# Patient Record
Sex: Female | Born: 1940 | Race: White | Hispanic: No | Marital: Married | Smoking: Never smoker
Health system: Southern US, Community
[De-identification: ages and names within clinical notes are randomized; demographics above are authoritative.]

## PROBLEM LIST (undated history)

## (undated) DIAGNOSIS — E079 Disorder of thyroid, unspecified: Secondary | ICD-10-CM

## (undated) DIAGNOSIS — R0600 Dyspnea, unspecified: Secondary | ICD-10-CM

## (undated) DIAGNOSIS — R011 Cardiac murmur, unspecified: Secondary | ICD-10-CM

## (undated) DIAGNOSIS — C801 Malignant (primary) neoplasm, unspecified: Secondary | ICD-10-CM

## (undated) DIAGNOSIS — F32A Depression, unspecified: Secondary | ICD-10-CM

## (undated) DIAGNOSIS — I214 Non-ST elevation (NSTEMI) myocardial infarction: Secondary | ICD-10-CM

## (undated) DIAGNOSIS — F419 Anxiety disorder, unspecified: Secondary | ICD-10-CM

## (undated) DIAGNOSIS — F329 Major depressive disorder, single episode, unspecified: Secondary | ICD-10-CM

## (undated) DIAGNOSIS — I2510 Atherosclerotic heart disease of native coronary artery without angina pectoris: Secondary | ICD-10-CM

## (undated) DIAGNOSIS — H9190 Unspecified hearing loss, unspecified ear: Secondary | ICD-10-CM

## (undated) DIAGNOSIS — E039 Hypothyroidism, unspecified: Secondary | ICD-10-CM

## (undated) DIAGNOSIS — M1990 Unspecified osteoarthritis, unspecified site: Secondary | ICD-10-CM

## (undated) DIAGNOSIS — E785 Hyperlipidemia, unspecified: Secondary | ICD-10-CM

## (undated) HISTORY — PX: APPENDECTOMY: SHX54

## (undated) HISTORY — DX: Hyperlipidemia, unspecified: E78.5

## (undated) HISTORY — DX: Dyspnea, unspecified: R06.00

## (undated) HISTORY — DX: Disorder of thyroid, unspecified: E07.9

## (undated) HISTORY — PX: CHOLECYSTECTOMY: SHX55

## (undated) HISTORY — DX: Anxiety disorder, unspecified: F41.9

---

## 2007-12-22 ENCOUNTER — Ambulatory Visit: Payer: Self-pay | Admitting: Obstetrics & Gynecology

## 2007-12-22 ENCOUNTER — Other Ambulatory Visit: Admission: RE | Admit: 2007-12-22 | Discharge: 2007-12-22 | Payer: Self-pay | Admitting: Obstetrics & Gynecology

## 2007-12-22 ENCOUNTER — Encounter: Payer: Self-pay | Admitting: Obstetrics & Gynecology

## 2008-01-08 ENCOUNTER — Ambulatory Visit: Payer: Self-pay | Admitting: Obstetrics & Gynecology

## 2008-01-22 ENCOUNTER — Ambulatory Visit: Payer: Self-pay | Admitting: Obstetrics & Gynecology

## 2008-04-05 ENCOUNTER — Encounter: Payer: Self-pay | Admitting: Obstetrics & Gynecology

## 2008-04-05 ENCOUNTER — Ambulatory Visit (HOSPITAL_COMMUNITY): Admission: RE | Admit: 2008-04-05 | Discharge: 2008-04-05 | Payer: Self-pay | Admitting: Obstetrics & Gynecology

## 2008-04-05 ENCOUNTER — Ambulatory Visit: Payer: Self-pay | Admitting: Obstetrics & Gynecology

## 2008-05-04 ENCOUNTER — Ambulatory Visit: Payer: Self-pay | Admitting: Obstetrics & Gynecology

## 2010-06-06 LAB — CBC
HCT: 41.4 % (ref 36.0–46.0)
Hemoglobin: 14 g/dL (ref 12.0–15.0)
MCHC: 33.9 g/dL (ref 30.0–36.0)
MCV: 91.1 fL (ref 78.0–100.0)
Platelets: 211 10*3/uL (ref 150–400)
RBC: 4.55 MIL/uL (ref 3.87–5.11)
RDW: 13.6 % (ref 11.5–15.5)
WBC: 6.8 10*3/uL (ref 4.0–10.5)

## 2010-07-04 NOTE — Op Note (Signed)
NAME:  Bonnie Hancock, Bonnie Hancock                ACCOUNT NO.:  [id number]   MEDICAL RECORD NO.:  [id number]          PATIENT TYPE:  AMB   LOCATION:  [facility]                           FACILITY:  [facility]   PHYSICIAN:  Allie Bossier, MD        DATE OF BIRTH:  20-Jan-1941   DATE OF PROCEDURE:  04/05/2008  DATE OF DISCHARGE:                               OPERATIVE REPORT   PREOPERATIVE DIAGNOSIS:  Postmenopausal bleeding.   POSTOPERATIVE DIAGNOSES:  1. Postmenopausal bleeding.  2. Obesity.   PROCEDURE:  Hysteroscopy, dilation and curettage.   SURGEON:  Myra C. Marice Potter, MD   ANESTHESIA:  LMA.   COMPLICATIONS:  None.   ESTIMATED BLOOD LOSS:  Minimal.   SPECIMENS:  Uterine curettings.   FINDINGS:  Polyp.   DETAILS OF PROCEDURE AND FINDINGS:  The risks, benefits, and  alternatives of surgery were explained, understood, and accepted.  Consents were signed.  She was taken to the operating room and general  anesthesia was applied without complication.  She was placed in dorsal  lithotomy position and her vagina was prepped and draped in usual  sterile fashion.  A bladder was emptied with a Robinson catheter.  A  weighted speculum was placed posteriorly and a Deaver anteriorly.  This  was done after bimanual exam revealed a normal size and shape mid plane  uterus and nonenlarged adnexa.  A single-tooth tenaculum was placed in  the anterior lip of the cervix.  The cervix was amazingly easy to dilate  using Pratt dilators up to a 23-French to accommodate a diagnostic  hysteroscope.  Hysteroscopy revealed an atrophic endometrium throughout  with the exception of an anterior wall uterine polyp.  Using a sharp  curettage, the polyp was removed.  Hysteroscopy confirmed complete  removal of the polyp.  Tenaculum was removed.  No bleeding was noted  from the tenaculum site or the cervix.  She was extubated and taken to  recovery room in stable condition.  The instrument, sponge, and needle  counts were  correct.      Allie Bossier, MD  Electronically Signed    MCD/MEDQ  D:  04/05/2008  T:  04/06/2008  Job:  [id number]

## 2010-07-04 NOTE — Assessment & Plan Note (Signed)
NAME:  NEVAEN, TREDWAY NO.:  [id number]   MEDICAL RECORD NO.:  [id number]          PATIENT TYPE:  POB   LOCATION:  [facility]         FACILITY:  [facility]   PHYSICIAN:  Allie Bossier, MD        DATE OF BIRTH:  1940/11/22   DATE OF SERVICE:  12/22/2007                                  CLINIC NOTE   IDENTIFICATION:  Ms. Spath is a 70 year old married white gravida 4,  para 4 (with 15+ grand kids) who is here for annual exam.  Her complaint  is that of an episode of postmenopausal bleeding 3-4 months ago.  She  has not had this evaluated.  Of note, her last Pap smear was probably  around the year 2003 and her last mammogram around the year 2004.  With  regard to colonoscopy she says, I have never had it, I'm not going  to.Marland Kitchen   PAST MEDICAL HISTORY:  Morbid obesity, high cholesterol (she says no  medicines).  She is not able to tolerate any of the medicines for this.   OTHER MEDICAL PROBLEM:  Mixed urinary incontinence and postmenopausal  bleeding.   REVIEW OF SYSTEMS:  She has seen a chiropractor in the past for her  lower back pain, but not recently.  Her internal medicine doctor is Dr.  __________ Nicholes Rough.  She has been married (third marriage) for the  last 23 years.  She is sexually active and uses a lubricant as  necessary.   PAST SURGICAL HISTORY:  Appendectomy and tubal ligation.   FAMILY HISTORY:  Negative for breast, GYN, and colon malignancies.   SOCIAL HISTORY:  Negative for tobacco, alcohol, or drug use.   ALLERGIES:  No known drugs allergies.  No latex allergies.   PHYSICAL EXAMINATION:  VITAL SIGNS:  Weight 200, height 5 feet 2 inches,  blood pressure 153/86, pulse 71.  HEENT:  Normal.  HEART:  Regular rate and rhythm.  LUNGS:  Clear to auscultation bilaterally.  BREAST:  Normal.  No skin lesions, nipple discharge, or masses.  ABDOMEN:  Obese, benign.  No palpable hepatosplenomegaly.  EXTERNAL GENITALIA:  Remarkably little irritation,  as it is obvious that  she leaks urine.  Vagina with moderate amount of atrophy with normal  cervix, parous, no lesions.  On the anterior vaginal wall just above the  portion of the cervix there is a discolored area of the vaginal skin.  (I did a Pap smear of this area for cytology).  On bimanual exam uterus  feels slightly enlarged.  Her adnexa are not palpable and there is no  tenderness on any part on the bimanual exam.   ASSESSMENT AND PLAN:  1. Annual exam, check the Pap smear of the uncertain vaginal area and      the cervix.  2. Recommended self-breast exam, monthly.  She is aware that weight      loss would be advantageous to her.  3. Postmenopausal bleeding, 3 months ago.  I will schedule a GYN      ultrasound for further evaluation and I have scheduled a mammogram.      Allie Bossier, MD  MCD/MEDQ  D:  12/22/2007  T:  12/23/2007  Job:  [id number]

## 2010-08-14 ENCOUNTER — Ambulatory Visit: Payer: Self-pay | Admitting: Internal Medicine

## 2010-08-29 ENCOUNTER — Encounter: Payer: Self-pay | Admitting: Cardiovascular Disease

## 2010-08-29 ENCOUNTER — Ambulatory Visit (INDEPENDENT_AMBULATORY_CARE_PROVIDER_SITE_OTHER): Payer: Medicare Other | Admitting: Cardiovascular Disease

## 2010-08-29 VITALS — BP 158/82 | HR 73 | Ht 62.0 in | Wt 197.0 lb

## 2010-08-29 DIAGNOSIS — E785 Hyperlipidemia, unspecified: Secondary | ICD-10-CM

## 2010-08-29 DIAGNOSIS — E782 Mixed hyperlipidemia: Secondary | ICD-10-CM | POA: Insufficient documentation

## 2010-08-29 DIAGNOSIS — E669 Obesity, unspecified: Secondary | ICD-10-CM | POA: Insufficient documentation

## 2010-08-29 DIAGNOSIS — R0602 Shortness of breath: Secondary | ICD-10-CM | POA: Insufficient documentation

## 2010-08-29 NOTE — Assessment & Plan Note (Signed)
She does report a history of hyperlipidemia. She is uncertain of her numbers. We will try to obtain the most recent labs for our records before making any further recommendations.

## 2010-08-29 NOTE — Assessment & Plan Note (Signed)
Etiology of her shortness of breath is uncertain. Clinically, she appears well with no signs of heart failure. EKG is essentially normal. We will order an echocardiogram to exclude underlying cardiac pathology. If this is normal, we would likely suggest a stress test. We will call her with the results of her echocardiogram. We'll also suggested we check a CBC to make sure that she is not anemic.

## 2010-08-29 NOTE — Patient Instructions (Addendum)
  Please call us if you have new issues that need to be addressed before your next appt.   Your physician has requested that you have an echocardiogram. Echocardiography is a painless test that uses sound waves to create images of your heart. It provides your doctor with information about the size and shape of your heart and how well your heart's chambers and valves are working. This procedure takes approximately one hour. There are no restrictions for this procedure.  Your physician recommends that you return for lab work in: Same day as your Echo (CBC)

## 2010-08-29 NOTE — Assessment & Plan Note (Signed)
She denies any recent change in her weight. She does have underlying obesity. Certainly this could contribute to shortness of breath. We have encouraged continued exercise, careful diet management in an effort to lose weight.

## 2010-08-29 NOTE — Progress Notes (Signed)
   Patient ID: Bonnie Hancock, female    DOB: [birth year], 70 y.o.   MRN: [id number]  HPI Comments: Bonnie Hancock is a 70 year old woman, patient of Dr. Arlana Pouch, presenting by referral for evaluation of shortness of breath.  She reports that she has had shortness of breath for at least one month. It seemed to come on slowly but was certainly a change from before. She was typically very active, able to walk around [employer] without any problems. Reportedly, she had worsening shortness of breath, most notably with exertion. She is unable to walk around as much as before. She denies any significant weight change, no lower extremity edema. No cough. She denies any previous cardiac history or lung problem. She has never smoked. She denies any chest pain, lightheadedness or dizziness. She is able to walk though has to go much more slowly than before.  By report, EKG and previous chest x-ray have been normal. She reports that she sleeps well on her sleeping pill.  EKG today shows normal sinus rhythm with rate 66 beats per minute with no significant ST or T wave changes  Outpatient Encounter Prescriptions as of 08/29/2010  Medication Sig Dispense Refill  . LORazepam (ATIVAN) 1 MG tablet Take 1 mg by mouth every 8 (eight) hours as needed.            Review of Systems  Constitutional: Negative.   HENT: Negative.   Eyes: Negative.   Respiratory: Positive for shortness of breath.   Cardiovascular: Negative.   Gastrointestinal: Negative.   Musculoskeletal: Negative.   Skin: Negative.   Neurological: Negative.   Hematological: Negative.   Psychiatric/Behavioral: Negative.   All other systems reviewed and are negative.    BP 158/82  Pulse 73  Ht 5\' 2"  (1.575 m)  Wt 197 lb (89.359 kg)  BMI 36.03 kg/m2  Physical Exam  Nursing note and vitals reviewed. Constitutional: She is oriented to person, place, and time. She appears well-developed and well-nourished.       Obese   HENT:  Head: Normocephalic.    Nose: Nose normal.  Mouth/Throat: Oropharynx is clear and moist.  Eyes: Conjunctivae are normal. Pupils are equal, round, and reactive to light.  Neck: Normal range of motion. Neck supple. No JVD present.  Cardiovascular: Normal rate, regular rhythm, S1 normal, S2 normal, normal heart sounds and intact distal pulses.  Exam reveals no gallop and no friction rub.   No murmur heard. Pulmonary/Chest: Effort normal and breath sounds normal. No respiratory distress. She has no wheezes. She has no rales. She exhibits no tenderness.  Abdominal: Soft. Bowel sounds are normal. She exhibits no distension. There is no tenderness.  Musculoskeletal: Normal range of motion. She exhibits no edema and no tenderness.  Lymphadenopathy:    She has no cervical adenopathy.  Neurological: She is alert and oriented to person, place, and time. Coordination normal.  Skin: Skin is warm and dry. No rash noted. No erythema.  Psychiatric: She has a normal mood and affect. Her behavior is normal. Judgment and thought content normal.         Assessment and Plan

## 2010-09-05 ENCOUNTER — Other Ambulatory Visit (INDEPENDENT_AMBULATORY_CARE_PROVIDER_SITE_OTHER): Payer: Medicare Other | Admitting: *Deleted

## 2010-09-05 DIAGNOSIS — R0602 Shortness of breath: Secondary | ICD-10-CM

## 2010-09-05 LAB — CBC WITH DIFFERENTIAL/PLATELET
Basophils Absolute: 0 10*3/uL (ref 0.0–0.1)
Basophils Relative: 1 % (ref 0–1)
Eosinophils Absolute: 0.1 10*3/uL (ref 0.0–0.7)
Eosinophils Relative: 2 % (ref 0–5)
HCT: 43.3 % (ref 36.0–46.0)
Hemoglobin: 13.8 g/dL (ref 12.0–15.0)
Lymphocytes Relative: 34 % (ref 12–46)
Lymphs Abs: 2.2 10*3/uL (ref 0.7–4.0)
MCH: 30.4 pg (ref 26.0–34.0)
MCHC: 31.9 g/dL (ref 30.0–36.0)
MCV: 95.4 fL (ref 78.0–100.0)
Monocytes Absolute: 0.5 10*3/uL (ref 0.1–1.0)
Monocytes Relative: 7 % (ref 3–12)
Neutro Abs: 3.7 10*3/uL (ref 1.7–7.7)
Neutrophils Relative %: 56 % (ref 43–77)
Platelets: 217 10*3/uL (ref 150–400)
RBC: 4.54 MIL/uL (ref 3.87–5.11)
RDW: 14 % (ref 11.5–15.5)
WBC: 6.5 10*3/uL (ref 4.0–10.5)

## 2010-09-07 ENCOUNTER — Telehealth: Payer: Self-pay | Admitting: *Deleted

## 2010-09-07 MED ORDER — FUROSEMIDE 20 MG PO TABS: 20.0000 mg | ORAL_TABLET | Freq: Every day | ORAL | Status: DC

## 2010-09-07 MED ORDER — POTASSIUM CHLORIDE ER 10 MEQ PO CPCR: 20.0000 meq | ORAL_CAPSULE | Freq: Every day | ORAL | Status: DC

## 2010-09-07 NOTE — Telephone Encounter (Signed)
Message copied by Annia Belt on Thu Sep 07, 2010  2:14 PM ------      Message from: Phoebe Sharps      Created: Wed Sep 06, 2010  9:07 PM       Echo shows elevated pressures.       This may explain sob.      We should try lasix 20 mg daily, restrict water intake.      Take with potassium.      Monitor weight      CBC is normal. No anemia

## 2010-09-07 NOTE — Telephone Encounter (Signed)
Notified pt of msg below regarding results. She will start lasix 20mg  daily with potassium and will limit fluid intake and monitor weight. Advised pt if she drops significant amount of weight in short period to call the office because we may need to draw labs to make sure she's not too dry. Dr. Mariah Milling, when do you want pt to f/u? And when do you want me to repeat labwork? She does not have a BMP in her chart to compare previous potassium. Thanks.

## 2010-09-08 NOTE — Telephone Encounter (Signed)
Notified patient need to come for a BMET on September 22, 2010 and follow up with Dr. Mariah Milling on September 28, 2010.

## 2010-09-08 NOTE — Telephone Encounter (Signed)
Would maybe follow up in several weeks (2 to 3)

## 2010-09-22 ENCOUNTER — Ambulatory Visit (INDEPENDENT_AMBULATORY_CARE_PROVIDER_SITE_OTHER): Payer: Medicare Other | Admitting: *Deleted

## 2010-09-22 DIAGNOSIS — R0602 Shortness of breath: Secondary | ICD-10-CM

## 2010-09-23 LAB — BASIC METABOLIC PANEL
BUN: 17 mg/dL (ref 6–23)
CO2: 16 mEq/L — ABNORMAL LOW (ref 19–32)
Calcium: 9.6 mg/dL (ref 8.4–10.5)
Chloride: 109 mEq/L (ref 96–112)
Creat: 0.89 mg/dL (ref 0.50–1.10)
Glucose, Bld: 157 mg/dL — ABNORMAL HIGH (ref 70–99)
Potassium: 4.8 mEq/L (ref 3.5–5.3)
Sodium: 141 mEq/L (ref 135–145)

## 2010-09-28 ENCOUNTER — Ambulatory Visit (INDEPENDENT_AMBULATORY_CARE_PROVIDER_SITE_OTHER): Payer: Medicare Other | Admitting: Cardiovascular Disease

## 2010-09-28 ENCOUNTER — Encounter: Payer: Self-pay | Admitting: Cardiovascular Disease

## 2010-09-28 DIAGNOSIS — E785 Hyperlipidemia, unspecified: Secondary | ICD-10-CM

## 2010-09-28 DIAGNOSIS — R0602 Shortness of breath: Secondary | ICD-10-CM

## 2010-09-28 DIAGNOSIS — E669 Obesity, unspecified: Secondary | ICD-10-CM

## 2010-09-28 MED ORDER — PRAVASTATIN SODIUM 20 MG PO TABS: 20.0000 mg | ORAL_TABLET | Freq: Every evening | ORAL | Status: DC

## 2010-09-28 MED ORDER — ASPIRIN EC 81 MG PO TBEC: 81.0000 mg | DELAYED_RELEASE_TABLET | Freq: Every day | ORAL | Status: AC

## 2010-09-28 NOTE — Patient Instructions (Signed)
You are doing well. No medication changes were made. Please call us if you have new issues that need to be addressed before your next appt.  We will call you for a follow up Appt. In 12 months  

## 2010-09-28 NOTE — Progress Notes (Signed)
   Patient ID: Bonnie Hancock, female    DOB: 09-02-1940, 70 y.o.   MRN: [id number]  HPI Comments: Ms. Bonnie Hancock is a 70 year old woman, patient of Dr. Arlana Pouch, With shortness of breath with exertion, obesity, presenting for routine followup.  She is currently on Lasix with potassium. She's had no significant decrease in her weight. She does go out to eat frequently but does not add additional salt to her food. Her weight continues to be a problem. She does not do any regular exercise. She tries to keep up with her husband who is recovering from bypass surgery. He is doing more than her and she has to stop secondary to shortness of breath. "I have always been heavy".  She denies any significant lower extremity edema. Shortness of breath is about the same. No smoking history  Echocardiogram done in July 2012 shows normal LV systolic function, normal valvular function with no significant disease, mildly elevated right ventricular systolic pressures consistent with mild pulmonary hypertension.  Old EKG today shows normal sinus rhythm with rate 66 beats per minute with no significant ST or T wave changes  Outpatient Encounter Prescriptions as of 09/28/2010  Medication Sig Dispense Refill  . furosemide (LASIX) 20 MG tablet Take 1 tablet (20 mg total) by mouth daily.  30 tablet  6  . LORazepam (ATIVAN) 1 MG tablet Take 1 mg by mouth every 8 (eight) hours as needed.        . potassium chloride (MICRO-K) 10 MEQ CR capsule Take 2 capsules (20 mEq total) by mouth daily.  60 capsule  6    Review of Systems  Constitutional: Negative.   HENT: Negative.   Eyes: Negative.   Respiratory: Positive for shortness of breath.   Cardiovascular: Negative.   Gastrointestinal: Negative.   Musculoskeletal: Negative.   Skin: Negative.   Neurological: Negative.   Hematological: Negative.   Psychiatric/Behavioral: Negative.   All other systems reviewed and are negative.    BP 140/74  Pulse 81  Ht 5\' 2"  (1.575 m)   Wt 197 lb (89.359 kg)  BMI 36.03 kg/m2  Physical Exam  Nursing note and vitals reviewed. Constitutional: She is oriented to person, place, and time. She appears well-developed and well-nourished.       Obese   HENT:  Head: Normocephalic.  Nose: Nose normal.  Mouth/Throat: Oropharynx is clear and moist.  Eyes: Conjunctivae are normal. Pupils are equal, round, and reactive to light.  Neck: Normal range of motion. Neck supple. No JVD present.  Cardiovascular: Normal rate, regular rhythm, S1 normal, S2 normal, normal heart sounds and intact distal pulses.  Exam reveals no gallop and no friction rub.   No murmur heard. Pulmonary/Chest: Effort normal and breath sounds normal. No respiratory distress. She has no wheezes. She has no rales. She exhibits no tenderness.  Abdominal: Soft. Bowel sounds are normal. She exhibits no distension. There is no tenderness.  Musculoskeletal: Normal range of motion. She exhibits no edema and no tenderness.  Lymphadenopathy:    She has no cervical adenopathy.  Neurological: She is alert and oriented to person, place, and time. Coordination normal.  Skin: Skin is warm and dry. No rash noted. No erythema.  Psychiatric: She has a normal mood and affect. Her behavior is normal. Judgment and thought content normal.         Assessment and Plan

## 2010-09-28 NOTE — Assessment & Plan Note (Signed)
I believe her shortness of breath is multifactorial. She is deconditioned, obese and has mildly elevated right ventricular systolic pressures. She denies any snoring or sleep apnea. She is on a diuretic but no significant change in her weight to suggest even mild diuresis. I suggested she watch her salt intake and fluid intake in an effort to decrease her weight by several pounds. This could help her breathing.   I've also strongly suggested she continue her exercise as her husband does his cardiac rehabilitation. She needs to work on her weight and deconditioning. This would likely help her shortness of breath.  If she has worsening symptoms, worsening shortness of breath, we have suggested she contact us. We could then schedule her for a stress test. She will be low risk for coronary artery disease as she is not a smoker, not a diabetic.

## 2010-09-28 NOTE — Assessment & Plan Note (Signed)
She is willing to try low-dose pravastatin. He suggested she start 10 mg daily for several weeks for titrating to 20 mg daily.

## 2010-09-28 NOTE — Assessment & Plan Note (Signed)
We have encouraged continued exercise, careful diet management in an effort to lose weight. 

## 2011-01-24 ENCOUNTER — Ambulatory Visit: Payer: Self-pay | Admitting: Internal Medicine

## 2011-03-01 ENCOUNTER — Telehealth: Payer: Self-pay | Admitting: Cardiovascular Disease

## 2011-03-01 NOTE — Telephone Encounter (Signed)
Patient states that she is having "kidney pain" and wants to discuss her medications.

## 2011-03-02 NOTE — Telephone Encounter (Signed)
Spoke to pt this AM, her medlist is updated. C/o left flank pain for past 2 days, pain is intermittent and worse with movement.  Pt asked if could be related to any of her meds. She has not started anything new recently. Her fluid intake is good she says, no h/o kidney stone, she has had kidney infection in past and thinks this may be cause. She says she has f/u scheduled with Dr. Arlana Pouch today at 2pm and they will call our office if needed for any cardiac reason.

## 2011-03-07 ENCOUNTER — Ambulatory Visit: Payer: Self-pay | Admitting: Internal Medicine

## 2011-04-10 ENCOUNTER — Ambulatory Visit: Payer: Self-pay | Admitting: Urology

## 2011-05-01 ENCOUNTER — Other Ambulatory Visit: Payer: Self-pay | Admitting: Cardiovascular Disease

## 2011-09-11 ENCOUNTER — Ambulatory Visit: Payer: Self-pay | Admitting: General Surgery

## 2011-09-25 ENCOUNTER — Ambulatory Visit (INDEPENDENT_AMBULATORY_CARE_PROVIDER_SITE_OTHER): Payer: Medicare Other | Admitting: Cardiovascular Disease

## 2011-09-25 ENCOUNTER — Encounter: Payer: Self-pay | Admitting: Cardiovascular Disease

## 2011-09-25 VITALS — BP 140/70 | HR 72 | Ht 62.0 in | Wt 200.2 lb

## 2011-09-25 DIAGNOSIS — R0602 Shortness of breath: Secondary | ICD-10-CM

## 2011-09-25 DIAGNOSIS — E669 Obesity, unspecified: Secondary | ICD-10-CM

## 2011-09-25 DIAGNOSIS — R0989 Other specified symptoms and signs involving the circulatory and respiratory systems: Secondary | ICD-10-CM

## 2011-09-25 DIAGNOSIS — E785 Hyperlipidemia, unspecified: Secondary | ICD-10-CM

## 2011-09-25 DIAGNOSIS — R0609 Other forms of dyspnea: Secondary | ICD-10-CM

## 2011-09-25 DIAGNOSIS — R0600 Dyspnea, unspecified: Secondary | ICD-10-CM

## 2011-09-25 NOTE — Assessment & Plan Note (Signed)
She reports that she has tried statins in the past. She will try red yeast rice.

## 2011-09-25 NOTE — Progress Notes (Signed)
   Patient ID: Bonnie Hancock, female    DOB: 1940/11/24, 71 y.o.   MRN: [id number]  HPI Comments: Bonnie Hancock is a 71 year old woman, patient of Dr. Arlana Pouch, With history of shortness of breath with exertion, obesity, presenting for routine followup.  She is currently on Lasix twice a day with potassium. She's had no significant decrease in her weight. Her weight continues to be a problem. She does not do any regular exercise.  She reports that her shortness of breath is better than on her previous visit, possibly from the diuretic. She denies any significant lower extremity edema. No smoking history.  Echocardiogram done in July 2012 shows normal LV systolic function, normal valvular function with no significant disease, mildly elevated right ventricular systolic pressures consistent with mild pulmonary hypertension.   EKG today shows normal sinus rhythm with rate 67 beats per minute with no significant ST or T wave changes  Outpatient Encounter Prescriptions as of 09/25/2011  Medication Sig Dispense Refill  . aspirin EC 81 MG tablet Take 1 tablet (81 mg total) by mouth daily.  150 tablet  2  . furosemide (LASIX) 20 MG tablet Take 20 mg by mouth 2 (two) times daily.      Marland Kitchen LORazepam (ATIVAN) 1 MG tablet Take 1 mg by mouth every 8 (eight) hours as needed.        . potassium chloride (MICRO-K) 10 MEQ CR capsule TAKE TWO CAPSULES BY MOUTH EVERY DAY  60 capsule  5  . furosemide (LASIX) 20 MG tablet Take 20 mg by mouth 2 (two) times daily.        Review of Systems  Constitutional: Negative.   HENT: Negative.   Eyes: Negative.   Respiratory: Positive for shortness of breath.   Cardiovascular: Negative.   Gastrointestinal: Negative.   Musculoskeletal: Negative.   Skin: Negative.   Neurological: Negative.   Hematological: Negative.   Psychiatric/Behavioral: Negative.   All other systems reviewed and are negative.    BP 140/70  Pulse 72  Ht 5\' 2"  (1.575 m)  Wt 200 lb 4 oz (90.833 kg)  BMI  36.63 kg/m2  Physical Exam  Nursing note and vitals reviewed. Constitutional: She is oriented to person, place, and time. She appears well-developed and well-nourished.       Obese   HENT:  Head: Normocephalic.  Nose: Nose normal.  Mouth/Throat: Oropharynx is clear and moist.  Eyes: Conjunctivae are normal. Pupils are equal, round, and reactive to light.  Neck: Normal range of motion. Neck supple. No JVD present.  Cardiovascular: Normal rate, regular rhythm, S1 normal, S2 normal, normal heart sounds and intact distal pulses.  Exam reveals no gallop and no friction rub.   No murmur heard. Pulmonary/Chest: Effort normal and breath sounds normal. No respiratory distress. She has no wheezes. She has no rales. She exhibits no tenderness.  Abdominal: Soft. Bowel sounds are normal. She exhibits no distension. There is no tenderness.  Musculoskeletal: Normal range of motion. She exhibits no edema and no tenderness.  Lymphadenopathy:    She has no cervical adenopathy.  Neurological: She is alert and oriented to person, place, and time. Coordination normal.  Skin: Skin is warm and dry. No rash noted. No erythema.  Psychiatric: She has a normal mood and affect. Her behavior is normal. Judgment and thought content normal.         Assessment and Plan

## 2011-09-25 NOTE — Assessment & Plan Note (Signed)
Shortness of breath has improved on Lasix twice a day. She reports that she has followup with him for routine blood work. I suspect she has a diastolic dysfunction and diastolic CHF, improved on Lasix.

## 2011-09-25 NOTE — Assessment & Plan Note (Signed)
We have encouraged continued exercise, careful diet management in an effort to lose weight. 

## 2011-09-25 NOTE — Patient Instructions (Addendum)
You are doing well. No medication changes were made. Try RED YEAST RICE  2 to 4 a day for cholesterol   Please call us if you have new issues that need to be addressed before your next appt.  Your physician wants you to follow-up in: 12 months.  You will receive a reminder letter in the mail two months in advance. If you don't receive a letter, please call our office to schedule the follow-up appointment.

## 2011-11-04 ENCOUNTER — Other Ambulatory Visit: Payer: Self-pay | Admitting: Cardiovascular Disease

## 2011-11-05 ENCOUNTER — Other Ambulatory Visit: Payer: Self-pay | Admitting: *Deleted

## 2011-11-05 MED ORDER — POTASSIUM CHLORIDE ER 10 MEQ PO CPCR: ORAL_CAPSULE | ORAL | Status: DC

## 2011-11-05 NOTE — Telephone Encounter (Signed)
Refilled Potassium

## 2012-05-05 ENCOUNTER — Other Ambulatory Visit: Payer: Self-pay | Admitting: Cardiovascular Disease

## 2012-05-05 NOTE — Telephone Encounter (Signed)
Refilled Potassium sent to [facility].

## 2012-07-23 ENCOUNTER — Other Ambulatory Visit: Payer: Self-pay | Admitting: Cardiovascular Disease

## 2012-07-30 ENCOUNTER — Other Ambulatory Visit: Payer: Self-pay

## 2012-07-30 MED ORDER — POTASSIUM CHLORIDE ER 10 MEQ PO CPCR: ORAL_CAPSULE | ORAL | Status: DC

## 2012-07-30 NOTE — Telephone Encounter (Signed)
Refilled potassium 10 meq sent to [facility] pharmacy.

## 2012-09-22 ENCOUNTER — Ambulatory Visit (INDEPENDENT_AMBULATORY_CARE_PROVIDER_SITE_OTHER): Payer: Medicare Other | Admitting: Cardiovascular Disease

## 2012-09-22 ENCOUNTER — Encounter: Payer: Self-pay | Admitting: Cardiovascular Disease

## 2012-09-22 VITALS — BP 134/74 | HR 59 | Ht 62.0 in | Wt 198.8 lb

## 2012-09-22 DIAGNOSIS — E669 Obesity, unspecified: Secondary | ICD-10-CM

## 2012-09-22 DIAGNOSIS — E785 Hyperlipidemia, unspecified: Secondary | ICD-10-CM

## 2012-09-22 DIAGNOSIS — R0602 Shortness of breath: Secondary | ICD-10-CM

## 2012-09-22 MED ORDER — FENOFIBRATE 160 MG PO TABS: 160.0000 mg | ORAL_TABLET | Freq: Every day | ORAL | Status: DC

## 2012-09-22 NOTE — Patient Instructions (Addendum)
You are doing well. Please start fenofibrate one a day  Please call us if you have new issues that need to be addressed before your next appt.  Your physician wants you to follow-up in: 12 months.  You will receive a reminder letter in the mail two months in advance. If you don't receive a letter, please call our office to schedule the follow-up appointment.

## 2012-09-22 NOTE — Progress Notes (Signed)
Patient ID: Bonnie Hancock, female    DOB: Jun 15, 1940, 72 y.o.   MRN: [id number]  HPI Comments: Bonnie Hancock is a 72 year old woman, patient of Dr. Arlana Pouch, With history of shortness of breath with exertion, obesity, presenting for routine followup.  She is currently on Lasix twice a day with potassium. Weight continues to be a problem. Since taking the Lasix, her shortness of breath has improved.   She does not do any regular exercise.  She denies any significant lower extremity edema. No smoking history.  She was unable to tolerate any statins. Recent lab work shows total cholesterol 295, LDL 215, TSH 5  Echocardiogram done in July 2012 shows normal LV systolic function, normal valvular function with no significant disease, mildly elevated right ventricular systolic pressures consistent with mild pulmonary hypertension.   EKG today shows normal sinus rhythm with rate 59 beats per minute with no significant ST or T wave changes  Outpatient Encounter Prescriptions as of 09/22/2012  Medication Sig Dispense Refill  . aspirin 81 MG tablet Take 81 mg by mouth daily.      Marland Kitchen escitalopram (LEXAPRO) 10 MG tablet Take 5 mg by mouth daily.      . furosemide (LASIX) 20 MG tablet Take 20 mg by mouth 2 (two) times daily.      Marland Kitchen LORazepam (ATIVAN) 1 MG tablet Take 0.5 mg by mouth every 8 (eight) hours as needed.       . Melatonin 5 MG CAPS Take 5 mg by mouth at bedtime.      . potassium chloride (MICRO-K) 10 MEQ CR capsule Take 10 mEq by mouth 2 (two) times daily.      . [DISCONTINUED] potassium chloride (MICRO-K) 10 MEQ CR capsule TAKE TWO CAPSULES BY MOUTH ONCE DAILY  60 capsule  3  . [DISCONTINUED] furosemide (LASIX) 20 MG tablet Take 20 mg by mouth 2 (two) times daily.       No facility-administered encounter medications on file as of 09/22/2012.     Review of Systems  Constitutional: Negative.   HENT: Negative.   Eyes: Negative.   Respiratory: Positive for shortness of breath.   Cardiovascular:  Negative.   Gastrointestinal: Negative.   Musculoskeletal: Negative.   Skin: Negative.   Neurological: Negative.   Psychiatric/Behavioral: Negative.   All other systems reviewed and are negative.    BP 134/74  Pulse 59  Ht 5\' 2"  (1.575 m)  Wt 198 lb 12 oz (90.152 kg)  BMI 36.34 kg/m2  Physical Exam  Nursing note and vitals reviewed. Constitutional: She is oriented to person, place, and time. She appears well-developed and well-nourished.  Obese   HENT:  Head: Normocephalic.  Nose: Nose normal.  Mouth/Throat: Oropharynx is clear and moist.  Eyes: Conjunctivae are normal. Pupils are equal, round, and reactive to light.  Neck: Normal range of motion. Neck supple. No JVD present.  Cardiovascular: Normal rate, regular rhythm, S1 normal, S2 normal, normal heart sounds and intact distal pulses.  Exam reveals no gallop and no friction rub.   No murmur heard. Pulmonary/Chest: Effort normal and breath sounds normal. No respiratory distress. She has no wheezes. She has no rales. She exhibits no tenderness.  Abdominal: Soft. Bowel sounds are normal. She exhibits no distension. There is no tenderness.  Musculoskeletal: Normal range of motion. She exhibits no edema and no tenderness.  Lymphadenopathy:    She has no cervical adenopathy.  Neurological: She is alert and oriented to person, place, and time. Coordination normal.  Skin:  Skin is warm and dry. No rash noted. No erythema.  Psychiatric: She has a normal mood and affect. Her behavior is normal. Judgment and thought content normal.    Assessment and Plan

## 2012-09-22 NOTE — Assessment & Plan Note (Signed)
We have encouraged continued exercise, careful diet management in an effort to lose weight. 

## 2012-09-22 NOTE — Assessment & Plan Note (Signed)
Talked to her about her hyperlipidemia today. Encouraged diet, weight loss. She does not want a statin. Discussed other options including fenofibrate, zetia, and WelChol or colestipol. She is willing to start on fenofibrate once daily. If well tolerated, could then try colestipol (may be cheaper than WelChol).

## 2012-09-22 NOTE — Assessment & Plan Note (Signed)
Shortness of breath is mild, chronic, stable. No further workup at this time. No changes from her prior clinic visit.

## 2013-01-05 ENCOUNTER — Other Ambulatory Visit: Payer: Self-pay | Admitting: *Deleted

## 2013-01-05 ENCOUNTER — Other Ambulatory Visit: Payer: Self-pay | Admitting: Cardiovascular Disease

## 2013-01-05 MED ORDER — POTASSIUM CHLORIDE ER 10 MEQ PO CPCR: 10.0000 meq | ORAL_CAPSULE | Freq: Two times a day (BID) | ORAL | Status: DC

## 2013-01-05 NOTE — Telephone Encounter (Signed)
Requested Prescriptions   Signed Prescriptions Disp Refills  . potassium chloride (MICRO-K) 10 MEQ CR capsule 60 capsule 3    Sig: Take 1 capsule (10 mEq total) by mouth 2 (two) times daily.    Authorizing Provider: Antonieta Iba    Ordering User: Kendrick Fries

## 2013-03-09 ENCOUNTER — Telehealth: Payer: Self-pay

## 2013-03-09 NOTE — Telephone Encounter (Signed)
Patient needs to have a different type of potassium pill sent in because the potassium she is taking now will cost her $56.00 a month where she did pay $8.00 month. Please advise what other potassium she can take?

## 2013-03-10 ENCOUNTER — Other Ambulatory Visit: Payer: Self-pay

## 2013-03-10 MED ORDER — POTASSIUM CHLORIDE ER 10 MEQ PO CPCR: 10.0000 meq | ORAL_CAPSULE | Freq: Two times a day (BID) | ORAL | Status: DC

## 2013-03-10 NOTE — Telephone Encounter (Signed)
I'm calling the patient's insurance to find out what the problem is with the patient's potassium pill and what other potassium pill does the pharmacy recommend she take.

## 2013-03-10 NOTE — Telephone Encounter (Signed)
Refill sent for potassium chloride (micro K)

## 2013-03-10 NOTE — Telephone Encounter (Signed)
Please review not sure what other potassium pill she is needing. Please advise.

## 2013-03-10 NOTE — Telephone Encounter (Signed)
Spoke w/ pt regarding high potassium foods that she can increase in her diet until her K+ supplements are called in, as she reports that she will take her last pill today. Pt reports that she is fond of many of the foods and has most of them in her house, so it will be easy for her to increase these over the next few days.

## 2013-03-10 NOTE — Telephone Encounter (Signed)
Notified patient per her insurance company the new insurance for this year is showing that needs to meet her deductible first and then once she has met her deductible, the price of the potassium will go back to a cheaper price.

## 2013-03-10 NOTE — Telephone Encounter (Signed)
Pt called back, inquiring about her rx. States she is completely out of her medicine. Please call. Ok to leave message on vm.

## 2013-07-15 ENCOUNTER — Telehealth: Payer: Self-pay

## 2013-07-15 NOTE — Telephone Encounter (Signed)
Pt called, received a recall letter, states she is having dizziness, pain between her shoulder blades, "hurting in my chest, sorta pain like", states she has had heartburn also. Please call.

## 2013-07-15 NOTE — Telephone Encounter (Signed)
Spoke w/ pt.  She reports that last night, she experienced "hurting in my chest some", b/t her shoulder blades and had some heartburn. Reports that she felt weak and tired. She took "something for the reflux", her melatonina and her chol pill, went to sleep and feels better this am. Discussed w/ pt that if sx return at night, she can call EMS to come out and do an EKG or she can call our office anytime to speak w/ someone on call.  Pt will keep her appt to see Dr. Rockey Situ on Mon and call w/ any questions or concerns.

## 2013-07-20 ENCOUNTER — Ambulatory Visit (INDEPENDENT_AMBULATORY_CARE_PROVIDER_SITE_OTHER): Payer: Medicare Other | Admitting: Cardiovascular Disease

## 2013-07-20 ENCOUNTER — Encounter: Payer: Self-pay | Admitting: Cardiovascular Disease

## 2013-07-20 VITALS — BP 140/60 | HR 71 | Ht 62.0 in | Wt 197.5 lb

## 2013-07-20 DIAGNOSIS — R12 Heartburn: Secondary | ICD-10-CM

## 2013-07-20 DIAGNOSIS — M549 Dorsalgia, unspecified: Secondary | ICD-10-CM

## 2013-07-20 DIAGNOSIS — R0602 Shortness of breath: Secondary | ICD-10-CM

## 2013-07-20 DIAGNOSIS — E669 Obesity, unspecified: Secondary | ICD-10-CM

## 2013-07-20 DIAGNOSIS — E785 Hyperlipidemia, unspecified: Secondary | ICD-10-CM

## 2013-07-20 MED ORDER — EZETIMIBE 10 MG PO TABS: 10.0000 mg | ORAL_TABLET | Freq: Every day | ORAL | Status: DC

## 2013-07-20 NOTE — Assessment & Plan Note (Signed)
She takes TUMS as needed. We have suggested if symptoms recur on a regular basis, she could try omeprazole daily

## 2013-07-20 NOTE — Patient Instructions (Addendum)
You are doing well.  Please start zetia one a day for cholesterol  Please call us if you have new issues that need to be addressed before your next appt.  Your physician wants you to follow-up in: 12 months.  You will receive a reminder letter in the mail two months in advance. If you don't receive a letter, please call our office to schedule the follow-up appointment.   

## 2013-07-20 NOTE — Assessment & Plan Note (Signed)
We have encouraged continued exercise, careful diet management in an effort to lose weight. 

## 2013-07-20 NOTE — Progress Notes (Signed)
Patient ID: Bonnie Hancock, female    DOB: 1940/10/03, 73 y.o.   MRN: [id number]  HPI Comments: Bonnie Hancock is a 73 year-old woman, patient of Dr. Hall Busing, With history of shortness of breath with exertion, obesity, presenting for routine followup.  We received a phone call 2 weeks ago reporting heartburn symptoms, pain in her shoulder blades. Symptoms have since resolved. She reports that she is under significant stress. She is taking care of her sister's house and state as her sister is very sick. He feels very stressed out. She does not do any regular exercise  Weight continues to be a problem. She denies any significant lower extremity edema. No smoking history. She is tolerating fenofibrate.  She was unable to tolerate any statins. Prior lab work shows total cholesterol 295, LDL 215, TSH 5  Echocardiogram done in July 2012 shows normal LV systolic function, normal valvular function with no significant disease, mildly elevated right ventricular systolic pressures consistent with mild pulmonary hypertension.   EKG today shows normal sinus rhythm with rate 71 beats per minute with no significant ST or T wave changes  Outpatient Encounter Prescriptions as of 07/20/2013  Medication Sig  . aspirin 81 MG tablet Take 81 mg by mouth daily.  . fenofibrate 160 MG tablet Take 1 tablet (160 mg total) by mouth daily.  . furosemide (LASIX) 20 MG tablet Take 20 mg by mouth 2 (two) times daily.  Marland Kitchen LORazepam (ATIVAN) 1 MG tablet Take 0.5 mg by mouth every 8 (eight) hours as needed.   . Melatonin 5 MG CAPS Take 5 mg by mouth at bedtime.  . potassium chloride (MICRO-K) 10 MEQ CR capsule TAKE TWO CAPSULES BY MOUTH ONCE DAILY    Review of Systems  Constitutional: Negative.   HENT: Negative.   Eyes: Negative.   Respiratory: Positive for shortness of breath.   Cardiovascular: Negative.   Gastrointestinal: Negative.   Endocrine: Negative.   Musculoskeletal: Negative.   Skin: Negative.    Allergic/Immunologic: Negative.   Neurological: Negative.   Hematological: Negative.   Psychiatric/Behavioral: The patient is nervous/anxious.   All other systems reviewed and are negative.   BP 140/60  Pulse 71  Ht 5\' 2"  (1.575 m)  Wt 197 lb 8 oz (89.585 kg)  BMI 36.11 kg/m2  Physical Exam  Nursing note and vitals reviewed. Constitutional: She is oriented to person, place, and time. She appears well-developed and well-nourished.  Obese   HENT:  Head: Normocephalic.  Nose: Nose normal.  Mouth/Throat: Oropharynx is clear and moist.  Eyes: Conjunctivae are normal. Pupils are equal, round, and reactive to light.  Neck: Normal range of motion. Neck supple. No JVD present.  Cardiovascular: Normal rate, regular rhythm, S1 normal, S2 normal, normal heart sounds and intact distal pulses.  Exam reveals no gallop and no friction rub.   No murmur heard. Pulmonary/Chest: Effort normal and breath sounds normal. No respiratory distress. She has no wheezes. She has no rales. She exhibits no tenderness.  Abdominal: Soft. Bowel sounds are normal. She exhibits no distension. There is no tenderness.  Musculoskeletal: Normal range of motion. She exhibits no edema and no tenderness.  Lymphadenopathy:    She has no cervical adenopathy.  Neurological: She is alert and oriented to person, place, and time. Coordination normal.  Skin: Skin is warm and dry. No rash noted. No erythema.  Psychiatric: She has a normal mood and affect. Her behavior is normal. Judgment and thought content normal.    Assessment and Plan

## 2013-07-20 NOTE — Assessment & Plan Note (Signed)
Back pain likely musculoskeletal in nature. Less likely angina. Symptoms have since resolved

## 2013-07-20 NOTE — Assessment & Plan Note (Signed)
She is tolerating fenofibrate. Suggested she start zetia 10 mg daily

## 2013-10-12 ENCOUNTER — Other Ambulatory Visit: Payer: Self-pay | Admitting: Cardiovascular Disease

## 2014-02-10 ENCOUNTER — Other Ambulatory Visit: Payer: Self-pay | Admitting: Cardiovascular Disease

## 2014-03-08 ENCOUNTER — Other Ambulatory Visit: Payer: Self-pay | Admitting: Cardiovascular Disease

## 2014-07-16 ENCOUNTER — Telehealth: Payer: Self-pay | Admitting: *Deleted

## 2014-07-16 ENCOUNTER — Other Ambulatory Visit: Payer: Self-pay

## 2014-07-16 MED ORDER — EZETIMIBE 10 MG PO TABS: 10.0000 mg | ORAL_TABLET | Freq: Every day | ORAL | Status: DC

## 2014-07-16 NOTE — Telephone Encounter (Signed)
Refill sent for zetia.  

## 2014-07-16 NOTE — Telephone Encounter (Signed)
°  1. Which medications need to be refilled? Zetia  2. Which pharmacy is medication to be sent to? [facility] [address]  3. Do they need a 30 day or 90 day supply? 30   4. Would they like a call back once the medication has been sent to the pharmacy? No

## 2014-07-20 ENCOUNTER — Encounter: Payer: Self-pay | Admitting: Cardiovascular Disease

## 2014-07-20 ENCOUNTER — Ambulatory Visit (INDEPENDENT_AMBULATORY_CARE_PROVIDER_SITE_OTHER): Payer: PPO | Admitting: Cardiovascular Disease

## 2014-07-20 VITALS — BP 120/60 | HR 61 | Ht 62.0 in | Wt 185.5 lb

## 2014-07-20 DIAGNOSIS — R002 Palpitations: Secondary | ICD-10-CM | POA: Diagnosis not present

## 2014-07-20 DIAGNOSIS — R0602 Shortness of breath: Secondary | ICD-10-CM | POA: Diagnosis not present

## 2014-07-20 DIAGNOSIS — E669 Obesity, unspecified: Secondary | ICD-10-CM | POA: Diagnosis not present

## 2014-07-20 DIAGNOSIS — E785 Hyperlipidemia, unspecified: Secondary | ICD-10-CM

## 2014-07-20 NOTE — Progress Notes (Signed)
Patient ID: Bonnie Hancock, female    DOB: Jul 29, 1940, 74 y.o.   MRN: [id number]  HPI Comments: Bonnie Hancock is a 74 year-old woman, patient of Dr. Hall Busing, With history of shortness of breath with exertion, obesity, presenting for routine followup of her hyperlipidemia and shortness of breath  In follow-up today, she reports that she feels she is doing very well. She is active, denies any shortness of breath She takes Lasix twice a day. If she does not take this, she has abdominal bloating, fullness in her chest. Denies any lower extremity edema.  Weight has been decreasing slowly. Down 10 pounds from her prior clinic visit She is tolerating Zetia. Statin intolerant No smoking history.  EKG on today's visit shows normal sinus rhythm with rate 61 bpm, no significant ST or T-wave changes Prior lab work shows total cholesterol 295, LDL 215, TSH 5  Echocardiogram done in July 2012 shows normal LV systolic function, normal valvular function with no significant disease, mildly elevated right ventricular systolic pressures consistent with mild pulmonary hypertension.  Allergies  Allergen Reactions  . Statins     Current Outpatient Prescriptions on File Prior to Visit  Medication Sig Dispense Refill  . aspirin 81 MG tablet Take 81 mg by mouth daily.    Marland Kitchen ezetimibe (ZETIA) 10 MG tablet Take 1 tablet (10 mg total) by mouth daily. 30 tablet 6  . furosemide (LASIX) 20 MG tablet Take 20 mg by mouth 2 (two) times daily.    Marland Kitchen LORazepam (ATIVAN) 1 MG tablet Take 0.5 mg by mouth every 8 (eight) hours as needed.     . Melatonin 5 MG CAPS Take 5 mg by mouth at bedtime.    . potassium chloride (MICRO-K) 10 MEQ CR capsule TAKE ONE CAPSULE BY MOUTH TWICE DAILY 60 capsule 3   No current facility-administered medications on file prior to visit.    Past Medical History  Diagnosis Date  . Anxiety   . Dyspnea   . Thyroid disease   . Hyperlipidemia     Past Surgical History  Procedure Laterality Date  .  Cholecystectomy    . Appendectomy      Social History  reports that she has never smoked. She does not have any smokeless tobacco history on file. She reports that she drinks alcohol. She reports that she does not use illicit drugs.  Family History family history includes Emphysema in her mother; Heart attack in her father; Heart disease in her mother.        Review of Systems  Constitutional: Negative.   Respiratory: Negative.   Cardiovascular: Negative.   Gastrointestinal: Negative.   Musculoskeletal: Negative.   Neurological: Negative.   Hematological: Negative.   Psychiatric/Behavioral: Negative.   All other systems reviewed and are negative.   BP 120/60 mmHg  Pulse 61  Ht 5\' 2"  (1.575 m)  Wt 185 lb 8 oz (84.142 kg)  BMI 33.92 kg/m2  Physical Exam  Constitutional: She is oriented to person, place, and time. She appears well-developed and well-nourished.  Obese   HENT:  Head: Normocephalic.  Nose: Nose normal.  Mouth/Throat: Oropharynx is clear and moist.  Eyes: Conjunctivae are normal. Pupils are equal, round, and reactive to light.  Neck: Normal range of motion. Neck supple. No JVD present.  Cardiovascular: Normal rate, regular rhythm, S1 normal, S2 normal, normal heart sounds and intact distal pulses.  Exam reveals no gallop and no friction rub.   No murmur heard. Pulmonary/Chest: Effort normal and breath  sounds normal. No respiratory distress. She has no wheezes. She has no rales. She exhibits no tenderness.  Abdominal: Soft. Bowel sounds are normal. She exhibits no distension. There is no tenderness.  Musculoskeletal: Normal range of motion. She exhibits no edema or tenderness.  Lymphadenopathy:    She has no cervical adenopathy.  Neurological: She is alert and oriented to person, place, and time. Coordination normal.  Skin: Skin is warm and dry. No rash noted. No erythema.  Psychiatric: She has a normal mood and affect. Her behavior is normal. Judgment and  thought content normal.    Assessment and Plan  Nursing note and vitals reviewed.

## 2014-07-20 NOTE — Patient Instructions (Signed)
You are doing well. No medication changes were made.  Ok to skip a lasix here and there if not needed  Please call us if you have new issues that need to be addressed before your next appt.  Your physician wants you to follow-up in: 12 months.  You will receive a reminder letter in the mail two months in advance. If you don't receive a letter, please call our office to schedule the follow-up appointment.

## 2014-07-20 NOTE — Assessment & Plan Note (Signed)
We have complemented her on her weight loss Recommended she start a regular exercise program

## 2014-07-20 NOTE — Assessment & Plan Note (Signed)
She feels her shortness of breath is good on the Lasix. No signs of congestive heart failure No recent lab work available. Followed by primary care

## 2014-07-20 NOTE — Assessment & Plan Note (Signed)
Statin intolerant She is tolerating zetia  no longer on fenofibrate.

## 2015-02-24 ENCOUNTER — Telehealth: Payer: Self-pay

## 2015-02-24 NOTE — Telephone Encounter (Signed)
Prior auth for Zetia 10mg  Generic, sent to [facility].

## 2015-02-25 ENCOUNTER — Telehealth: Payer: Self-pay | Admitting: Cardiovascular Disease

## 2015-02-25 NOTE — Telephone Encounter (Signed)
New Message  Prior Auth  Will need the ICD-10 code. Will also need form filled in so that the prior auth can be reviewed.  Please call

## 2015-02-28 NOTE — Telephone Encounter (Signed)
Approved.  

## 2015-03-29 DIAGNOSIS — H5203 Hypermetropia, bilateral: Secondary | ICD-10-CM | POA: Diagnosis not present

## 2015-03-29 DIAGNOSIS — H2513 Age-related nuclear cataract, bilateral: Secondary | ICD-10-CM | POA: Diagnosis not present

## 2015-03-29 DIAGNOSIS — H43813 Vitreous degeneration, bilateral: Secondary | ICD-10-CM | POA: Diagnosis not present

## 2015-03-29 DIAGNOSIS — H52223 Regular astigmatism, bilateral: Secondary | ICD-10-CM | POA: Diagnosis not present

## 2015-04-06 DIAGNOSIS — E785 Hyperlipidemia, unspecified: Secondary | ICD-10-CM | POA: Diagnosis not present

## 2015-04-06 DIAGNOSIS — E039 Hypothyroidism, unspecified: Secondary | ICD-10-CM | POA: Diagnosis not present

## 2015-04-13 DIAGNOSIS — E039 Hypothyroidism, unspecified: Secondary | ICD-10-CM | POA: Diagnosis not present

## 2015-04-13 DIAGNOSIS — E785 Hyperlipidemia, unspecified: Secondary | ICD-10-CM | POA: Diagnosis not present

## 2015-04-14 ENCOUNTER — Ambulatory Visit
Admission: RE | Admit: 2015-04-14 | Discharge: 2015-04-14 | Disposition: A | Discharge status: Home / Self Care | Payer: PPO | Source: Ambulatory Visit | Attending: Internal Medicine | Admitting: Internal Medicine

## 2015-04-14 ENCOUNTER — Other Ambulatory Visit: Payer: Self-pay | Admitting: Internal Medicine

## 2015-04-14 DIAGNOSIS — M50122 Cervical disc disorder at C5-C6 level with radiculopathy: Secondary | ICD-10-CM | POA: Diagnosis not present

## 2015-04-14 DIAGNOSIS — R05 Cough: Secondary | ICD-10-CM

## 2015-04-14 DIAGNOSIS — M50322 Other cervical disc degeneration at C5-C6 level: Secondary | ICD-10-CM | POA: Diagnosis not present

## 2015-04-14 DIAGNOSIS — M50123 Cervical disc disorder at C6-C7 level with radiculopathy: Secondary | ICD-10-CM | POA: Diagnosis not present

## 2015-04-14 DIAGNOSIS — M542 Cervicalgia: Secondary | ICD-10-CM

## 2015-04-14 DIAGNOSIS — R059 Cough, unspecified: Secondary | ICD-10-CM

## 2015-04-14 DIAGNOSIS — R918 Other nonspecific abnormal finding of lung field: Secondary | ICD-10-CM | POA: Diagnosis not present

## 2015-04-14 DIAGNOSIS — M50121 Cervical disc disorder at C4-C5 level with radiculopathy: Secondary | ICD-10-CM | POA: Insufficient documentation

## 2015-07-21 ENCOUNTER — Encounter: Payer: Self-pay | Admitting: Cardiovascular Disease

## 2015-07-21 ENCOUNTER — Ambulatory Visit (INDEPENDENT_AMBULATORY_CARE_PROVIDER_SITE_OTHER): Payer: PPO | Admitting: Cardiovascular Disease

## 2015-07-21 ENCOUNTER — Encounter (INDEPENDENT_AMBULATORY_CARE_PROVIDER_SITE_OTHER): Payer: Self-pay

## 2015-07-21 VITALS — BP 130/68 | HR 67 | Ht 62.0 in | Wt 190.2 lb

## 2015-07-21 DIAGNOSIS — R531 Weakness: Secondary | ICD-10-CM

## 2015-07-21 DIAGNOSIS — E785 Hyperlipidemia, unspecified: Secondary | ICD-10-CM | POA: Diagnosis not present

## 2015-07-21 DIAGNOSIS — R0602 Shortness of breath: Secondary | ICD-10-CM

## 2015-07-21 NOTE — Patient Instructions (Signed)
You are doing well. No medication changes were made.  Feel free to take lasix every other day if no swelling or shortness of breath  Please call us if you have new issues that need to be addressed before your next appt.  Your physician wants you to follow-up in: 6 months.  You will receive a reminder letter in the mail two months in advance. If you don't receive a letter, please call our office to schedule the follow-up appointment.

## 2015-07-21 NOTE — Progress Notes (Signed)
Patient ID: Bonnie Hancock, female   DOB: [birth year], 75 y.o.   MRN: [id number] Cardiology Office Note  Date:  07/21/2015   ID:  Bonnie Hancock, DOB Aug 27, 1940, MRN [id number]  PCP:  Albina Billet, MD   Chief Complaint  Patient presents with  . other    1 yr f/u no complaints. Meds reviewed verbally.    HPI:  Bonnie Hancock is a 75 year-old woman, patient of Dr. Hall Busing, With history of shortness of breath with exertion, obesity, presenting for routine followup  Of her shortness of breath   over the course of the past year, she reports that she has been doing relatively well  periodic episodes of weakness, usually associated with exertion, has to sit down and recover  on those episodes will feel hot, wonders if her sugar is running low, swoons.    difficulty today with her car, accelerate her was getting stuck  recent recovery from upper respiratory infection, has left her weaker  she's been taking Lasix 20 mg daily with potassium  continues to take zetia  Once a day  lab work from primary care has been requested  weight continues to be a problem, no exercise program   EKG on today's visit shows normal sinus rhythm with rate 67 bpm, no significant ST or T-wave changes   other past medical history No smoking history.  She was unable to tolerate any statins. Prior lab work shows total cholesterol 295, LDL 215, TSH 5  Echocardiogram done in July 2012 shows normal LV systolic function, normal valvular function with no significant disease, mildly elevated right ventricular systolic pressures consistent with mild pulmonary hypertension.   PMH:   has a past medical history of Anxiety; Dyspnea; Thyroid disease; and Hyperlipidemia.  PSH:    Past Surgical History  Procedure Laterality Date  . Cholecystectomy    . Appendectomy      Current Outpatient Prescriptions  Medication Sig Dispense Refill  . aspirin 81 MG tablet Take 81 mg by mouth daily.    Marland Kitchen escitalopram (LEXAPRO) 10 MG tablet  Take 10 mg by mouth every other day.    . etodolac (LODINE) 500 MG tablet Take 500 mg by mouth 2 (two) times daily.    Marland Kitchen ezetimibe (ZETIA) 10 MG tablet Take 1 tablet (10 mg total) by mouth daily. 30 tablet 6  . furosemide (LASIX) 20 MG tablet Take 20 mg by mouth 2 (two) times daily as needed.    Marland Kitchen levothyroxine (SYNTHROID, LEVOTHROID) 50 MCG tablet Take 50 mcg by mouth daily before breakfast.    . Melatonin 5 MG CAPS Take 5 mg by mouth at bedtime.    . potassium chloride (MICRO-K) 10 MEQ CR capsule Take 1 capsule (10 mEq total) by mouth 2 (two) times daily as needed. 60 capsule 3   No current facility-administered medications for this visit.     Allergies:   Statins   Social History:  The patient  reports that she has never smoked. She does not have any smokeless tobacco history on file. She reports that she drinks alcohol. She reports that she does not use illicit drugs.   Family History:   family history includes Emphysema in her mother; Heart attack in her father; Heart disease in her mother.    Review of Systems: Review of Systems  Constitutional: Positive for malaise/fatigue.  Respiratory: Negative.   Cardiovascular: Negative.   Gastrointestinal: Negative.   Musculoskeletal: Negative.   Neurological: Positive for weakness.  Psychiatric/Behavioral: Negative.  All other systems reviewed and are negative.    PHYSICAL EXAM: VS:  BP 130/68 mmHg  Pulse 67  Ht 5\' 2"  (1.575 m)  Wt 190 lb 4 oz (86.297 kg)  BMI 34.79 kg/m2 , BMI Body mass index is 34.79 kg/(m^2). GEN: Well nourished, well developed, in no acute distress, obese HEENT: normal Neck: no JVD, carotid bruits, or masses Cardiac: RRR; no murmurs, rubs, or gallops,no edema  Respiratory:  clear to auscultation bilaterally, normal work of breathing GI: soft, nontender, nondistended, + BS MS: no deformity or atrophy Skin: warm and dry, no rash Neuro:  Strength and sensation are intact Psych: euthymic mood, full  affect    Recent Labs: No results found for requested labs within last 365 days.   results have been requested   Lipid Panel No results found for: CHOL, HDL, LDLCALC, TRIG   Requested from primary care  Wt Readings from Last 3 Encounters:  07/21/15 190 lb 4 oz (86.297 kg)  07/20/14 185 lb 8 oz (84.142 kg)  07/20/13 197 lb 8 oz (89.585 kg)       ASSESSMENT AND PLAN:  Hyperlipidemia - Plan: EKG 12-Lead We have requested lab work from primary care Unable to tolerate a statin in the past, Will continue on zetia Could consider CT coronary calcium scoring in the future for risk stratification  SOB (shortness of breath) - Mild shortness of breath on exertion, weakness at times Likely from deconditioning Discussed starting a regular exercise program  Morbid obesity due to excess calories ([place]) Strongly recommended dietary changes, walking program for weight loss  Weakness Likely from deconditioning, obesity Difficulty getting down from the exam table today without assistance Discussed starting a regular exercise program  Disposition:   F/U  12 months   Total encounter time more than 15 minutes  Greater than 50% was spent in counseling and coordination of care with the patient    Orders Placed This Encounter  Procedures  . EKG 12-Lead     Signed, Esmond Plants, M.D., Ph.D. 07/21/2015  [place], [place]

## 2015-08-09 ENCOUNTER — Other Ambulatory Visit: Payer: Self-pay | Admitting: Cardiovascular Disease

## 2015-09-21 DIAGNOSIS — M5134 Other intervertebral disc degeneration, thoracic region: Secondary | ICD-10-CM | POA: Diagnosis not present

## 2015-09-21 DIAGNOSIS — M25519 Pain in unspecified shoulder: Secondary | ICD-10-CM | POA: Diagnosis not present

## 2015-09-21 DIAGNOSIS — M542 Cervicalgia: Secondary | ICD-10-CM | POA: Diagnosis not present

## 2015-09-28 DIAGNOSIS — M5030 Other cervical disc degeneration, unspecified cervical region: Secondary | ICD-10-CM | POA: Diagnosis not present

## 2015-09-28 DIAGNOSIS — M5412 Radiculopathy, cervical region: Secondary | ICD-10-CM | POA: Diagnosis not present

## 2015-10-07 DIAGNOSIS — M5030 Other cervical disc degeneration, unspecified cervical region: Secondary | ICD-10-CM | POA: Diagnosis not present

## 2015-10-10 DIAGNOSIS — M5030 Other cervical disc degeneration, unspecified cervical region: Secondary | ICD-10-CM | POA: Diagnosis not present

## 2015-10-17 DIAGNOSIS — M5030 Other cervical disc degeneration, unspecified cervical region: Secondary | ICD-10-CM | POA: Diagnosis not present

## 2015-10-19 DIAGNOSIS — M5412 Radiculopathy, cervical region: Secondary | ICD-10-CM | POA: Diagnosis not present

## 2015-10-19 DIAGNOSIS — M5030 Other cervical disc degeneration, unspecified cervical region: Secondary | ICD-10-CM | POA: Diagnosis not present

## 2015-10-20 ENCOUNTER — Other Ambulatory Visit: Payer: Self-pay | Admitting: Orthopedic Surgery

## 2015-10-20 DIAGNOSIS — M5030 Other cervical disc degeneration, unspecified cervical region: Secondary | ICD-10-CM

## 2015-10-20 DIAGNOSIS — M5412 Radiculopathy, cervical region: Secondary | ICD-10-CM

## 2015-11-02 ENCOUNTER — Ambulatory Visit
Admission: RE | Admit: 2015-11-02 | Discharge: 2015-11-02 | Disposition: A | Discharge status: Home / Self Care | Payer: PPO | Source: Ambulatory Visit | Attending: Orthopedic Surgery | Admitting: Orthopedic Surgery

## 2015-11-02 DIAGNOSIS — M5412 Radiculopathy, cervical region: Secondary | ICD-10-CM | POA: Diagnosis not present

## 2015-11-02 DIAGNOSIS — M50321 Other cervical disc degeneration at C4-C5 level: Secondary | ICD-10-CM | POA: Diagnosis not present

## 2015-11-02 DIAGNOSIS — M4722 Other spondylosis with radiculopathy, cervical region: Secondary | ICD-10-CM | POA: Diagnosis not present

## 2015-11-02 DIAGNOSIS — M5134 Other intervertebral disc degeneration, thoracic region: Secondary | ICD-10-CM | POA: Insufficient documentation

## 2015-11-02 DIAGNOSIS — M5030 Other cervical disc degeneration, unspecified cervical region: Secondary | ICD-10-CM | POA: Diagnosis not present

## 2015-11-02 DIAGNOSIS — M50323 Other cervical disc degeneration at C6-C7 level: Secondary | ICD-10-CM | POA: Diagnosis not present

## 2015-12-08 DIAGNOSIS — M5412 Radiculopathy, cervical region: Secondary | ICD-10-CM | POA: Diagnosis not present

## 2015-12-08 DIAGNOSIS — M5030 Other cervical disc degeneration, unspecified cervical region: Secondary | ICD-10-CM | POA: Diagnosis not present

## 2015-12-08 DIAGNOSIS — M4802 Spinal stenosis, cervical region: Secondary | ICD-10-CM | POA: Diagnosis not present

## 2015-12-10 ENCOUNTER — Other Ambulatory Visit: Payer: Self-pay | Admitting: Cardiovascular Disease

## 2016-01-10 DIAGNOSIS — R05 Cough: Secondary | ICD-10-CM | POA: Diagnosis not present

## 2016-01-10 DIAGNOSIS — J019 Acute sinusitis, unspecified: Secondary | ICD-10-CM | POA: Diagnosis not present

## 2016-01-10 DIAGNOSIS — B9689 Other specified bacterial agents as the cause of diseases classified elsewhere: Secondary | ICD-10-CM | POA: Diagnosis not present

## 2016-03-06 DIAGNOSIS — M5412 Radiculopathy, cervical region: Secondary | ICD-10-CM | POA: Diagnosis not present

## 2016-03-06 DIAGNOSIS — M4802 Spinal stenosis, cervical region: Secondary | ICD-10-CM | POA: Diagnosis not present

## 2016-03-06 DIAGNOSIS — M5030 Other cervical disc degeneration, unspecified cervical region: Secondary | ICD-10-CM | POA: Diagnosis not present

## 2016-04-03 DIAGNOSIS — M5412 Radiculopathy, cervical region: Secondary | ICD-10-CM | POA: Diagnosis not present

## 2016-04-03 DIAGNOSIS — M5030 Other cervical disc degeneration, unspecified cervical region: Secondary | ICD-10-CM | POA: Diagnosis not present

## 2016-04-13 DIAGNOSIS — J029 Acute pharyngitis, unspecified: Secondary | ICD-10-CM | POA: Diagnosis not present

## 2016-04-13 DIAGNOSIS — R079 Chest pain, unspecified: Secondary | ICD-10-CM | POA: Diagnosis not present

## 2016-04-14 ENCOUNTER — Other Ambulatory Visit: Payer: Self-pay | Admitting: Cardiovascular Disease

## 2016-06-29 DIAGNOSIS — M4802 Spinal stenosis, cervical region: Secondary | ICD-10-CM | POA: Diagnosis not present

## 2016-06-29 DIAGNOSIS — M5412 Radiculopathy, cervical region: Secondary | ICD-10-CM | POA: Diagnosis not present

## 2016-06-29 DIAGNOSIS — M5030 Other cervical disc degeneration, unspecified cervical region: Secondary | ICD-10-CM | POA: Diagnosis not present

## 2016-07-08 DIAGNOSIS — J069 Acute upper respiratory infection, unspecified: Secondary | ICD-10-CM | POA: Diagnosis not present

## 2016-07-08 DIAGNOSIS — J019 Acute sinusitis, unspecified: Secondary | ICD-10-CM | POA: Diagnosis not present

## 2016-07-22 NOTE — Progress Notes (Signed)
Patient ID: Bonnie Hancock, female   DOB: April 28, 1940, 76 y.o.   MRN: [id number] Cardiology Office Note  Date:  07/23/2016   ID:  Bonnie Hancock, DOB 1940/07/16, MRN [id number]  PCP:  Albina Billet, MD   Chief Complaint  Patient presents with  . other    6 month follow up. Meds reviewed by the pt. verbally. "doing well."     HPI:   Bonnie Hancock is a 76 year-old woman,  With history of  shortness of breath with exertion,  Chronic diastolic CHF obesity,  presenting for routine followup  Of her shortness of breath  Neck pain,  Having shoulder pain Doing cortisone shots  "gaining fluid" Has been taking Lasix daily Some swelling in her legs and fingers this summer   continues to take zetia  Once a day  Has not seen Dr. Hall Busing in a few years We will call him to schedule an appointment  No regular exercise program Denies any palpitations, chest pain, shortness of breath on exertion   EKG personally reviewed by myself on todays visit Shows normal sinus rhythm with rate 67 bpm no significant ST or T-wave changes   Other past medical history reviewed  periodic episodes of weakness, usually associated with exertion, has to sit down and recover on those episodes will feel hot, wonders if her sugar is running low, swoons.   No smoking history.  She was unable to tolerate any statins. Prior lab work shows total cholesterol 295, LDL 215, TSH 5  Echocardiogram done in July 2012 shows normal LV systolic function, normal valvular function with no significant disease, mildly elevated right ventricular systolic pressures consistent with mild pulmonary hypertension.   PMH:   has a past medical history of Anxiety; Dyspnea; Hyperlipidemia; and Thyroid disease.  PSH:    Past Surgical History:  Procedure Laterality Date  . APPENDECTOMY    . CHOLECYSTECTOMY      Current Outpatient Prescriptions  Medication Sig Dispense Refill  . aspirin 81 MG tablet Take 81 mg by mouth daily.    Marland Kitchen  escitalopram (LEXAPRO) 10 MG tablet Take 10 mg by mouth every other day.    . etodolac (LODINE) 500 MG tablet Take 500 mg by mouth 2 (two) times daily.    Marland Kitchen ezetimibe (ZETIA) 10 MG tablet TAKE ONE TABLET BY MOUTH ONCE DAILY 90 tablet 3  . furosemide (LASIX) 20 MG tablet Take 20 mg by mouth 2 (two) times daily as needed.    Marland Kitchen levothyroxine (SYNTHROID, LEVOTHROID) 50 MCG tablet Take 50 mcg by mouth daily before breakfast.    . Melatonin 5 MG CAPS Take 5 mg by mouth at bedtime.    . potassium chloride (MICRO-K) 10 MEQ CR capsule Take 1 capsule (10 mEq total) by mouth 2 (two) times daily as needed. 60 capsule 3   No current facility-administered medications for this visit.      Allergies:   Statins   Social History:  The patient  reports that she has never smoked. She has never used smokeless tobacco. She reports that she drinks alcohol. She reports that she does not use drugs.   Family History:   family history includes Emphysema in her mother; Heart attack in her father; Heart disease in her mother.    Review of Systems: Review of Systems  Constitutional: Negative.   Respiratory: Negative.   Cardiovascular: Negative.   Gastrointestinal: Negative.   Musculoskeletal: Positive for joint pain and neck pain.  Psychiatric/Behavioral: Negative.  All other systems reviewed and are negative.    PHYSICAL EXAM: VS:  BP 140/60 (BP Location: Left Arm, Patient Position: Sitting, Cuff Size: Normal)   Pulse 67   Ht 5\' 2"  (1.575 m)   Wt 193 lb 12 oz (87.9 kg)   BMI 35.44 kg/m  , BMI Body mass index is 35.44 kg/m.  GEN: Well nourished, well developed, in no acute distress , obese HEENT: normal  Neck: no JVD, carotid bruits, or masses Cardiac: RRR; no murmurs, rubs, or gallops,no edema  Respiratory:  clear to auscultation bilaterally, normal work of breathing GI: soft, nontender, nondistended, + BS MS: no deformity or atrophy  Skin: warm and dry, no rash Neuro:  Strength and sensation are  intact Psych: euthymic mood, full affect    Recent Labs: No results found for requested labs within last 8760 hours.   results have been requested   Lipid Panel No results found for: CHOL, HDL, LDLCALC, TRIG   Requested from primary care  Wt Readings from Last 3 Encounters:  07/23/16 193 lb 12 oz (87.9 kg)  07/21/15 190 lb 4 oz (86.3 kg)  07/20/14 185 lb 8 oz (84.1 kg)       ASSESSMENT AND PLAN:   Hyperlipidemia - Plan: EKG 12-Lead She will follow-up with primary care for lab work Unable to tolerate a statin in the past,  continue on zetia  SOB (shortness of breath) - In the past with Mild shortness of breath on exertion, weakness at times Likely from deconditioning  Chronic diastolic CHF On Lasix daily, likely exacerbated by weight which continues to trend upwards over the past several years. Lab work pending through primary care. Patient call to make appointment  Morbid obesity due to excess calories (Chloride)  recommended dietary changes, walking program for weight loss  Weakness Deconditioned, weight, recommended exercise program  Arthritis Neck, shoulders Receiving cortisone  Disposition:   F/U  12 months   Total encounter time more than 25 minutes  Greater than 50% was spent in counseling and coordination of care with the patient    Orders Placed This Encounter  Procedures  . EKG 12-Lead     Signed, Esmond Plants, M.D., Ph.D. 07/23/2016  [place], [place]

## 2016-07-23 ENCOUNTER — Encounter: Payer: Self-pay | Admitting: Cardiovascular Disease

## 2016-07-23 ENCOUNTER — Ambulatory Visit (INDEPENDENT_AMBULATORY_CARE_PROVIDER_SITE_OTHER): Payer: PPO | Admitting: Cardiovascular Disease

## 2016-07-23 VITALS — BP 140/60 | HR 67 | Ht 62.0 in | Wt 193.8 lb

## 2016-07-23 DIAGNOSIS — M1990 Unspecified osteoarthritis, unspecified site: Secondary | ICD-10-CM

## 2016-07-23 DIAGNOSIS — I5032 Chronic diastolic (congestive) heart failure: Secondary | ICD-10-CM

## 2016-07-23 DIAGNOSIS — R0602 Shortness of breath: Secondary | ICD-10-CM | POA: Diagnosis not present

## 2016-07-23 DIAGNOSIS — E782 Mixed hyperlipidemia: Secondary | ICD-10-CM | POA: Diagnosis not present

## 2016-07-23 NOTE — Patient Instructions (Signed)

## 2016-08-01 DIAGNOSIS — E785 Hyperlipidemia, unspecified: Secondary | ICD-10-CM | POA: Diagnosis not present

## 2016-08-01 DIAGNOSIS — E039 Hypothyroidism, unspecified: Secondary | ICD-10-CM | POA: Diagnosis not present

## 2016-08-08 DIAGNOSIS — E785 Hyperlipidemia, unspecified: Secondary | ICD-10-CM | POA: Diagnosis not present

## 2016-08-08 DIAGNOSIS — E039 Hypothyroidism, unspecified: Secondary | ICD-10-CM | POA: Diagnosis not present

## 2016-08-08 DIAGNOSIS — M5134 Other intervertebral disc degeneration, thoracic region: Secondary | ICD-10-CM | POA: Diagnosis not present

## 2016-08-09 DIAGNOSIS — M5030 Other cervical disc degeneration, unspecified cervical region: Secondary | ICD-10-CM | POA: Diagnosis not present

## 2016-08-09 DIAGNOSIS — M4802 Spinal stenosis, cervical region: Secondary | ICD-10-CM | POA: Diagnosis not present

## 2016-08-09 DIAGNOSIS — M5412 Radiculopathy, cervical region: Secondary | ICD-10-CM | POA: Diagnosis not present

## 2016-08-29 DIAGNOSIS — H43813 Vitreous degeneration, bilateral: Secondary | ICD-10-CM | POA: Diagnosis not present

## 2016-08-29 DIAGNOSIS — H2513 Age-related nuclear cataract, bilateral: Secondary | ICD-10-CM | POA: Diagnosis not present

## 2016-08-29 DIAGNOSIS — H5203 Hypermetropia, bilateral: Secondary | ICD-10-CM | POA: Diagnosis not present

## 2016-08-29 DIAGNOSIS — H52223 Regular astigmatism, bilateral: Secondary | ICD-10-CM | POA: Diagnosis not present

## 2017-01-30 DIAGNOSIS — E039 Hypothyroidism, unspecified: Secondary | ICD-10-CM | POA: Diagnosis not present

## 2017-01-30 DIAGNOSIS — E785 Hyperlipidemia, unspecified: Secondary | ICD-10-CM | POA: Diagnosis not present

## 2017-02-06 DIAGNOSIS — E785 Hyperlipidemia, unspecified: Secondary | ICD-10-CM | POA: Diagnosis not present

## 2017-02-06 DIAGNOSIS — M5134 Other intervertebral disc degeneration, thoracic region: Secondary | ICD-10-CM | POA: Diagnosis not present

## 2017-02-06 DIAGNOSIS — E038 Other specified hypothyroidism: Secondary | ICD-10-CM | POA: Diagnosis not present

## 2017-03-27 DIAGNOSIS — H52223 Regular astigmatism, bilateral: Secondary | ICD-10-CM | POA: Diagnosis not present

## 2017-03-27 DIAGNOSIS — H43813 Vitreous degeneration, bilateral: Secondary | ICD-10-CM | POA: Diagnosis not present

## 2017-03-27 DIAGNOSIS — H5203 Hypermetropia, bilateral: Secondary | ICD-10-CM | POA: Diagnosis not present

## 2017-03-27 DIAGNOSIS — H2513 Age-related nuclear cataract, bilateral: Secondary | ICD-10-CM | POA: Diagnosis not present

## 2017-04-15 ENCOUNTER — Other Ambulatory Visit: Payer: Self-pay | Admitting: Cardiovascular Disease

## 2017-07-18 DIAGNOSIS — H2513 Age-related nuclear cataract, bilateral: Secondary | ICD-10-CM | POA: Diagnosis not present

## 2017-08-09 DIAGNOSIS — E785 Hyperlipidemia, unspecified: Secondary | ICD-10-CM | POA: Diagnosis not present

## 2017-08-09 DIAGNOSIS — E039 Hypothyroidism, unspecified: Secondary | ICD-10-CM | POA: Diagnosis not present

## 2017-08-14 DIAGNOSIS — E038 Other specified hypothyroidism: Secondary | ICD-10-CM | POA: Diagnosis not present

## 2017-08-14 DIAGNOSIS — E785 Hyperlipidemia, unspecified: Secondary | ICD-10-CM | POA: Diagnosis not present

## 2017-08-14 DIAGNOSIS — M5134 Other intervertebral disc degeneration, thoracic region: Secondary | ICD-10-CM | POA: Diagnosis not present

## 2017-09-03 DIAGNOSIS — H2512 Age-related nuclear cataract, left eye: Secondary | ICD-10-CM | POA: Diagnosis not present

## 2017-09-10 ENCOUNTER — Encounter: Payer: Self-pay | Admitting: *Deleted

## 2017-09-17 ENCOUNTER — Ambulatory Visit: Payer: PPO | Admitting: Anesthesiology

## 2017-09-17 ENCOUNTER — Encounter
Admission: RE | Disposition: A | Discharge status: Home / Self Care | Payer: Self-pay | Source: Ambulatory Visit | Attending: Ophthalmology

## 2017-09-17 ENCOUNTER — Ambulatory Visit
Admission: RE | Admit: 2017-09-17 | Discharge: 2017-09-17 | Disposition: A | Discharge status: Home / Self Care | Payer: PPO | Source: Ambulatory Visit | Attending: Ophthalmology | Admitting: Ophthalmology

## 2017-09-17 ENCOUNTER — Other Ambulatory Visit: Payer: Self-pay

## 2017-09-17 ENCOUNTER — Encounter: Payer: Self-pay | Admitting: *Deleted

## 2017-09-17 DIAGNOSIS — F419 Anxiety disorder, unspecified: Secondary | ICD-10-CM | POA: Diagnosis not present

## 2017-09-17 DIAGNOSIS — F329 Major depressive disorder, single episode, unspecified: Secondary | ICD-10-CM | POA: Diagnosis not present

## 2017-09-17 DIAGNOSIS — Z79899 Other long term (current) drug therapy: Secondary | ICD-10-CM | POA: Diagnosis not present

## 2017-09-17 DIAGNOSIS — M47812 Spondylosis without myelopathy or radiculopathy, cervical region: Secondary | ICD-10-CM | POA: Diagnosis not present

## 2017-09-17 DIAGNOSIS — F418 Other specified anxiety disorders: Secondary | ICD-10-CM | POA: Diagnosis not present

## 2017-09-17 DIAGNOSIS — H2512 Age-related nuclear cataract, left eye: Secondary | ICD-10-CM | POA: Insufficient documentation

## 2017-09-17 DIAGNOSIS — E039 Hypothyroidism, unspecified: Secondary | ICD-10-CM | POA: Diagnosis not present

## 2017-09-17 DIAGNOSIS — Z888 Allergy status to other drugs, medicaments and biological substances status: Secondary | ICD-10-CM | POA: Insufficient documentation

## 2017-09-17 DIAGNOSIS — E7800 Pure hypercholesterolemia, unspecified: Secondary | ICD-10-CM | POA: Diagnosis not present

## 2017-09-17 DIAGNOSIS — E782 Mixed hyperlipidemia: Secondary | ICD-10-CM | POA: Diagnosis not present

## 2017-09-17 DIAGNOSIS — Z8542 Personal history of malignant neoplasm of other parts of uterus: Secondary | ICD-10-CM | POA: Diagnosis not present

## 2017-09-17 DIAGNOSIS — Z7982 Long term (current) use of aspirin: Secondary | ICD-10-CM | POA: Insufficient documentation

## 2017-09-17 HISTORY — PX: CATARACT EXTRACTION W/PHACO: SHX586

## 2017-09-17 HISTORY — DX: Hypothyroidism, unspecified: E03.9

## 2017-09-17 HISTORY — DX: Cardiac murmur, unspecified: R01.1

## 2017-09-17 HISTORY — DX: Major depressive disorder, single episode, unspecified: F32.9

## 2017-09-17 HISTORY — DX: Unspecified osteoarthritis, unspecified site: M19.90

## 2017-09-17 HISTORY — DX: Unspecified hearing loss, unspecified ear: H91.90

## 2017-09-17 HISTORY — DX: Depression, unspecified: F32.A

## 2017-09-17 HISTORY — DX: Malignant (primary) neoplasm, unspecified: C80.1

## 2017-09-17 SURGERY — PHACOEMULSIFICATION, CATARACT, WITH IOL INSERTION
Anesthesia: Monitor Anesthesia Care | Site: Eye | Laterality: Left | Wound class: "Clean "

## 2017-09-17 MED ORDER — BSS IO SOLN
INTRAOCULAR | Status: DC | PRN
  Administered 2017-09-17: 2 mL via OPHTHALMIC

## 2017-09-17 MED ORDER — TETRACAINE HCL 0.5 % OP SOLN
OPHTHALMIC | Status: AC
  Filled 2017-09-17: qty 4

## 2017-09-17 MED ORDER — MIDAZOLAM HCL 2 MG/2ML IJ SOLN
INTRAMUSCULAR | Status: DC | PRN
  Administered 2017-09-17: 0.5 mg via INTRAVENOUS

## 2017-09-17 MED ORDER — SODIUM CHLORIDE 0.9 % IV SOLN
INTRAVENOUS | Status: DC
  Administered 2017-09-17: 10:00:00 via INTRAVENOUS

## 2017-09-17 MED ORDER — MOXIFLOXACIN HCL 0.5 % OP SOLN
OPHTHALMIC | Status: AC
  Filled 2017-09-17: qty 3

## 2017-09-17 MED ORDER — MOXIFLOXACIN HCL 0.5 % OP SOLN: 1.0000 [drp] | OPHTHALMIC | Status: DC | PRN

## 2017-09-17 MED ORDER — ONDANSETRON HCL 4 MG/2ML IJ SOLN: 4.0000 mg | Freq: Once | INTRAMUSCULAR | Status: DC | PRN

## 2017-09-17 MED ORDER — PHENYLEPHRINE HCL 10 % OP SOLN
OPHTHALMIC | Status: AC
  Filled 2017-09-17: qty 5

## 2017-09-17 MED ORDER — NA CHONDROIT SULF-NA HYALURON 40-17 MG/ML IO SOLN
INTRAOCULAR | Status: AC
  Filled 2017-09-17: qty 1

## 2017-09-17 MED ORDER — POVIDONE-IODINE 5 % OP SOLN
OPHTHALMIC | Status: AC
  Filled 2017-09-17: qty 30

## 2017-09-17 MED ORDER — EPINEPHRINE PF 1 MG/ML IJ SOLN
INTRAMUSCULAR | Status: AC
  Filled 2017-09-17: qty 1

## 2017-09-17 MED ORDER — CYCLOPENTOLATE HCL 2 % OP SOLN
1.0000 [drp] | OPHTHALMIC | Status: AC
  Administered 2017-09-17: 1 [drp] via OPHTHALMIC
  Administered 2017-09-17: 10:00:00 via OPHTHALMIC
  Administered 2017-09-17 (×2): 1 [drp] via OPHTHALMIC

## 2017-09-17 MED ORDER — LIDOCAINE HCL (PF) 4 % IJ SOLN
INTRAMUSCULAR | Status: AC
  Filled 2017-09-17: qty 5

## 2017-09-17 MED ORDER — POVIDONE-IODINE 5 % OP SOLN
OPHTHALMIC | Status: DC | PRN
  Administered 2017-09-17: 1 via OPHTHALMIC

## 2017-09-17 MED ORDER — NA CHONDROIT SULF-NA HYALURON 40-17 MG/ML IO SOLN
INTRAOCULAR | Status: DC | PRN
  Administered 2017-09-17: 1 mL via INTRAOCULAR

## 2017-09-17 MED ORDER — EPINEPHRINE PF 1 MG/ML IJ SOLN
INTRAOCULAR | Status: DC | PRN
  Administered 2017-09-17: 1 mL via OPHTHALMIC

## 2017-09-17 MED ORDER — TETRACAINE HCL 0.5 % OP SOLN
1.0000 [drp] | Freq: Once | OPHTHALMIC | Status: AC
  Administered 2017-09-17: 09:00:00 via OPHTHALMIC

## 2017-09-17 MED ORDER — CARBACHOL 0.01 % IO SOLN
INTRAOCULAR | Status: DC | PRN
  Administered 2017-09-17: .5 mL via INTRAOCULAR

## 2017-09-17 MED ORDER — CYCLOPENTOLATE HCL 2 % OP SOLN
OPHTHALMIC | Status: AC
  Filled 2017-09-17: qty 2

## 2017-09-17 MED ORDER — MOXIFLOXACIN HCL 0.5 % OP SOLN
OPHTHALMIC | Status: DC | PRN
  Administered 2017-09-17: .2 mL via OPHTHALMIC

## 2017-09-17 MED ORDER — PHENYLEPHRINE HCL 10 % OP SOLN
1.0000 [drp] | OPHTHALMIC | Status: AC
  Administered 2017-09-17 (×2): 1 [drp] via OPHTHALMIC
  Administered 2017-09-17: 10:00:00 via OPHTHALMIC
  Administered 2017-09-17: 1 [drp] via OPHTHALMIC

## 2017-09-17 MED ORDER — FENTANYL CITRATE (PF) 100 MCG/2ML IJ SOLN
INTRAMUSCULAR | Status: DC | PRN
  Administered 2017-09-17: 25 ug via INTRAVENOUS

## 2017-09-17 SURGICAL SUPPLY — 16 items
GLOVE BIO SURGEON STRL SZ8 (GLOVE) ×2 IMPLANT
GLOVE BIOGEL M 6.5 STRL (GLOVE) ×2 IMPLANT
GLOVE SURG LX 8.0 MICRO (GLOVE) ×1
GLOVE SURG LX STRL 8.0 MICRO (GLOVE) ×1 IMPLANT
GOWN STRL REUS W/ TWL LRG LVL3 (GOWN DISPOSABLE) ×2 IMPLANT
GOWN STRL REUS W/TWL LRG LVL3 (GOWN DISPOSABLE) ×2
LABEL CATARACT MEDS ST (LABEL) ×2 IMPLANT
LENS IOL TECNIS ITEC 23.5 (Intraocular Lens) ×1 IMPLANT
PACK CATARACT (MISCELLANEOUS) ×2 IMPLANT
PACK CATARACT BRASINGTON LX (MISCELLANEOUS) ×2 IMPLANT
PACK EYE AFTER SURG (MISCELLANEOUS) ×2 IMPLANT
SOL BSS BAG (MISCELLANEOUS) ×2
SOLUTION BSS BAG (MISCELLANEOUS) ×1 IMPLANT
SYR 5ML LL (SYRINGE) ×2 IMPLANT
WATER STERILE IRR 250ML POUR (IV SOLUTION) ×2 IMPLANT
WIPE NON LINTING 3.25X3.25 (MISCELLANEOUS) ×2 IMPLANT

## 2017-09-17 NOTE — Transfer of Care (Signed)
Immediate Anesthesia Transfer of Care Note  Patient: Bonnie Hancock  Procedure(s) Performed: CATARACT EXTRACTION PHACO AND INTRAOCULAR LENS PLACEMENT (I[facility]) (Left Eye)  Patient Location: PACU  Anesthesia Type:MAC  Level of Consciousness: awake, alert , oriented and patient cooperative  Airway & Oxygen Therapy: Patient Spontanous Breathing  Post-op Assessment: Report given to RN and Post -op Vital signs reviewed and stable  Post vital signs: Reviewed and stable  Last Vitals:  Vitals Value Taken Time  BP    Temp    Pulse    Resp    SpO2      Last Pain:  Vitals:   09/17/17 0910  TempSrc: Oral  PainSc: 0-No pain         Complications: No apparent anesthesia complications

## 2017-09-17 NOTE — H&P (Signed)
All labs reviewed. Abnormal studies sent to patients PCP when indicated.  Previous H&P reviewed, patient examined, there are NO CHANGES.  Bonnie Akre Porfilio7/30/201910:02 AM

## 2017-09-17 NOTE — Anesthesia Postprocedure Evaluation (Signed)
Anesthesia Post Note  Patient: Bonnie Hancock  Procedure(s) Performed: CATARACT EXTRACTION PHACO AND INTRAOCULAR LENS PLACEMENT ([place]) (Left Eye)  Patient location during evaluation: PACU Anesthesia Type: MAC Level of consciousness: awake and alert Pain management: pain level controlled Vital Signs Assessment: post-procedure vital signs reviewed and stable Anesthetic complications: no     Last Vitals:  Vitals:   09/17/17 0910 09/17/17 1029  BP: (!) 138/53   Pulse: 62 63  Resp: 18 18  Temp: (!) 36.1 C 36.6 C  SpO2: 98%     Last Pain:  Vitals:   09/17/17 1029  TempSrc: Temporal  PainSc: 0-No pain                 Bernardo Heater

## 2017-09-17 NOTE — Anesthesia Preprocedure Evaluation (Signed)
Anesthesia Evaluation  Patient identified by MRN, date of birth, ID band Patient awake    Reviewed: Allergy & Precautions, NPO status , Patient's Chart, lab work & pertinent test results  History of Anesthesia Complications Negative for: history of anesthetic complications  Airway Mallampati: III       Dental  (+) Partial Lower   Pulmonary neg sleep apnea,           Cardiovascular (-) hypertension(-) Past MI and (-) CHF (-) dysrhythmias + Valvular Problems/Murmurs      Neuro/Psych neg Seizures Anxiety Depression    GI/Hepatic Neg liver ROS, neg GERD  ,  Endo/Other  neg diabetesHypothyroidism   Renal/GU negative Renal ROS     Musculoskeletal   Abdominal   Peds  Hematology   Anesthesia Other Findings   Reproductive/Obstetrics                             Anesthesia Physical Anesthesia Plan  ASA: III  Anesthesia Plan: MAC   Post-op Pain Management:    Induction: Intravenous  PONV Risk Score and Plan:   Airway Management Planned:   Additional Equipment:   Intra-op Plan:   Post-operative Plan:   Informed Consent: I have reviewed the patients History and Physical, chart, labs and discussed the procedure including the risks, benefits and alternatives for the proposed anesthesia with the patient or authorized representative who has indicated his/her understanding and acceptance.     Plan Discussed with:   Anesthesia Plan Comments:         Anesthesia Quick Evaluation

## 2017-09-17 NOTE — Anesthesia Post-op Follow-up Note (Signed)
Anesthesia QCDR form completed.        

## 2017-09-17 NOTE — Op Note (Signed)
PREOPERATIVE DIAGNOSIS:  Nuclear sclerotic cataract of the left eye.   POSTOPERATIVE DIAGNOSIS:  Nuclear sclerotic cataract of the left eye.   OPERATIVE PROCEDURE: Procedure(s): CATARACT EXTRACTION PHACO AND INTRAOCULAR LENS PLACEMENT (IOC)   SURGEON:  Birder Robson, MD.   ANESTHESIA:  Anesthesiologist: Gunnar Fusi, MD CRNA: Bernardo Heater, CRNA  1.      Managed anesthesia care. 2.     0.40ml of Shugarcaine was instilled following the paracentesis   COMPLICATIONS:  None.   TECHNIQUE:   Stop and chop   DESCRIPTION OF PROCEDURE:  The patient was examined and consented in the preoperative holding area where the aforementioned topical anesthesia was applied to the left eye and then brought back to the Operating Room where the left eye was prepped and draped in the usual sterile ophthalmic fashion and a lid speculum was placed. A paracentesis was created with the side port blade and the anterior chamber was filled with viscoelastic. A near clear corneal incision was performed with the steel keratome. A continuous curvilinear capsulorrhexis was performed with a cystotome followed by the capsulorrhexis forceps. Hydrodissection and hydrodelineation were carried out with BSS on a blunt cannula. The lens was removed in a stop and chop  technique and the remaining cortical material was removed with the irrigation-aspiration handpiece. The capsular bag was inflated with viscoelastic and the Technis ZCB00 lens was placed in the capsular bag without complication. The remaining viscoelastic was removed from the eye with the irrigation-aspiration handpiece. The wounds were hydrated. The anterior chamber was flushed with Miostat and the eye was inflated to physiologic pressure. 0.6ml Vigamox was placed in the anterior chamber. The wounds were found to be water tight. The eye was dressed with Vigamox. The patient was given protective glasses to wear throughout the day and a shield with which to sleep  tonight. The patient was also given drops with which to begin a drop regimen today and will follow-up with me in one day. Implant Name Type Inv. Item Serial No. Manufacturer Lot No. LRB No. Used  LENS IOL DIOP 23.5 - [id number] Intraocular Lens LENS IOL DIOP 23.5 [id number]  Left 1    Procedure(s) with comments: CATARACT EXTRACTION PHACO AND INTRAOCULAR LENS PLACEMENT (IOC) (Left) - [place] 00:48 AP% 13.0 CDE 6.26 Fluid pack lot # [id number]  Electronically signed: Birder Robson 09/17/2017 10:29 AM

## 2017-09-17 NOTE — Discharge Instructions (Addendum)
Eye Surgery Discharge Instructions  Expect mild scratchy sensation or mild soreness. DO NOT RUB YOUR EYE!  The day of surgery:  Minimal physical activity, but bed rest is not required  No reading, computer work, or close hand work  No bending, lifting, or straining.  May watch TV  For 24 hours:  No driving, legal decisions, or alcoholic beverages  Safety precautions  Eat anything you prefer: It is better to start with liquids, then soup then solid foods.  Solar shield eyeglasses should be worn for comfort in the sunlight/patch while sleeping  Resume all regular medications including aspirin or Coumadin if these were discontinued prior to surgery. You may shower, bathe, shave, or wash your hair. Tylenol may be taken for mild discomfort. FOLLOW EYE DROP INSTRUCTION SHEET AS REVIEWED.  Call your doctor if you experience significant pain, nausea, or vomiting, fever > 101 or other signs of infection. [phone] or [phone] Specific instructions:  Follow-up Information    Birder Robson, MD Follow up.   Specialty:  Ophthalmology Why:  09/18/17 @ 9:00 am Contact information: [place] [address] [phone]

## 2017-09-24 DIAGNOSIS — M4802 Spinal stenosis, cervical region: Secondary | ICD-10-CM | POA: Diagnosis not present

## 2017-09-24 DIAGNOSIS — M5412 Radiculopathy, cervical region: Secondary | ICD-10-CM | POA: Diagnosis not present

## 2017-09-24 DIAGNOSIS — M5030 Other cervical disc degeneration, unspecified cervical region: Secondary | ICD-10-CM | POA: Diagnosis not present

## 2017-10-01 DIAGNOSIS — H2511 Age-related nuclear cataract, right eye: Secondary | ICD-10-CM | POA: Diagnosis not present

## 2017-10-03 DIAGNOSIS — F419 Anxiety disorder, unspecified: Secondary | ICD-10-CM | POA: Diagnosis not present

## 2017-10-15 ENCOUNTER — Encounter: Payer: Self-pay | Admitting: *Deleted

## 2017-10-15 ENCOUNTER — Ambulatory Visit
Admission: RE | Admit: 2017-10-15 | Discharge: 2017-10-15 | Disposition: A | Discharge status: Home / Self Care | Payer: PPO | Source: Ambulatory Visit | Attending: Ophthalmology | Admitting: Ophthalmology

## 2017-10-15 ENCOUNTER — Ambulatory Visit: Payer: PPO | Admitting: Certified Registered"

## 2017-10-15 ENCOUNTER — Other Ambulatory Visit: Payer: Self-pay

## 2017-10-15 ENCOUNTER — Encounter
Admission: RE | Disposition: A | Discharge status: Home / Self Care | Payer: Self-pay | Source: Ambulatory Visit | Attending: Ophthalmology

## 2017-10-15 DIAGNOSIS — H2511 Age-related nuclear cataract, right eye: Secondary | ICD-10-CM | POA: Diagnosis not present

## 2017-10-15 DIAGNOSIS — F419 Anxiety disorder, unspecified: Secondary | ICD-10-CM | POA: Diagnosis not present

## 2017-10-15 DIAGNOSIS — F329 Major depressive disorder, single episode, unspecified: Secondary | ICD-10-CM | POA: Diagnosis not present

## 2017-10-15 HISTORY — PX: CATARACT EXTRACTION W/PHACO: SHX586

## 2017-10-15 SURGERY — PHACOEMULSIFICATION, CATARACT, WITH IOL INSERTION
Anesthesia: Monitor Anesthesia Care | Site: Eye | Laterality: Right | Wound class: Clean

## 2017-10-15 MED ORDER — GLYCOPYRROLATE 0.2 MG/ML IJ SOLN
INTRAMUSCULAR | Status: DC | PRN
  Administered 2017-10-15 (×2): 0.1 mg via INTRAVENOUS

## 2017-10-15 MED ORDER — SODIUM CHLORIDE 0.9 % IV SOLN
INTRAVENOUS | Status: DC
  Administered 2017-10-15: 11:00:00 via INTRAVENOUS

## 2017-10-15 MED ORDER — MOXIFLOXACIN HCL 0.5 % OP SOLN
OPHTHALMIC | Status: AC
  Filled 2017-10-15: qty 3

## 2017-10-15 MED ORDER — CARBACHOL 0.01 % IO SOLN
INTRAOCULAR | Status: DC | PRN
  Administered 2017-10-15: .5 mL via INTRAOCULAR

## 2017-10-15 MED ORDER — ONDANSETRON HCL 4 MG/2ML IJ SOLN: 4.0000 mg | Freq: Once | INTRAMUSCULAR | Status: DC | PRN

## 2017-10-15 MED ORDER — GLYCOPYRROLATE 0.2 MG/ML IJ SOLN
INTRAMUSCULAR | Status: AC
  Filled 2017-10-15: qty 1

## 2017-10-15 MED ORDER — MOXIFLOXACIN HCL 0.5 % OP SOLN
OPHTHALMIC | Status: DC | PRN
  Administered 2017-10-15: .2 mL via OPHTHALMIC

## 2017-10-15 MED ORDER — LIDOCAINE HCL (PF) 4 % IJ SOLN
INTRAOCULAR | Status: DC | PRN
  Administered 2017-10-15: 2 mL via OPHTHALMIC

## 2017-10-15 MED ORDER — MOXIFLOXACIN HCL 0.5 % OP SOLN: 1.0000 [drp] | OPHTHALMIC | Status: DC | PRN

## 2017-10-15 MED ORDER — EPINEPHRINE PF 1 MG/ML IJ SOLN
INTRAMUSCULAR | Status: AC
  Filled 2017-10-15: qty 1

## 2017-10-15 MED ORDER — NA CHONDROIT SULF-NA HYALURON 40-17 MG/ML IO SOLN
INTRAOCULAR | Status: AC
  Filled 2017-10-15: qty 1

## 2017-10-15 MED ORDER — FENTANYL CITRATE (PF) 100 MCG/2ML IJ SOLN
INTRAMUSCULAR | Status: AC
  Filled 2017-10-15: qty 2

## 2017-10-15 MED ORDER — DEXMEDETOMIDINE HCL 200 MCG/2ML IV SOLN
INTRAVENOUS | Status: DC | PRN
  Administered 2017-10-15: 8 ug via INTRAVENOUS
  Administered 2017-10-15: 12 ug via INTRAVENOUS

## 2017-10-15 MED ORDER — POVIDONE-IODINE 5 % OP SOLN
OPHTHALMIC | Status: DC | PRN
  Administered 2017-10-15: 1 via OPHTHALMIC

## 2017-10-15 MED ORDER — ARMC OPHTHALMIC DILATING DROPS
1.0000 "application " | OPHTHALMIC | Status: AC | PRN
  Administered 2017-10-15 (×3): 1 via OPHTHALMIC

## 2017-10-15 MED ORDER — EPINEPHRINE PF 1 MG/ML IJ SOLN
INTRAOCULAR | Status: DC | PRN
  Administered 2017-10-15: 1 mL via OPHTHALMIC

## 2017-10-15 MED ORDER — FENTANYL CITRATE (PF) 100 MCG/2ML IJ SOLN
INTRAMUSCULAR | Status: DC | PRN
  Administered 2017-10-15 (×2): 50 ug via INTRAVENOUS

## 2017-10-15 MED ORDER — POVIDONE-IODINE 5 % OP SOLN
OPHTHALMIC | Status: AC
  Filled 2017-10-15: qty 30

## 2017-10-15 MED ORDER — NA CHONDROIT SULF-NA HYALURON 40-17 MG/ML IO SOLN
INTRAOCULAR | Status: DC | PRN
  Administered 2017-10-15: 1 mL via INTRAOCULAR

## 2017-10-15 MED ORDER — LIDOCAINE HCL (PF) 4 % IJ SOLN
INTRAMUSCULAR | Status: AC
  Filled 2017-10-15: qty 5

## 2017-10-15 SURGICAL SUPPLY — 16 items
GLOVE BIO SURGEON STRL SZ8 (GLOVE) ×2 IMPLANT
GLOVE BIOGEL M 6.5 STRL (GLOVE) ×2 IMPLANT
GLOVE SURG LX 8.0 MICRO (GLOVE) ×1
GLOVE SURG LX STRL 8.0 MICRO (GLOVE) ×1 IMPLANT
GOWN STRL REUS W/ TWL LRG LVL3 (GOWN DISPOSABLE) ×2 IMPLANT
GOWN STRL REUS W/TWL LRG LVL3 (GOWN DISPOSABLE) ×2
LABEL CATARACT MEDS ST (LABEL) ×2 IMPLANT
LENS IOL TECNIS ITEC 23.0 (Intraocular Lens) ×2 IMPLANT
PACK CATARACT (MISCELLANEOUS) ×2 IMPLANT
PACK CATARACT BRASINGTON LX (MISCELLANEOUS) ×2 IMPLANT
PACK EYE AFTER SURG (MISCELLANEOUS) ×2 IMPLANT
SOL BSS BAG (MISCELLANEOUS) ×2
SOLUTION BSS BAG (MISCELLANEOUS) ×1 IMPLANT
SYR 5ML LL (SYRINGE) ×2 IMPLANT
WATER STERILE IRR 250ML POUR (IV SOLUTION) ×2 IMPLANT
WIPE NON LINTING 3.25X3.25 (MISCELLANEOUS) ×2 IMPLANT

## 2017-10-15 NOTE — Op Note (Signed)
PREOPERATIVE DIAGNOSIS:  Nuclear sclerotic cataract of the right eye.   POSTOPERATIVE DIAGNOSIS:  nuclear sclerotic cataract right eye   OPERATIVE PROCEDURE: Procedure(s): CATARACT EXTRACTION PHACO AND INTRAOCULAR LENS PLACEMENT (I[facility])   SURGEON:  Birder Robson, MD.   ANESTHESIA:  Anesthesiologist: Gunnar Fusi, MD CRNA: Nile Riggs, CRNA  1.      Managed anesthesia care. 2.      0.52ml of Shugarcaine was instilled in the eye following the paracentesis.   COMPLICATIONS:  None.   TECHNIQUE:   Stop and chop   DESCRIPTION OF PROCEDURE:  The patient was examined and consented in the preoperative holding area where the aforementioned topical anesthesia was applied to the right eye and then brought back to the Operating Room where the right eye was prepped and draped in the usual sterile ophthalmic fashion and a lid speculum was placed. A paracentesis was created with the side port blade and the anterior chamber was filled with viscoelastic. A near clear corneal incision was performed with the steel keratome. A continuous curvilinear capsulorrhexis was performed with a cystotome followed by the capsulorrhexis forceps. Hydrodissection and hydrodelineation were carried out with BSS on a blunt cannula. The lens was removed in a stop and chop  technique and the remaining cortical material was removed with the irrigation-aspiration handpiece. The capsular bag was inflated with viscoelastic and the Technis ZCB00  lens was placed in the capsular bag without complication. The remaining viscoelastic was removed from the eye with the irrigation-aspiration handpiece. The wounds were hydrated. The anterior chamber was flushed with Miostat and the eye was inflated to physiologic pressure. 0.74ml of Vigamox was placed in the anterior chamber. The wounds were found to be water tight. The eye was dressed with Vigamox. The patient was given protective glasses to wear throughout the day and a shield with  which to sleep tonight. The patient was also given drops with which to begin a drop regimen today and will follow-up with me in one day. Implant Name Type Inv. Item Serial No. Manufacturer Lot No. LRB No. Used  LENS IOL DIOP 23.0 - [id number] Intraocular Lens LENS IOL DIOP 23.0 [id number]  Right 1   Procedure(s) with comments: CATARACT EXTRACTION PHACO AND INTRAOCULAR LENS PLACEMENT (IOC) (Right) - [place]  00:44 AP% 16.1 CDE 7.23 Fluid pack lot # [id number]  Electronically signed: Birder Robson 10/15/2017 12:38 PM

## 2017-10-15 NOTE — Transfer of Care (Signed)
Immediate Anesthesia Transfer of Care Note  Patient: Bonnie Hancock  Procedure(s) Performed: CATARACT EXTRACTION PHACO AND INTRAOCULAR LENS PLACEMENT ([facility]) (Right Eye)  Patient Location: Short Stay  Anesthesia Type:MAC  Level of Consciousness: awake, alert , oriented and patient cooperative  Airway & Oxygen Therapy: Patient Spontanous Breathing  Post-op Assessment: Report given to RN, Post -op Vital signs reviewed and stable and Patient moving all extremities  Post vital signs: Reviewed and stable  Last Vitals:  Vitals Value Taken Time  BP 126/48 10/15/2017 12:30 PM  Temp 36.3 C 10/15/2017 12:30 PM  Pulse 66 10/15/2017 12:30 PM  Resp 17 10/15/2017 12:30 PM  SpO2 95 % 10/15/2017 12:30 PM    Last Pain:  Vitals:   10/15/17 1046  TempSrc: Temporal  PainSc: 0-No pain         Complications: No apparent anesthesia complications

## 2017-10-15 NOTE — Discharge Instructions (Signed)
Eye Surgery Discharge Instructions    Expect mild scratchy sensation or mild soreness. DO NOT RUB YOUR EYE!  The day of surgery:  Minimal physical activity, but bed rest is not required  No reading, computer work, or close hand work  No bending, lifting, or straining.  May watch TV  For 24 hours:  No driving, legal decisions, or alcoholic beverages  Safety precautions  Eat anything you prefer: It is better to start with liquids, then soup then solid foods.  _____ Eye patch should be worn until postoperative exam tomorrow.  ____ Solar shield eyeglasses should be worn for comfort in the sunlight/patch while sleeping  Resume all regular medications including aspirin or Coumadin if these were discontinued prior to surgery. You may shower, bathe, shave, or wash your hair. Tylenol may be taken for mild discomfort.  Call your doctor if you experience significant pain, nausea, or vomiting, fever > 101 or other signs of infection. [phone] or [phone] Specific instructions:  Follow-up Information    Birder Robson, MD Follow up.   Specialty:  Ophthalmology Why:  August 28 at 10:10am Contact information: [address] [place] [address] [phone]         Eye Surgery Discharge Instructions    Expect mild scratchy sensation or mild soreness. DO NOT RUB YOUR EYE!  The day of surgery:  Minimal physical activity, but bed rest is not required  No reading, computer work, or close hand work  No bending, lifting, or straining.  May watch TV  For 24 hours:  No driving, legal decisions, or alcoholic beverages  Safety precautions  Eat anything you prefer: It is better to start with liquids, then soup then solid foods.  _____ Eye patch should be worn until postoperative exam tomorrow.  ____ Solar shield eyeglasses should be worn for comfort in the sunlight/patch while sleeping  Resume all regular medications including aspirin or Coumadin if  these were discontinued prior to surgery. You may shower, bathe, shave, or wash your hair. Tylenol may be taken for mild discomfort.  Call your doctor if you experience significant pain, nausea, or vomiting, fever > 101 or other signs of infection. [phone] or [phone] Specific instructions:  Follow-up Information    Birder Robson, MD Follow up.   Specialty:  Ophthalmology Why:  August 28 at 10:10am Contact information: [address] [place] [address] [phone]

## 2017-10-15 NOTE — Anesthesia Post-op Follow-up Note (Signed)
Anesthesia QCDR form completed.        

## 2017-10-15 NOTE — Addendum Note (Signed)
Addendum  created 10/15/17 1247 by Gunnar Fusi, MD   Attestation recorded in [place], [place] filed

## 2017-10-15 NOTE — Anesthesia Preprocedure Evaluation (Signed)
Anesthesia Evaluation  Patient identified by MRN, date of birth, ID band Patient awake    Reviewed: Allergy & Precautions, NPO status , Patient's Chart, lab work & pertinent test results  History of Anesthesia Complications Negative for: history of anesthetic complications  Airway Mallampati: III       Dental  (+) Missing, Chipped   Pulmonary neg sleep apnea, neg COPD,           Cardiovascular (-) hypertension(-) Past MI and (-) CHF (-) dysrhythmias + Valvular Problems/Murmurs (murmur, no tx)      Neuro/Psych neg Seizures Anxiety Depression    GI/Hepatic Neg liver ROS, neg GERD  ,  Endo/Other  neg diabetesHypothyroidism   Renal/GU negative Renal ROS     Musculoskeletal   Abdominal   Peds  Hematology   Anesthesia Other Findings   Reproductive/Obstetrics                             Anesthesia Physical Anesthesia Plan  ASA: II  Anesthesia Plan: MAC   Post-op Pain Management:    Induction:   PONV Risk Score and Plan:   Airway Management Planned: Nasal Cannula  Additional Equipment:   Intra-op Plan:   Post-operative Plan:   Informed Consent: I have reviewed the patients History and Physical, chart, labs and discussed the procedure including the risks, benefits and alternatives for the proposed anesthesia with the patient or authorized representative who has indicated his/her understanding and acceptance.     Plan Discussed with:   Anesthesia Plan Comments:         Anesthesia Quick Evaluation

## 2017-10-15 NOTE — Anesthesia Postprocedure Evaluation (Signed)
Anesthesia Post Note  Patient: Bonnie Hancock  Procedure(s) Performed: CATARACT EXTRACTION PHACO AND INTRAOCULAR LENS PLACEMENT ([facility]) (Right Eye)  Patient location during evaluation: Short Stay Anesthesia Type: MAC Level of consciousness: awake and alert, oriented and patient cooperative Pain management: satisfactory to patient Vital Signs Assessment: post-procedure vital signs reviewed and stable Respiratory status: spontaneous breathing and respiratory function stable Cardiovascular status: blood pressure returned to baseline and stable Postop Assessment: no headache, no backache, patient able to bend at knees, no apparent nausea or vomiting, adequate PO intake and able to ambulate Anesthetic complications: no     Last Vitals:  Vitals:   10/15/17 1046 10/15/17 1230  BP: (!) 143/79 (!) 126/48  Pulse: 63 66  Resp: 18 17  Temp: (!) 35.7 C (!) 36.3 C  SpO2: 97% 95%    Last Pain:  Vitals:   10/15/17 1230  TempSrc:   PainSc: 0-No pain                 [name] [name]

## 2017-10-15 NOTE — H&P (Signed)
Bonnie labs Hancock. Abnormal studies sent to patients PCP when indicated.  Previous H&P Hancock, Bonnie Hancock, Bonnie are NO CHANGES.  [name]8/27/201912:14 PM

## 2017-10-22 ENCOUNTER — Other Ambulatory Visit: Payer: Self-pay | Admitting: Physical Medicine and Rehabilitation

## 2017-10-22 DIAGNOSIS — M5030 Other cervical disc degeneration, unspecified cervical region: Secondary | ICD-10-CM | POA: Diagnosis not present

## 2017-10-22 DIAGNOSIS — M5136 Other intervertebral disc degeneration, lumbar region: Secondary | ICD-10-CM | POA: Diagnosis not present

## 2017-10-22 DIAGNOSIS — M5442 Lumbago with sciatica, left side: Secondary | ICD-10-CM | POA: Diagnosis not present

## 2017-10-22 DIAGNOSIS — M5412 Radiculopathy, cervical region: Secondary | ICD-10-CM | POA: Diagnosis not present

## 2017-10-22 DIAGNOSIS — M5441 Lumbago with sciatica, right side: Secondary | ICD-10-CM | POA: Diagnosis not present

## 2017-10-22 DIAGNOSIS — M5416 Radiculopathy, lumbar region: Secondary | ICD-10-CM

## 2017-10-31 ENCOUNTER — Ambulatory Visit
Admission: RE | Admit: 2017-10-31 | Discharge: 2017-10-31 | Disposition: A | Discharge status: Home / Self Care | Payer: PPO | Source: Ambulatory Visit | Attending: Physical Medicine and Rehabilitation | Admitting: Physical Medicine and Rehabilitation

## 2017-10-31 DIAGNOSIS — M48061 Spinal stenosis, lumbar region without neurogenic claudication: Secondary | ICD-10-CM | POA: Diagnosis not present

## 2017-10-31 DIAGNOSIS — M545 Low back pain: Secondary | ICD-10-CM | POA: Diagnosis not present

## 2017-10-31 DIAGNOSIS — M4316 Spondylolisthesis, lumbar region: Secondary | ICD-10-CM | POA: Insufficient documentation

## 2017-10-31 DIAGNOSIS — M5416 Radiculopathy, lumbar region: Secondary | ICD-10-CM | POA: Insufficient documentation

## 2017-10-31 DIAGNOSIS — M5126 Other intervertebral disc displacement, lumbar region: Secondary | ICD-10-CM | POA: Insufficient documentation

## 2017-11-26 DIAGNOSIS — M48062 Spinal stenosis, lumbar region with neurogenic claudication: Secondary | ICD-10-CM | POA: Diagnosis not present

## 2017-11-26 DIAGNOSIS — M5136 Other intervertebral disc degeneration, lumbar region: Secondary | ICD-10-CM | POA: Diagnosis not present

## 2017-11-26 DIAGNOSIS — M5416 Radiculopathy, lumbar region: Secondary | ICD-10-CM | POA: Diagnosis not present

## 2017-12-25 DIAGNOSIS — M5136 Other intervertebral disc degeneration, lumbar region: Secondary | ICD-10-CM | POA: Diagnosis not present

## 2017-12-25 DIAGNOSIS — M5416 Radiculopathy, lumbar region: Secondary | ICD-10-CM | POA: Diagnosis not present

## 2017-12-25 DIAGNOSIS — M48062 Spinal stenosis, lumbar region with neurogenic claudication: Secondary | ICD-10-CM | POA: Diagnosis not present

## 2018-01-21 DIAGNOSIS — M5441 Lumbago with sciatica, right side: Secondary | ICD-10-CM | POA: Diagnosis not present

## 2018-01-21 DIAGNOSIS — M48062 Spinal stenosis, lumbar region with neurogenic claudication: Secondary | ICD-10-CM | POA: Diagnosis not present

## 2018-01-21 DIAGNOSIS — M5136 Other intervertebral disc degeneration, lumbar region: Secondary | ICD-10-CM | POA: Diagnosis not present

## 2018-01-21 DIAGNOSIS — M4802 Spinal stenosis, cervical region: Secondary | ICD-10-CM | POA: Diagnosis not present

## 2018-01-21 DIAGNOSIS — M5416 Radiculopathy, lumbar region: Secondary | ICD-10-CM | POA: Diagnosis not present

## 2018-01-21 DIAGNOSIS — M5442 Lumbago with sciatica, left side: Secondary | ICD-10-CM | POA: Diagnosis not present

## 2018-01-21 DIAGNOSIS — M5030 Other cervical disc degeneration, unspecified cervical region: Secondary | ICD-10-CM | POA: Diagnosis not present

## 2018-01-21 DIAGNOSIS — M5412 Radiculopathy, cervical region: Secondary | ICD-10-CM | POA: Diagnosis not present

## 2018-01-31 DIAGNOSIS — E039 Hypothyroidism, unspecified: Secondary | ICD-10-CM | POA: Diagnosis not present

## 2018-01-31 DIAGNOSIS — E785 Hyperlipidemia, unspecified: Secondary | ICD-10-CM | POA: Diagnosis not present

## 2018-02-05 DIAGNOSIS — M5134 Other intervertebral disc degeneration, thoracic region: Secondary | ICD-10-CM | POA: Diagnosis not present

## 2018-02-05 DIAGNOSIS — E785 Hyperlipidemia, unspecified: Secondary | ICD-10-CM | POA: Diagnosis not present

## 2018-02-05 DIAGNOSIS — E038 Other specified hypothyroidism: Secondary | ICD-10-CM | POA: Diagnosis not present

## 2018-04-01 ENCOUNTER — Other Ambulatory Visit: Payer: Self-pay | Admitting: Cardiovascular Disease

## 2018-05-08 ENCOUNTER — Telehealth: Payer: Self-pay

## 2018-05-08 MED ORDER — FUROSEMIDE 20 MG PO TABS: 20.0000 mg | ORAL_TABLET | Freq: Two times a day (BID) | ORAL | 3 refills | Status: DC

## 2018-05-08 MED ORDER — EZETIMIBE 10 MG PO TABS: 10.0000 mg | ORAL_TABLET | Freq: Every day | ORAL | 3 refills | Status: DC

## 2018-05-08 NOTE — Telephone Encounter (Signed)
Call to patient to reschedule in regards to COVID-19 restrictions.  ?  Pt is comfortable with rescheduling at this time and denies any new, urgent or worsening sx.  ?  Message routed to cv div [id number] pool for rescheduling.    COVID-19 Pre-Screening:  1. Have you been in contact with someone who was sick?  no 2. Do you have any of the following symptoms (cough, fever, muscle pain, vomiting, diarrhea, weakness abdominal pain, rash, red eye, bruising or bleeding, joint pain, severe headache)?  no 3. Have you travelled internationally or out of state in the last month?  no 4. Do you need any refills at this time? Done   Patient aware of the following: Please be advised that we require, no one but yourself to come to appointment. If necessary, only one visitor may come with you into the building. They will also be asked the same screening questions. You will be contacted at a later time to reschedule. However, this will depend on ongoing evaluation of the Covid-19 situation.  Please call us if any new questions or concerns arise. We are here for advice.  Routing to COVID cancel pool.

## 2018-05-12 ENCOUNTER — Ambulatory Visit: Payer: PPO | Admitting: Cardiovascular Disease

## 2018-05-22 NOTE — Telephone Encounter (Signed)
Spoke with patient. She does not want and E-visit at this time. She will contact us if she has any concerns. Will keep in [place] pool for scheduling purposes once in-person visits return.

## 2018-05-29 NOTE — Telephone Encounter (Signed)
Recall placed for August.

## 2018-07-09 ENCOUNTER — Encounter: Payer: Self-pay | Admitting: Physician Assistant

## 2018-07-09 NOTE — Progress Notes (Deleted)
Cardiology Office Note Date:  07/09/2018  Patient ID:  Bonnie, Hancock [birth year], MRN [id number] PCP:  Albina Billet, MD  Cardiologist:  Dr. Rockey Situ, MD  ***refresh   Chief Complaint: Follow up  History of Present Illness: Bonnie Hancock is a 78 y.o. female with history of HFpEF, pulmonary hypertension, chronic SOB, HLD, hypothyroidism, morbid obesity, depression and anxiety who presents for follow up of her HFpEF.   Remote echo from 08/2010 showed a normal LV systolic function with an EF of 55-65%, no RWMA, Gr1DD, no significant valvular disease, and mild pulmonary hypertension with a PASP of 47 mmHg.   She was last seen in the office in 07/2016 for follow up and was concerned she had been "gaining fluid." She reported taking Lasix daily at that time. Weight was noted to be 193 pounds which was up only 3 pounds when compared to her visit in 07/2015 at which time she was felt to be euvolemic.   No recent labs on file for review.   ***   Past Medical History:  Diagnosis Date  . Anxiety   . Arthritis   . Cancer (HCC)    UTERINE  . Depression   . Dyspnea   . Heart murmur   . HOH (hard of hearing)   . Hyperlipidemia   . Hypothyroidism     Past Surgical History:  Procedure Laterality Date  . APPENDECTOMY    . CATARACT EXTRACTION W/PHACO Left 09/17/2017   Procedure: CATARACT EXTRACTION PHACO AND INTRAOCULAR LENS PLACEMENT (IOC);  Surgeon: Birder Robson, MD;  Location: [facility] ORS;  Service: Ophthalmology;  Laterality: Left;  [place] 00:48 AP% 13.0 CDE 6.26 Fluid pack lot # [id number]  . CATARACT EXTRACTION W/PHACO Right 10/15/2017   Procedure: CATARACT EXTRACTION PHACO AND INTRAOCULAR LENS PLACEMENT (IOC);  Surgeon: Birder Robson, MD;  Location: [facility] ORS;  Service: Ophthalmology;  Laterality: Right;  [place]  00:44 AP% 16.1 CDE 7.23 Fluid pack lot # [id number]  . CHOLECYSTECTOMY      No outpatient medications have been marked as taking for the 07/15/18 encounter (Appointment)  with Rise Mu, PA-C.    Allergies:   Statins   Social History:  The patient  reports that she has never smoked. She has never used smokeless tobacco. She reports previous alcohol use. She reports that she does not use drugs.   Family History:  The patient's family history includes Emphysema in her mother; Heart attack in her father; Heart disease in her mother.  ROS:   ROS   PHYSICAL EXAM: *** VS:  LMP  (LMP Unknown)  BMI: There is no height or weight on file to calculate BMI.  Physical Exam   EKG:  Was ordered and interpreted by me today. Shows ***  Recent Labs: No results found for requested labs within last 8760 hours.  No results found for requested labs within last 8760 hours.   CrCl cannot be calculated (Patient's most recent lab result is older than the maximum 21 days allowed.).   Wt Readings from Last 3 Encounters:  10/15/17 185 lb (83.9 kg)  09/17/17 185 lb (83.9 kg)  07/23/16 193 lb 12 oz (87.9 kg)     Other studies reviewed: Additional studies/records reviewed today include: summarized above  ASSESSMENT AND PLAN:  1. ***  Disposition: F/u with Dr. Marland Kitchen or an APP in ***.  Current medicines are reviewed at length with the patient today.  The patient did not have any concerns regarding medicines.  Signed, Thurmond Butts  Idolina Primer, PA-C 07/09/2018 2:38 PM     [place] [address] [place], [place] [address] [phone]

## 2018-07-11 ENCOUNTER — Telehealth: Payer: Self-pay

## 2018-07-11 NOTE — Telephone Encounter (Signed)
Call to patient to prescreen prior OV on 5/26. No answer. LMTCB

## 2018-07-15 ENCOUNTER — Ambulatory Visit: Payer: PPO | Admitting: Physician Assistant

## 2018-07-17 ENCOUNTER — Telehealth: Payer: Self-pay | Admitting: *Deleted

## 2018-07-17 NOTE — Telephone Encounter (Signed)

## 2018-07-18 DIAGNOSIS — E039 Hypothyroidism, unspecified: Secondary | ICD-10-CM | POA: Diagnosis not present

## 2018-07-18 DIAGNOSIS — E785 Hyperlipidemia, unspecified: Secondary | ICD-10-CM | POA: Diagnosis not present

## 2018-07-18 NOTE — Progress Notes (Deleted)
Cardiology Office Note Date:  07/18/2018  Patient ID:  Bonnie Hancock, Bonnie Hancock 29-Sep-1940, MRN [id number] PCP:  Albina Billet, MD  Cardiologist:  Dr. Rockey Situ, MD  ***refresh   Chief Complaint: Follow up  History of Present Illness: Bonnie Hancock is a 78 y.o. female with history of HFpEF, PAH, obesity, HTN, HLD intolerant to statins on Zetia, hypothyroidism, anxiety, and depression who presents for follow up of HFpEF.  Prior echo in 08/2010 showed a normal LVSF, normal valvular function with no significant disease, mildly elevated RVSP consistent with mild PAH. No prior ischemic testing on file. She was most recently seen in 07/2016 for routine follow up with a documented weight of 193 pounds. She reported taking Lasix daily as she felt like she was gaining fluid with some swelling in her legs and fingers over the summer months. Her SOB was felt to be secondary to deconditioning. No recent labs for review.   ***   Past Medical History:  Diagnosis Date  . Anxiety   . Arthritis   . Cancer (HCC)    UTERINE  . Depression   . Dyspnea   . Heart murmur   . HOH (hard of hearing)   . Hyperlipidemia   . Hypothyroidism     Past Surgical History:  Procedure Laterality Date  . APPENDECTOMY    . CATARACT EXTRACTION W/PHACO Left 09/17/2017   Procedure: CATARACT EXTRACTION PHACO AND INTRAOCULAR LENS PLACEMENT (IOC);  Surgeon: Birder Robson, MD;  Location: [facility];  Service: Ophthalmology;  Laterality: Left;  [place] 00:48 AP% 13.0 CDE 6.26 Fluid pack lot # [id number]  . CATARACT EXTRACTION W/PHACO Right 10/15/2017   Procedure: CATARACT EXTRACTION PHACO AND INTRAOCULAR LENS PLACEMENT (IOC);  Surgeon: Birder Robson, MD;  Location: [facility];  Service: Ophthalmology;  Laterality: Right;  [place]  00:44 AP% 16.1 CDE 7.23 Fluid pack lot # [id number] H  . CHOLECYSTECTOMY      No outpatient medications have been marked as taking for the 07/22/18 encounter (Appointment) with Rise Mu, PA-C.     Allergies:   Statins   Social History:  The patient  reports that she has never smoked. She has never used smokeless tobacco. She reports previous alcohol use. She reports that she does not use drugs.   Family History:  The patient's family history includes Emphysema in her mother; Heart attack in her father; Heart disease in her mother.  ROS:   ROS   PHYSICAL EXAM: *** VS:  LMP  (LMP Unknown)  BMI: There is no height or weight on file to calculate BMI.  Physical Exam   EKG:  Was ordered and interpreted by me today. Shows ***  Recent Labs: No results found for requested labs within last 8760 hours.  No results found for requested labs within last 8760 hours.   CrCl cannot be calculated (Patient's most recent lab result is older than the maximum 21 days allowed.).   Wt Readings from Last 3 Encounters:  10/15/17 185 lb (83.9 kg)  09/17/17 185 lb (83.9 kg)  07/23/16 193 lb 12 oz (87.9 kg)     Other studies reviewed: Additional studies/records reviewed today include: summarized above  ASSESSMENT AND PLAN:  1. ***  Disposition: F/u with Dr. Rockey Situ or an APP in ***.  Current medicines are reviewed at length with the patient today.  The patient did not have any concerns regarding medicines.  Signed, Christell Faith, PA-C 07/18/2018 4:35 PM     [facility] [address]  [place], [place] [address] [phone]

## 2018-07-22 ENCOUNTER — Ambulatory Visit: Payer: PPO | Admitting: Physician Assistant

## 2018-07-23 DIAGNOSIS — E038 Other specified hypothyroidism: Secondary | ICD-10-CM | POA: Diagnosis not present

## 2018-07-23 DIAGNOSIS — E785 Hyperlipidemia, unspecified: Secondary | ICD-10-CM | POA: Diagnosis not present

## 2018-07-23 DIAGNOSIS — F418 Other specified anxiety disorders: Secondary | ICD-10-CM | POA: Diagnosis not present

## 2018-09-30 ENCOUNTER — Ambulatory Visit: Payer: PPO | Admitting: Cardiovascular Disease

## 2019-01-02 DIAGNOSIS — E785 Hyperlipidemia, unspecified: Secondary | ICD-10-CM | POA: Diagnosis not present

## 2019-01-02 DIAGNOSIS — E039 Hypothyroidism, unspecified: Secondary | ICD-10-CM | POA: Diagnosis not present

## 2019-01-07 DIAGNOSIS — E785 Hyperlipidemia, unspecified: Secondary | ICD-10-CM | POA: Diagnosis not present

## 2019-01-07 DIAGNOSIS — M5134 Other intervertebral disc degeneration, thoracic region: Secondary | ICD-10-CM | POA: Diagnosis not present

## 2019-01-07 DIAGNOSIS — E038 Other specified hypothyroidism: Secondary | ICD-10-CM | POA: Diagnosis not present

## 2019-04-26 NOTE — Progress Notes (Signed)
Patient ID: Bonnie Hancock, female   DOB: Oct 02, 1940, 79 y.o.   MRN: [id number] Cardiology Office Note  Date:  04/27/2019   ID:  Bonnie Hancock, DOB October 05, 1940, MRN [id number]  PCP:  Bonnie Billet, MD   Chief Complaint  Patient presents with  . other    6 month follow up. Meds reviewed by the pt. verbally. "doing well."     HPI:  Bonnie Hancock is a 79 year-old woman,  With history of  shortness of breath with exertion,  Chronic diastolic CHF obesity,  presenting for routine followup  Of her shortness of breath, leg swelling, hyperlipidemia  Overall doing well History of chronic neck pain, shoulder pain Getting worse  No edema on today's visit Taking lasix 20 BID No recent lab work available to review Takes second dose before bed, urinates once a night  Walks in house, No regular exercise program  Some swelling in her legs and fingers this summer, resolved   continues to take zetia  Once a day, no side effects No lipids available for review We did call primary care to confirm her medications  No regular exercise program Denies any palpitations, chest pain, shortness of breath on exertion   EKG personally reviewed by myself on todays visit Shows normal sinus rhythm with rate 63 bpm no significant ST or T-wave changes   Other past medical history reviewed  periodic episodes of weakness, usually associated with exertion, has to sit down and recover on those episodes will feel hot, wonders if her sugar is running low, swoons.    No smoking history.   Prior lab work shows total cholesterol 295, LDL 215,  in 2015  Echocardiogram done in July 2012 shows normal LV systolic function, normal valvular function with no significant disease, mildly elevated right ventricular systolic pressures consistent with mild pulmonary hypertension.   PMH:   has a past medical history of Anxiety, Arthritis, Cancer ([place]), Depression, Dyspnea, Heart murmur, HOH (hard of hearing),  Hyperlipidemia, and Hypothyroidism.  PSH:    Past Surgical History:  Procedure Laterality Date  . APPENDECTOMY    . CATARACT EXTRACTION W/PHACO Left 09/17/2017   Procedure: CATARACT EXTRACTION PHACO AND INTRAOCULAR LENS PLACEMENT (IOC);  Surgeon: Birder Robson, MD;  Location: [facility];  Service: Ophthalmology;  Laterality: Left;  [place] 00:48 AP% 13.0 CDE 6.26 Fluid pack lot # [id number]  . CATARACT EXTRACTION W/PHACO Right 10/15/2017   Procedure: CATARACT EXTRACTION PHACO AND INTRAOCULAR LENS PLACEMENT (IOC);  Surgeon: Birder Robson, MD;  Location: [facility];  Service: Ophthalmology;  Laterality: Right;  [place]  00:44 AP% 16.1 CDE 7.23 Fluid pack lot # [id number]  . CHOLECYSTECTOMY      Current Outpatient Medications  Medication Sig Dispense Refill  . acetaminophen (TYLENOL) 500 MG tablet Take 500 mg by mouth every 6 (six) hours as needed for moderate pain or headache.    Marland Kitchen aspirin 81 MG tablet Take 81 mg by mouth daily.    Marland Kitchen escitalopram (LEXAPRO) 10 MG tablet Take 15 mg by mouth daily.     Marland Kitchen ezetimibe (ZETIA) 10 MG tablet Take 1 tablet (10 mg total) by mouth daily. 90 tablet 3  . furosemide (LASIX) 20 MG tablet Take 1 tablet (20 mg total) by mouth 2 (two) times daily. 180 tablet 3  . levothyroxine (SYNTHROID, LEVOTHROID) 50 MCG tablet Take 50 mcg by mouth daily before breakfast.    . potassium chloride (MICRO-K) 10 MEQ CR capsule Take 10 mEq by mouth 2 (two) times  daily.  60 capsule 3   No current facility-administered medications for this visit.     Allergies:   Statins   Social History:  The patient  reports that she has never smoked. She has never used smokeless tobacco. She reports previous alcohol use. She reports that she does not use drugs.   Family History:   family history includes Emphysema in her mother; Heart attack in her father; Heart disease in her mother.    Review of Systems: Review of Systems  Constitutional: Negative.   HENT: Negative.   Respiratory:  Negative.   Cardiovascular: Negative.   Gastrointestinal: Negative.   Musculoskeletal: Positive for joint pain and neck pain.  Psychiatric/Behavioral: Negative.   All other systems reviewed and are negative.   PHYSICAL EXAM: VS:  BP 100/60 (BP Location: Left Arm, Patient Position: Sitting, Cuff Size: Normal)   Pulse 63   Ht 5\' 2"  (1.575 m)   Wt 184 lb 12 oz (83.8 kg)   LMP  (LMP Unknown)   SpO2 98%   BMI 33.79 kg/m  , BMI Body mass index is 33.79 kg/m.  Constitutional:  oriented to person, place, and time. No distress.  HENT:  Head: Grossly normal Eyes:  no discharge. No scleral icterus.  Neck: No JVD, no carotid bruits  Cardiovascular: Regular rate and rhythm, no murmurs appreciated Pulmonary/Chest: Clear to auscultation bilaterally, no wheezes or rails Abdominal: Soft.  no distension.  no tenderness.  Musculoskeletal: Normal range of motion Neurological:  normal muscle tone. Coordination normal. No atrophy Skin: Skin warm and dry Psychiatric: normal affect, pleasant   Recent Labs: No results found for requested labs within last 8760 hours.   results have been requested   Lipid Panel No results found for: CHOL, HDL, LDLCALC, TRIG  Wt Readings from Last 3 Encounters:  04/27/19 184 lb 12 oz (83.8 kg)  10/15/17 185 lb (83.9 kg)  09/17/17 185 lb (83.9 kg)     ASSESSMENT AND PLAN:   Hyperlipidemia - Plan: EKG 12-Lead Unable to tolerate a statin in the past,  continue on zetia Lipid panel drawn today  SOB (shortness of breath) - Stressed importance of walking program for conditioning Symptoms stable  Chronic diastolic CHF Lasix 20 BID, BMP today , may be up to back down to once a day  Arthritis Neck, shoulders prior cortisone shot  Disposition:   F/U  12 months   Total encounter time more than 25 minutes  Greater than 50% was spent in counseling and coordination of care with the patient    No orders of the defined types were placed in this  encounter.    Signed, Esmond Plants, M.D., Ph.D. 04/27/2019  [place], [place]

## 2019-04-27 ENCOUNTER — Encounter: Payer: Self-pay | Admitting: Cardiovascular Disease

## 2019-04-27 ENCOUNTER — Other Ambulatory Visit: Payer: Self-pay

## 2019-04-27 ENCOUNTER — Ambulatory Visit (INDEPENDENT_AMBULATORY_CARE_PROVIDER_SITE_OTHER): Payer: PPO | Admitting: Cardiovascular Disease

## 2019-04-27 VITALS — BP 100/60 | HR 63 | Ht 62.0 in | Wt 184.8 lb

## 2019-04-27 DIAGNOSIS — I5032 Chronic diastolic (congestive) heart failure: Secondary | ICD-10-CM

## 2019-04-27 DIAGNOSIS — M7989 Other specified soft tissue disorders: Secondary | ICD-10-CM | POA: Diagnosis not present

## 2019-04-27 DIAGNOSIS — R0602 Shortness of breath: Secondary | ICD-10-CM | POA: Diagnosis not present

## 2019-04-27 NOTE — Patient Instructions (Addendum)
Medication Instructions:  No changes  If you need a refill on your cardiac medications before your next appointment, please call your pharmacy.    Lab work: BMP, lipids today   If you have labs (blood work) drawn today and your tests are completely normal, you will receive your results only by: Marland Kitchen MyChart Message (if you have MyChart) OR . A paper copy in the mail If you have any lab test that is abnormal or we need to change your treatment, we will call you to review the results.   Testing/Procedures: No new testing needed   Follow-Up: At [facility], you and your health needs are our priority.  As part of our continuing mission to provide you with exceptional heart care, we have created designated Provider Care Teams.  These Care Teams include your primary Cardiologist (physician) and Advanced Practice Providers (APPs -  Physician Assistants and Nurse Practitioners) who all work together to provide you with the care you need, when you need it.  . You will need a follow up appointment in 12 months   . Providers on your designated Care Team:   . Murray Hodgkins, NP . Christell Faith, PA-C . Marrianne Mood, PA-C  Any Other Special Instructions Will Be Listed Below (If Applicable).  For educational health videos Log in to : [email or web address] Or : [email or web address], password : [email or web address]

## 2019-04-28 LAB — BASIC METABOLIC PANEL
BUN/Creatinine Ratio: 20 (ref 12–28)
BUN: 18 mg/dL (ref 8–27)
CO2: 25 mmol/L (ref 20–29)
Calcium: 9.7 mg/dL (ref 8.7–10.3)
Chloride: 106 mmol/L (ref 96–106)
Creatinine, Ser: 0.91 mg/dL (ref 0.57–1.00)
GFR calc Af Amer: 69 mL/min/{1.73_m2} (ref >59)
GFR calc non Af Amer: 60 mL/min/{1.73_m2} (ref >59)
Glucose: 89 mg/dL (ref 65–99)
Potassium: 4.5 mmol/L (ref 3.5–5.2)
Sodium: 144 mmol/L (ref 134–144)

## 2019-04-28 LAB — LIPID PANEL
Chol/HDL Ratio: 6.1 ratio — ABNORMAL HIGH (ref 0.0–4.4)
Cholesterol, Total: 261 mg/dL — ABNORMAL HIGH (ref 100–199)
HDL: 43 mg/dL (ref >39)
LDL Chol Calc (NIH): 184 mg/dL — ABNORMAL HIGH (ref 0–99)
Triglycerides: 179 mg/dL — ABNORMAL HIGH (ref 0–149)
VLDL Cholesterol Cal: 34 mg/dL (ref 5–40)

## 2019-05-04 ENCOUNTER — Telehealth: Payer: Self-pay

## 2019-05-04 NOTE — Telephone Encounter (Signed)
Tried calling patient. No answer and no voicemail set up for me to leave a message. 

## 2019-05-04 NOTE — Telephone Encounter (Signed)
-----   Message from Minna Merritts, MD sent at 05/02/2019 11:01 AM EST ----- Cholesterol is markedly elevated even on Zetia Options include retrying a cholesterol medication like Crestor 5 mg daily with Zetia If we can document which statins she has tried before with intolerance she might be a candidate for PCSK9 inhibitor  I look back at old images, CT scan 2013 and at that time there was moderate plaque in her aorta This will continue to get worse untreated Can we also let Dr. Hall Busing know of the findings and send him the lab work

## 2019-05-05 ENCOUNTER — Other Ambulatory Visit: Payer: Self-pay | Admitting: *Deleted

## 2019-05-05 ENCOUNTER — Encounter: Payer: Self-pay | Admitting: *Deleted

## 2019-05-05 MED ORDER — ROSUVASTATIN CALCIUM 5 MG PO TABS: 5.0000 mg | ORAL_TABLET | Freq: Every day | ORAL | 3 refills | Status: DC

## 2019-05-05 NOTE — Telephone Encounter (Signed)
No answer. No voicemail. 

## 2019-05-05 NOTE — Telephone Encounter (Signed)
Spoke with patient and reviewed results and recommendations she was agreeable to start the crestor and will send over to [employer] telling her to use GoodRx instead of her insurance. She verbalized understanding with no further questions at this time. I did route this message over to her PCP as well.

## 2019-05-05 NOTE — Telephone Encounter (Signed)
-----   Message from Minna Merritts, MD sent at 05/02/2019 11:01 AM EST ----- Cholesterol is markedly elevated even on Zetia Options include retrying a cholesterol medication like Crestor 5 mg daily with Zetia If we can document which statins she has tried before with intolerance she might be a candidate for PCSK9 inhibitor  I look back at old images, CT scan 2013 and at that time there was moderate plaque in her aorta This will continue to get worse untreated Can we also let Dr. Hall Busing know of the findings and send him the lab work

## 2019-05-06 MED ORDER — ROSUVASTATIN CALCIUM 5 MG PO TABS: 5.0000 mg | ORAL_TABLET | Freq: Every day | ORAL | 3 refills | Status: DC

## 2019-05-06 NOTE — Telephone Encounter (Signed)
   Patient calling to change meds to [facility] [address]

## 2019-05-06 NOTE — Telephone Encounter (Signed)
Spoke with patient and reviewed that she should pick up her Crestor or Rosuvastatin at the [employer] [facility] [employer] so that she can get it for cheaper and it will not affect her donut hole with Medicare prescription plan. She wrote down each step of instructions and read them back to me to make sure that she understood. Let her know to please call back if any other questions.

## 2019-05-06 NOTE — Addendum Note (Signed)
Addended by: Valora Corporal on: 05/06/2019 02:43 PM   Modules accepted: Orders

## 2019-06-02 ENCOUNTER — Other Ambulatory Visit: Payer: Self-pay | Admitting: Cardiovascular Disease

## 2019-06-08 ENCOUNTER — Encounter: Payer: Self-pay | Admitting: Cardiovascular Disease

## 2019-06-08 DIAGNOSIS — E039 Hypothyroidism, unspecified: Secondary | ICD-10-CM | POA: Diagnosis not present

## 2019-06-08 DIAGNOSIS — E785 Hyperlipidemia, unspecified: Secondary | ICD-10-CM | POA: Diagnosis not present

## 2019-06-15 DIAGNOSIS — H903 Sensorineural hearing loss, bilateral: Secondary | ICD-10-CM | POA: Diagnosis not present

## 2019-06-17 DIAGNOSIS — E038 Other specified hypothyroidism: Secondary | ICD-10-CM | POA: Diagnosis not present

## 2019-06-17 DIAGNOSIS — E785 Hyperlipidemia, unspecified: Secondary | ICD-10-CM | POA: Diagnosis not present

## 2019-06-17 DIAGNOSIS — F330 Major depressive disorder, recurrent, mild: Secondary | ICD-10-CM | POA: Diagnosis not present

## 2019-06-17 DIAGNOSIS — M5134 Other intervertebral disc degeneration, thoracic region: Secondary | ICD-10-CM | POA: Diagnosis not present

## 2019-06-22 ENCOUNTER — Encounter: Payer: Self-pay | Admitting: Emergency Medicine

## 2019-06-22 ENCOUNTER — Emergency Department: Payer: PPO

## 2019-06-22 ENCOUNTER — Emergency Department
Admission: EM | Admit: 2019-06-22 | Discharge: 2019-06-22 | Disposition: A | Discharge status: Home / Self Care | Payer: PPO | Attending: Student | Admitting: Student

## 2019-06-22 ENCOUNTER — Other Ambulatory Visit: Payer: Self-pay

## 2019-06-22 DIAGNOSIS — R202 Paresthesia of skin: Secondary | ICD-10-CM | POA: Diagnosis not present

## 2019-06-22 DIAGNOSIS — R61 Generalized hyperhidrosis: Secondary | ICD-10-CM | POA: Insufficient documentation

## 2019-06-22 DIAGNOSIS — R911 Solitary pulmonary nodule: Secondary | ICD-10-CM

## 2019-06-22 DIAGNOSIS — Z7982 Long term (current) use of aspirin: Secondary | ICD-10-CM | POA: Diagnosis not present

## 2019-06-22 DIAGNOSIS — R079 Chest pain, unspecified: Secondary | ICD-10-CM

## 2019-06-22 DIAGNOSIS — R531 Weakness: Secondary | ICD-10-CM | POA: Insufficient documentation

## 2019-06-22 DIAGNOSIS — Z79899 Other long term (current) drug therapy: Secondary | ICD-10-CM | POA: Diagnosis not present

## 2019-06-22 DIAGNOSIS — Z8542 Personal history of malignant neoplasm of other parts of uterus: Secondary | ICD-10-CM | POA: Diagnosis not present

## 2019-06-22 DIAGNOSIS — R0602 Shortness of breath: Secondary | ICD-10-CM | POA: Diagnosis not present

## 2019-06-22 DIAGNOSIS — E039 Hypothyroidism, unspecified: Secondary | ICD-10-CM | POA: Insufficient documentation

## 2019-06-22 DIAGNOSIS — R0600 Dyspnea, unspecified: Secondary | ICD-10-CM | POA: Diagnosis not present

## 2019-06-22 LAB — BASIC METABOLIC PANEL
Anion gap: 9 (ref 5–15)
BUN: 21 mg/dL (ref 8–23)
CO2: 24 mmol/L (ref 22–32)
Calcium: 9.2 mg/dL (ref 8.9–10.3)
Chloride: 105 mmol/L (ref 98–111)
Creatinine, Ser: 0.9 mg/dL (ref 0.44–1.00)
GFR calc Af Amer: >60 mL/min (ref >60)
GFR calc non Af Amer: >60 mL/min (ref >60)
Glucose, Bld: 146 mg/dL — ABNORMAL HIGH (ref 70–99)
Potassium: 3.7 mmol/L (ref 3.5–5.1)
Sodium: 138 mmol/L (ref 135–145)

## 2019-06-22 LAB — URINALYSIS, COMPLETE (UACMP) WITH MICROSCOPIC
Bilirubin Urine: NEGATIVE
Glucose, UA: NEGATIVE mg/dL
Ketones, ur: NEGATIVE mg/dL
Leukocytes,Ua: NEGATIVE
Nitrite: NEGATIVE
Protein, ur: NEGATIVE mg/dL
Specific Gravity, Urine: 1.032 — ABNORMAL HIGH (ref 1.005–1.030)
pH: 5 (ref 5.0–8.0)

## 2019-06-22 LAB — CBC
HCT: 41.8 % (ref 36.0–46.0)
Hemoglobin: 13.9 g/dL (ref 12.0–15.0)
MCH: 30.8 pg (ref 26.0–34.0)
MCHC: 33.3 g/dL (ref 30.0–36.0)
MCV: 92.5 fL (ref 80.0–100.0)
Platelets: 218 10*3/uL (ref 150–400)
RBC: 4.52 MIL/uL (ref 3.87–5.11)
RDW: 13.3 % (ref 11.5–15.5)
WBC: 7.9 10*3/uL (ref 4.0–10.5)
nRBC: 0 % (ref 0.0–0.2)

## 2019-06-22 LAB — GLUCOSE, CAPILLARY: Glucose-Capillary: 91 mg/dL (ref 70–99)

## 2019-06-22 LAB — TROPONIN I (HIGH SENSITIVITY)
Troponin I (High Sensitivity): 3 ng/L (ref <18)
Troponin I (High Sensitivity): 4 ng/L (ref <18)

## 2019-06-22 LAB — BRAIN NATRIURETIC PEPTIDE: B Natriuretic Peptide: 76 pg/mL (ref 0.0–100.0)

## 2019-06-22 LAB — FIBRIN DERIVATIVES D-DIMER (ARMC ONLY): Fibrin derivatives D-dimer (ARMC): 935.81 ng/mL (FEU) — ABNORMAL HIGH (ref 0.00–499.00)

## 2019-06-22 MED ORDER — IOHEXOL 350 MG/ML SOLN
75.0000 mL | Freq: Once | INTRAVENOUS | Status: AC | PRN
  Administered 2019-06-22: 75 mL via INTRAVENOUS

## 2019-06-22 MED ORDER — SODIUM CHLORIDE 0.9% FLUSH
3.0000 mL | Freq: Once | INTRAVENOUS | Status: AC
  Administered 2019-06-22: 16:00:00 3 mL via INTRAVENOUS

## 2019-06-22 NOTE — Discharge Instructions (Addendum)
Thank you for letting us take care of you in the emergency department today.   Please continue to take any regular, prescribed medications.   Please follow up with: Your regular doctor to review your ER visit and follow up on your symptoms.  Your cardiology doctor to review your chest pain We have given you a referral to the pulmonary clinic nodule, as we discussed   Please return to the ER for any new or worsening symptoms.

## 2019-06-22 NOTE — ED Notes (Signed)
Pt transported to CT ?

## 2019-06-22 NOTE — ED Provider Notes (Signed)
[facility] Emergency Department Provider Note  ____________________________________________   First MD Initiated Contact with Patient 06/22/19 1433     (approximate)  I have reviewed the triage vital signs and the nursing notes.  History  Chief Complaint Fatigue    HPI Bonnie SOUTHWELL is a 79 y.o. female with history of HLD, diastolic HF, obesity who presents to the emergency department for an episode of chest pain.  Patient is concerned she might of had a heart attack.  Patient states last Friday, 4/30, she was awoken from sleep with chest pain and diaphoresis.  She is unable to describe the pain any further, describes it as "painful" and severe.  Pain lasted several minutes. No radiation. Her husband gave her a nitro tablet, which helped relieve her symptoms.  Denies any associated nausea or shortness of breath with the episode, however since then she reports feeling generally fatigued, tired, and short of breath.  No further episodes of chest pain. Also reports general tingling type sensation in her bilateral lower legs.   Past Medical Hx Past Medical History:  Diagnosis Date  . Anxiety   . Arthritis   . Cancer (HCC)    UTERINE  . Depression   . Dyspnea   . Heart murmur   . HOH (hard of hearing)   . Hyperlipidemia   . Hypothyroidism     Problem List Patient Active Problem List   Diagnosis Date Noted  . Arthritis 07/23/2016  . Morbid obesity due to excess calories ([place]) 07/21/2015  . Weakness 07/21/2015  . Heartburn 07/20/2013  . Back pain 07/20/2013  . SOB (shortness of breath) 08/29/2010  . Obesity 08/29/2010  . Mixed hyperlipidemia 08/29/2010    Past Surgical Hx Past Surgical History:  Procedure Laterality Date  . APPENDECTOMY    . CATARACT EXTRACTION W/PHACO Left 09/17/2017   Procedure: CATARACT EXTRACTION PHACO AND INTRAOCULAR LENS PLACEMENT (IOC);  Surgeon: Birder Robson, MD;  Location: [facility] ORS;  Service: Ophthalmology;   Laterality: Left;  [place] 00:48 AP% 13.0 CDE 6.26 Fluid pack lot # [id number]  . CATARACT EXTRACTION W/PHACO Right 10/15/2017   Procedure: CATARACT EXTRACTION PHACO AND INTRAOCULAR LENS PLACEMENT (IOC);  Surgeon: Birder Robson, MD;  Location: [facility] ORS;  Service: Ophthalmology;  Laterality: Right;  [place]  00:44 AP% 16.1 CDE 7.23 Fluid pack lot # [id number]  . CHOLECYSTECTOMY      Medications Prior to Admission medications   Medication Sig Start Date End Date Taking? Authorizing Provider  acetaminophen (TYLENOL) 500 MG tablet Take 500 mg by mouth every 6 (six) hours as needed for moderate pain or headache.    [provider]  aspirin 81 MG tablet Take 81 mg by mouth daily.    [provider]  escitalopram (LEXAPRO) 10 MG tablet Take 15 mg by mouth daily.     [provider]  ezetimibe (ZETIA) 10 MG tablet Take 1 tablet by mouth once daily 06/02/19   Minna Merritts, MD  furosemide (LASIX) 20 MG tablet Take 1 tablet (20 mg total) by mouth 2 (two) times daily. 05/08/18   Minna Merritts, MD  levothyroxine (SYNTHROID, LEVOTHROID) 50 MCG tablet Take 50 mcg by mouth daily before breakfast.    [provider]  potassium chloride (MICRO-K) 10 MEQ CR capsule Take 10 mEq by mouth 2 (two) times daily.  07/21/15   Minna Merritts, MD  rosuvastatin (CRESTOR) 5 MG tablet Take 1 tablet (5 mg total) by mouth daily. 05/06/19 08/04/19  Gollan,  Kathlene November, MD    Allergies Statins  Family Hx Family History  Problem Relation Age of Onset  . Heart attack Father   . Emphysema Mother   . Heart disease Mother     Social Hx Social History   Tobacco Use  . Smoking status: Never Smoker  . Smokeless tobacco: Never Used  Substance Use Topics  . Alcohol use: Not Currently  . Drug use: No     Review of Systems  Constitutional: Negative for fever. Negative for chills.  Positive for fatigue. Eyes: Negative for visual changes. ENT: Negative for sore throat. Cardiovascular:  Positive for chest pain. Respiratory: Negative for shortness of breath. Gastrointestinal: Negative for nausea. Negative for vomiting.  Genitourinary: Negative for dysuria. Musculoskeletal: Negative for leg swelling. Skin: Negative for rash. Neurological: Negative for headaches.   Physical Exam  Vital Signs: ED Triage Vitals  Enc Vitals Group     BP 06/22/19 1413 (!) 125/39     Pulse Rate 06/22/19 1413 66     Resp 06/22/19 1413 16     Temp 06/22/19 1413 98.4 F (36.9 C)     Temp Source 06/22/19 1413 Oral     SpO2 06/22/19 1413 98 %     Weight 06/22/19 1414 175 lb (79.4 kg)     Height 06/22/19 1414 5\' 2"  (1.575 m)     Head Circumference --      Peak Flow --      Pain Score 06/22/19 1414 0     Pain Loc --      Pain Edu? --      Excl. in [place]? --     Constitutional: Alert and oriented. Well appearing. NAD.  Head: Normocephalic. Atraumatic. Eyes: Conjunctivae clear. Sclera anicteric. Pupils equal and symmetric. Nose: No masses or lesions. No congestion or rhinorrhea. Mouth/Throat: Wearing mask.  Neck: No stridor. Trachea midline.  Cardiovascular: Normal rate, regular rhythm. Extremities well perfused. Respiratory: Normal respiratory effort.  Lungs CTAB. Gastrointestinal: Soft. Non-distended. Non-tender.  Genitourinary: Deferred. Musculoskeletal: No lower extremity edema. No deformities. Neurologic:  Normal speech and language. No gross focal or lateralizing neurologic deficits are appreciated.  Skin: Skin is warm, dry and intact. No rash noted. Psychiatric: Mood and affect are appropriate for situation.  EKG  Personally reviewed and interpreted by myself.   Date: 06/22/19 Time: 1419 Rate: 70 Rhythm: sinus Axis: normal Intervals: WNL No STEMI    Radiology  Personally reviewed available imaging myself.   CXR - IMPRESSION:  No acute disease.  Aortic Atherosclerosis (ICD10-I70.0).    Procedures  Procedure(s) performed (including critical care):  Procedures    Initial Impression / Assessment and Plan / MDM / ED Course  79 y.o. female who presents to the ED for an episode of chest pain several days ago. No further episodes of pain since. Now feeling generally weak/fatigued.   Ddx: ACS, HF exacerbation, anemia, electrolyte abnormality, pulmonary infection, PE  Will plan for labs, EKG, imaging  Clinical Course as of Jun 21 2104  [facility] Jun 22, 2019  1733 Initial troponin negative. Will obtain delta. D-dimer elevated, will pursue CT PE. Updated patient on results thus far, she is in agreement with plan.   [SM]  1805 Repeat troponin negative.  Electrolytes without actual derangements.  No anemia.  Normal BNP.  CT negative for PE.  Incidentally found nodule - will refer to pulmonary nodule clinic. Pending UA, anticipate discharge given otherwise negative work up.   [SM]  1900 Urine negative for infection.  As such, given negative work-up, feel patient is stable for discharge with outpatient follow-up.  Advise follow-up with her cardiologist as well as a pulmonary nodule clinic, discussed these findings with the patient.  She voices understanding and is comfortable with the plan and discharge.  Given return precautions.   [SM]    Clinical Course User Index [SM] Lilia Pro., MD     _______________________________   As part of my medical decision making I have reviewed available labs, radiology tests, reviewed old records/performed chart review.    Final Clinical Impression(s) / ED Diagnosis  Final diagnoses:  Generalized weakness  Chest pain in adult  Pulmonary nodule       Note:  This document was prepared using Dragon voice recognition software and may include unintentional dictation errors.   Lilia Pro., MD 06/22/19 2106

## 2019-06-22 NOTE — ED Triage Notes (Addendum)
Pt to ED via POV. Pt states that she had a heart attack on 4/30. Pt states that she was not evaluated at that time but she knows that she had a heart attack because she was having severe pain. Pt states that she took 1 of her husbands nitro tablets and about 30 minutes later the pain resolved. Pt states that she had tingling in her legs when she had the chest pain but that has now resolved. Pt states that she is no longer having chest pain but she is fatigue. Pt is in NAD.

## 2019-06-24 ENCOUNTER — Ambulatory Visit (INDEPENDENT_AMBULATORY_CARE_PROVIDER_SITE_OTHER): Payer: PPO | Admitting: Family

## 2019-06-24 ENCOUNTER — Other Ambulatory Visit: Payer: Self-pay

## 2019-06-24 ENCOUNTER — Encounter: Payer: Self-pay | Admitting: Family

## 2019-06-24 VITALS — BP 128/60 | Ht 61.0 in | Wt 185.0 lb

## 2019-06-24 DIAGNOSIS — R0789 Other chest pain: Secondary | ICD-10-CM | POA: Diagnosis not present

## 2019-06-24 DIAGNOSIS — I5032 Chronic diastolic (congestive) heart failure: Secondary | ICD-10-CM

## 2019-06-24 DIAGNOSIS — E782 Mixed hyperlipidemia: Secondary | ICD-10-CM

## 2019-06-24 NOTE — Patient Instructions (Signed)
Medication Instructions:  Your physician recommends that you continue on your current medications as directed. Please refer to the Current Medication list given to you today.  *If you need a refill on your cardiac medications before your next appointment, please call your pharmacy*   Lab Work: None ordered  If you have labs (blood work) drawn today and your tests are completely normal, you will receive your results only by: Marland Kitchen MyChart Message (if you have MyChart) OR . A paper copy in the mail If you have any lab test that is abnormal or we need to change your treatment, we will call you to review the results.   Testing/Procedures: None ordered    Follow-Up: At [facility], you and your health needs are our priority.  As part of our continuing mission to provide you with exceptional heart care, we have created designated Provider Care Teams.  These Care Teams include your primary Cardiologist (physician) and Advanced Practice Providers (APPs -  Physician Assistants and Nurse Practitioners) who all work together to provide you with the care you need, when you need it.  We recommend signing up for the patient portal called "MyChart".  Sign up information is provided on this After Visit Summary.  MyChart is used to connect with patients for Virtual Visits (Telemedicine).  Patients are able to view lab/test results, encounter notes, upcoming appointments, etc.  Non-urgent messages can be sent to your provider as well.   To learn more about what you can do with MyChart, go to [email or web address].    Your next appointment:   3 month(s)  The format for your next appointment:   In Person  Provider:    You may see Ida Rogue, MD or one of the following Advanced Practice Providers on your designated Care Team:    Murray Hodgkins, NP  Christell Faith, PA-C  Marrianne Mood, PA-C

## 2019-06-24 NOTE — Progress Notes (Signed)
Office Visit    Patient Name: Bonnie Hancock Date of Encounter: 06/24/2019  Primary Care Provider:  Albina Billet, MD Primary Cardiologist:  Ida Rogue, MD Electrophysiologist:  None   Chief Complaint    Bonnie Hancock is a 79 y.o. female with a hx of chronic diastolic heart failure, obesity, hyperlipidemia, hypothyroidism, arthritis presents today for follow-up after ED visit for chest pain.  Past Medical History    Past Medical History:  Diagnosis Date  . Anxiety   . Arthritis   . Cancer (HCC)    UTERINE  . Depression   . Dyspnea   . Heart murmur   . HOH (hard of hearing)   . Hyperlipidemia   . Hypothyroidism    Past Surgical History:  Procedure Laterality Date  . APPENDECTOMY    . CATARACT EXTRACTION W/PHACO Left 09/17/2017   Procedure: CATARACT EXTRACTION PHACO AND INTRAOCULAR LENS PLACEMENT (IOC);  Surgeon: Birder Robson, MD;  Location: [facility] ORS;  Service: Ophthalmology;  Laterality: Left;  [place] 00:48 AP% 13.0 CDE 6.26 Fluid pack lot # [id number]  . CATARACT EXTRACTION W/PHACO Right 10/15/2017   Procedure: CATARACT EXTRACTION PHACO AND INTRAOCULAR LENS PLACEMENT (IOC);  Surgeon: Birder Robson, MD;  Location: [facility] ORS;  Service: Ophthalmology;  Laterality: Right;  [place]  00:44 AP% 16.1 CDE 7.23 Fluid pack lot # [id number]  . CHOLECYSTECTOMY      Allergies  Allergies  Allergen Reactions  . Statins Other (See Comments)    Muscle weakness    History of Present Illness    Bonnie Hancock is a 79 y.o. female with a hx of chronic diastolic heart failure, obesity, hyperlipidemia, hypothyroidism, arthritis.  She was last seen 04/27/2019 by Dr. Rockey Situ.  Echo 2012 with normal LV systolic function, normal heart valves, mildly elevated RV systolic pressures consistent with mild pulmonary hypertension.  This is been managed with Lasix.  At clinic visit 04/27/2019 with Dr. Rockey Situ reported feeling overall well.  Subsequent lab work showed markedly elevated BUN on Zetia.   She was recommended to retrial Crestor 5 mg daily.  She was unable to be reached by telephone.  Seen in the ED 06/22/2019 presenting with episode of chest pain.  Per her report last Friday, 06/19/2019 she was awoken from sleep with chest pain diaphoresis.  Lasted several minutes without radiation.  Relieved with 1 nitroglycerin.  Since that time she was noted to feel generally fatigued, tired short of breath.  EKG by my review NSR 72 bpm no acute ST/T wave changes.  Troponin negative x2.  D-dimer elevated, CT negative for PE but with incidentally noted nodule.  UA negative.  She was referred to the pulmonary nodule clinic.  Reports feeling well since discharge.  Reports no recurrent chest pain, pressure, tightness.  She tells me she feels she might have had a panic attack.  Has been recently more stressed with her husband starting dialysis about a month ago and adjusting to the schedule.  Tells me he is tolerating it well.  We discussed that her chest pain was atypical for angina as it occurred with rest.  She reports no dyspnea on exertion.  No edema, orthopnea, PND. Reports no palpitations.    EKGs/Labs/Other Studies Reviewed:   The following studies were reviewed today:  06/21/2019 CT angio chest PE IMPRESSION: 1. No evidence of pulmonary embolus. 2. Aortic Atherosclerosis (ICD10-I70.0). 3. 4 mm left upper lobe pulmonary nodule (image 45/series 6). No follow-up needed if patient is low-risk (and has no  known or suspected primary neoplasm). Non-contrast chest CT can be considered in 12 months if patient is high-risk. This recommendation follows the consensus statement: Guidelines for Management of Incidental Pulmonary Nodules Detected on CT Images: From the Fleischner Society 2017; Radiology 2017; 284:228-243.  Echo 2012 Left ventricle: The cavity size was normal. There was mild    concentric hypertrophy. Systolic function was normal. The    estimated ejection fraction was in the range of  55% to 65%. Wall    motion was normal; there were no regional wall motion    abnormalities. Doppler parameters are consistent with abnormal    left ventricular relaxation (grade 1 diastolic dysfunction).  - Left atrium: The atrium was normal in size.  - Right ventricle: Systolic function was normal.  - Pulmonary arteries: PA peak pressure: 85mm Hg (S). Elevated RVSP    consistent with mild pulmonary HTN.    EKG:  EKG is ordered today.  The ekg ordered today demonstrates NSR 64 bpm no acute ST/T wave changes.  Recent Labs: 06/22/2019: B Natriuretic Peptide 76.0; BUN 21; Creatinine, Ser 0.90; Hemoglobin 13.9; Platelets 218; Potassium 3.7; Sodium 138  Recent Lipid Panel    Component Value Date/Time   CHOL 261 (H) 04/27/2019 1229   TRIG 179 (H) 04/27/2019 1229   HDL 43 04/27/2019 1229   CHOLHDL 6.1 (H) 04/27/2019 1229   LDLCALC 184 (H) 04/27/2019 1229    Home Medications   No outpatient medications have been marked as taking for the 06/24/19 encounter (Appointment) with Loel Dubonnet, NP.    Review of Systems      Review of Systems  Constitution: Negative for chills, fever and malaise/fatigue.  Cardiovascular: Negative for chest pain, dyspnea on exertion, leg swelling, near-syncope, orthopnea, palpitations and syncope.  Respiratory: Negative for cough, shortness of breath and wheezing.   Gastrointestinal: Negative for nausea and vomiting.  Neurological: Negative for dizziness, light-headedness and weakness.   All other systems reviewed and are otherwise negative except as noted above.  Physical Exam    VS:  LMP  (LMP Unknown)  , BMI There is no height or weight on file to calculate BMI. GEN: Well nourished, overweigh, well developed, in no acute distress. HEENT: normal. Neck: Supple, no JVD, carotid bruits, or masses. Cardiac: RRR, no murmurs, rubs, or gallops. No clubbing, cyanosis, edema.  Radials/DP/PT 2+ and equal bilaterally.  Respiratory:  Respirations regular and  unlabored, clear to auscultation bilaterally. GI: Soft, nontender, nondistended. MS: No deformity or atrophy. Skin: Warm and dry, no rash. Neuro:  Strength and sensation are intact. Psych: Normal affect.  Accessory Clinical Findings    ECG personally reviewed by me today - NSR 64 bpm - no acute changes.  Assessment & Plan    1. Chronic diastolic heart failure - Euvolemic and well compensated on exam. DOE stable at baseline. Continue Lasix 20mg  BID.   2. Hyperlipidemia - Lipid panel 04/27/19 total cholesterol 271, triglycerides 179, LDL 184, HDL 43. Tells me her cholesterol was checked recently by Dr. Hall Busing and "much better". Will request labs from PCP. If LDL >100 may benefit from addition of Nexletol.   3. Chest pain - ED visit with chest pain occurring at rest. No acute findings. EKG today no acute changes. Symptoms atypical for angina as they occurred at rest. Anticipate anxiety was etiology. Discussed potential imaging via cardiac CT vs Lexiscan Myoview. She politely declines and prefers to monitor symptoms which is a reasonable plan.   4. Pulmonary nodule -CT chest 06/21/2019  with 4 mm left upper lobe pulmonary nodule with recommended for repeat study in 12 months if patient is high risk or no follow-up if low risk.  She was referred to the pulmonary nodule clinic. Overall low risk as no smoking history, reports no exposure to toxic chemicals.   5. Aortic atherosclerosis -incidental finding on CT.  Noted on CT back to 2013. Continue aspirin, statin, zetia.   Disposition: Follow up in 3 month(s) with Dr. Rockey Situ or APP.    Loel Dubonnet, NP 06/24/2019, 2:04 PM

## 2019-07-05 ENCOUNTER — Other Ambulatory Visit: Payer: Self-pay | Admitting: Cardiovascular Disease

## 2019-07-09 DIAGNOSIS — K023 Arrested dental caries: Secondary | ICD-10-CM | POA: Diagnosis not present

## 2019-07-13 ENCOUNTER — Other Ambulatory Visit: Payer: Self-pay | Admitting: Cardiovascular Disease

## 2019-07-17 ENCOUNTER — Other Ambulatory Visit: Payer: Self-pay | Admitting: Oncology

## 2019-07-17 DIAGNOSIS — R911 Solitary pulmonary nodule: Secondary | ICD-10-CM

## 2019-07-17 NOTE — Progress Notes (Signed)
  Pulmonary Nodule Clinic Telephone Note [place]   Received referral from the emergency room Dr. Joan Mayans.   HPI: Mrs. Pieroni is an 79 year old female with past medical history significant for HLD, diastolic heart failure, obesity who presented to the emergency room on 06/22/2019 for chest pain and fatigue. Work-up included lab work which revealed an elevated D-dimer. CT angio to rule out PE was negative for PE but found to new pulmonary nodules measuring 3 mm and 4 mm in size. Referral sent to pulmonary nodule clinic.  Review and Recommendations: I personally reviewed her CT angio from 06/23/2019 which showed a 4 mm left upper lobe pulmonary nodule and a 3 mm left lower lobe pulmonary nodule. A 33-month noncontrast chest CT is recommended in 12 months if patient is considered high risk.  Social History:  Unclear if patient is high risk given no social history on file. We will touch base with patient to evaluate prior to imaging.  High risk factors include: History of heavy smoking, exposure to asbestos, radium or uranium, personal family history of lung cancer, older age, sex (females greater than males), race (black and native [place] greater than weight), marginal speculation, upper lobe location, multiplicity (less than 5 nodules increases risk for malignancy) and emphysema and/or pulmonary fibrosis.   This recommendation follows the consensus statement: Guidelines for Management of Incidental Pulmonary Nodules Detected on CT Images: From the Fleischner Society 2017; Radiology 2017; 284:228-243.    I have placed order for CT scan without contrast to be completed approximately 12 months.  Disposition: Order placed for repeat CT chest. Will notify [place] in scheduling. [place] to call patient with appointment date and time. Return to pulmonary nodule clinic a few days after his repeat imaging to discuss results and plan moving forward.  Faythe Casa, NP 07/17/2019  12:02 PM

## 2019-07-30 ENCOUNTER — Telehealth: Payer: Self-pay | Admitting: *Deleted

## 2019-07-30 NOTE — Telephone Encounter (Signed)
Attempted to contact pt to review referral to the lung nodule clinic as well as scheduled appts. Pt did not answer and unable to leave message. Will try again at a later date.

## 2019-08-25 ENCOUNTER — Encounter: Payer: Self-pay | Admitting: Emergency Medicine

## 2019-08-25 ENCOUNTER — Other Ambulatory Visit: Payer: Self-pay

## 2019-08-25 ENCOUNTER — Emergency Department: Payer: PPO

## 2019-08-25 DIAGNOSIS — Z888 Allergy status to other drugs, medicaments and biological substances status: Secondary | ICD-10-CM | POA: Insufficient documentation

## 2019-08-25 DIAGNOSIS — Z79899 Other long term (current) drug therapy: Secondary | ICD-10-CM | POA: Insufficient documentation

## 2019-08-25 DIAGNOSIS — I214 Non-ST elevation (NSTEMI) myocardial infarction: Principal | ICD-10-CM | POA: Insufficient documentation

## 2019-08-25 DIAGNOSIS — Z79890 Hormone replacement therapy (postmenopausal): Secondary | ICD-10-CM | POA: Diagnosis not present

## 2019-08-25 DIAGNOSIS — Z20822 Contact with and (suspected) exposure to covid-19: Secondary | ICD-10-CM | POA: Insufficient documentation

## 2019-08-25 DIAGNOSIS — H9190 Unspecified hearing loss, unspecified ear: Secondary | ICD-10-CM | POA: Insufficient documentation

## 2019-08-25 DIAGNOSIS — F419 Anxiety disorder, unspecified: Secondary | ICD-10-CM | POA: Insufficient documentation

## 2019-08-25 DIAGNOSIS — E119 Type 2 diabetes mellitus without complications: Secondary | ICD-10-CM | POA: Diagnosis not present

## 2019-08-25 DIAGNOSIS — I25110 Atherosclerotic heart disease of native coronary artery with unstable angina pectoris: Secondary | ICD-10-CM | POA: Insufficient documentation

## 2019-08-25 DIAGNOSIS — E039 Hypothyroidism, unspecified: Secondary | ICD-10-CM | POA: Diagnosis not present

## 2019-08-25 DIAGNOSIS — I110 Hypertensive heart disease with heart failure: Secondary | ICD-10-CM | POA: Diagnosis not present

## 2019-08-25 DIAGNOSIS — R079 Chest pain, unspecified: Secondary | ICD-10-CM | POA: Diagnosis not present

## 2019-08-25 DIAGNOSIS — I700 Atherosclerosis of aorta: Secondary | ICD-10-CM | POA: Diagnosis not present

## 2019-08-25 DIAGNOSIS — Z6832 Body mass index (BMI) 32.0-32.9, adult: Secondary | ICD-10-CM | POA: Insufficient documentation

## 2019-08-25 DIAGNOSIS — M1990 Unspecified osteoarthritis, unspecified site: Secondary | ICD-10-CM | POA: Diagnosis not present

## 2019-08-25 DIAGNOSIS — Z7982 Long term (current) use of aspirin: Secondary | ICD-10-CM | POA: Diagnosis not present

## 2019-08-25 DIAGNOSIS — Z8249 Family history of ischemic heart disease and other diseases of the circulatory system: Secondary | ICD-10-CM | POA: Diagnosis not present

## 2019-08-25 DIAGNOSIS — F329 Major depressive disorder, single episode, unspecified: Secondary | ICD-10-CM | POA: Insufficient documentation

## 2019-08-25 DIAGNOSIS — I200 Unstable angina: Secondary | ICD-10-CM | POA: Diagnosis not present

## 2019-08-25 DIAGNOSIS — R0602 Shortness of breath: Secondary | ICD-10-CM | POA: Diagnosis not present

## 2019-08-25 DIAGNOSIS — R911 Solitary pulmonary nodule: Secondary | ICD-10-CM | POA: Insufficient documentation

## 2019-08-25 DIAGNOSIS — R001 Bradycardia, unspecified: Secondary | ICD-10-CM | POA: Diagnosis not present

## 2019-08-25 DIAGNOSIS — I5032 Chronic diastolic (congestive) heart failure: Secondary | ICD-10-CM | POA: Diagnosis not present

## 2019-08-25 DIAGNOSIS — E785 Hyperlipidemia, unspecified: Secondary | ICD-10-CM | POA: Insufficient documentation

## 2019-08-25 LAB — BASIC METABOLIC PANEL
Anion gap: 15 (ref 5–15)
BUN: 25 mg/dL — ABNORMAL HIGH (ref 8–23)
CO2: 24 mmol/L (ref 22–32)
Calcium: 9.9 mg/dL (ref 8.9–10.3)
Chloride: 103 mmol/L (ref 98–111)
Creatinine, Ser: 1.06 mg/dL — ABNORMAL HIGH (ref 0.44–1.00)
GFR calc Af Amer: 57 mL/min — ABNORMAL LOW (ref >60)
GFR calc non Af Amer: 50 mL/min — ABNORMAL LOW (ref >60)
Glucose, Bld: 111 mg/dL — ABNORMAL HIGH (ref 70–99)
Potassium: 4.1 mmol/L (ref 3.5–5.1)
Sodium: 142 mmol/L (ref 135–145)

## 2019-08-25 LAB — CBC
HCT: 42.5 % (ref 36.0–46.0)
Hemoglobin: 14 g/dL (ref 12.0–15.0)
MCH: 30.6 pg (ref 26.0–34.0)
MCHC: 32.9 g/dL (ref 30.0–36.0)
MCV: 93 fL (ref 80.0–100.0)
Platelets: 227 10*3/uL (ref 150–400)
RBC: 4.57 MIL/uL (ref 3.87–5.11)
RDW: 13.3 % (ref 11.5–15.5)
WBC: 9.1 10*3/uL (ref 4.0–10.5)
nRBC: 0 % (ref 0.0–0.2)

## 2019-08-25 LAB — TROPONIN I (HIGH SENSITIVITY): Troponin I (High Sensitivity): 70 ng/L — ABNORMAL HIGH (ref <18)

## 2019-08-25 MED ORDER — SODIUM CHLORIDE 0.9% FLUSH: 3.0000 mL | Freq: Once | INTRAVENOUS | Status: DC

## 2019-08-25 NOTE — ED Triage Notes (Signed)
Pt to ED c/o left chest pain radiating through to back and left arm approx 1hr PTA, states was riding in the car when it happened, denies injury or n/v/d, states feeling SOB.  Presents A&Ox4, tachypnic, pale and dry skin.

## 2019-08-26 ENCOUNTER — Other Ambulatory Visit: Payer: Self-pay

## 2019-08-26 ENCOUNTER — Observation Stay (HOSPITAL_BASED_OUTPATIENT_CLINIC_OR_DEPARTMENT_OTHER)
Admit: 2019-08-26 | Discharge: 2019-08-26 | Disposition: A | Discharge status: Home / Self Care | Payer: PPO | Attending: Cardiology | Admitting: Cardiology

## 2019-08-26 ENCOUNTER — Observation Stay
Admission: EM | Admit: 2019-08-26 | Discharge: 2019-08-27 | Disposition: A | Discharge status: Other Acute Inpatient Hospital | Payer: PPO | Attending: Internal Medicine | Admitting: Internal Medicine

## 2019-08-26 DIAGNOSIS — I214 Non-ST elevation (NSTEMI) myocardial infarction: Secondary | ICD-10-CM | POA: Diagnosis not present

## 2019-08-26 DIAGNOSIS — R079 Chest pain, unspecified: Secondary | ICD-10-CM | POA: Diagnosis not present

## 2019-08-26 DIAGNOSIS — F419 Anxiety disorder, unspecified: Secondary | ICD-10-CM

## 2019-08-26 DIAGNOSIS — I200 Unstable angina: Secondary | ICD-10-CM

## 2019-08-26 DIAGNOSIS — E7800 Pure hypercholesterolemia, unspecified: Secondary | ICD-10-CM

## 2019-08-26 DIAGNOSIS — E039 Hypothyroidism, unspecified: Secondary | ICD-10-CM

## 2019-08-26 LAB — CBC
HCT: 39.3 % (ref 36.0–46.0)
Hemoglobin: 13.6 g/dL (ref 12.0–15.0)
MCH: 31.6 pg (ref 26.0–34.0)
MCHC: 34.6 g/dL (ref 30.0–36.0)
MCV: 91.2 fL (ref 80.0–100.0)
Platelets: 203 10*3/uL (ref 150–400)
RBC: 4.31 MIL/uL (ref 3.87–5.11)
RDW: 13.6 % (ref 11.5–15.5)
WBC: 8.7 10*3/uL (ref 4.0–10.5)
nRBC: 0 % (ref 0.0–0.2)

## 2019-08-26 LAB — TROPONIN I (HIGH SENSITIVITY)
Troponin I (High Sensitivity): 1155 ng/L (ref <18)
Troponin I (High Sensitivity): 6211 ng/L (ref <18)
Troponin I (High Sensitivity): 8960 ng/L (ref <18)

## 2019-08-26 LAB — HEPARIN LEVEL (UNFRACTIONATED)
Heparin Unfractionated: 0.99 IU/mL — ABNORMAL HIGH (ref 0.30–0.70)
Heparin Unfractionated: 0.68 IU/mL (ref 0.30–0.70)

## 2019-08-26 LAB — APTT: aPTT: 32 seconds (ref 24–36)

## 2019-08-26 LAB — PROTIME-INR
INR: 1 (ref 0.8–1.2)
Prothrombin Time: 12.9 seconds (ref 11.4–15.2)

## 2019-08-26 LAB — SARS CORONAVIRUS 2 BY RT PCR (HOSPITAL ORDER, PERFORMED IN ~~LOC~~ HOSPITAL LAB): SARS Coronavirus 2: NEGATIVE

## 2019-08-26 LAB — ECHOCARDIOGRAM COMPLETE

## 2019-08-26 MED ORDER — ASPIRIN 81 MG PO CHEW
324.0000 mg | CHEWABLE_TABLET | Freq: Once | ORAL | Status: AC
  Administered 2019-08-26: 324 mg via ORAL
  Filled 2019-08-26: qty 4

## 2019-08-26 MED ORDER — NITROGLYCERIN 2 % TD OINT
1.0000 [in_us] | TOPICAL_OINTMENT | Freq: Once | TRANSDERMAL | Status: AC
  Administered 2019-08-26: 1 [in_us] via TOPICAL
  Filled 2019-08-26: qty 1

## 2019-08-26 MED ORDER — ALPRAZOLAM 0.25 MG PO TABS: 0.2500 mg | ORAL_TABLET | Freq: Two times a day (BID) | ORAL | Status: DC | PRN

## 2019-08-26 MED ORDER — ONDANSETRON HCL 4 MG/2ML IJ SOLN: 4.0000 mg | Freq: Four times a day (QID) | INTRAMUSCULAR | Status: DC | PRN

## 2019-08-26 MED ORDER — HEPARIN (PORCINE) 25000 UT/250ML-% IV SOLN
1000.0000 [IU]/h | INTRAVENOUS | Status: DC
  Administered 2019-08-26: 950 [IU]/h via INTRAVENOUS
  Filled 2019-08-26 (×2): qty 250

## 2019-08-26 MED ORDER — HEPARIN SODIUM (PORCINE) 5000 UNIT/ML IJ SOLN
4000.0000 [IU] | Freq: Once | INTRAMUSCULAR | Status: AC
  Administered 2019-08-26: 4000 [IU] via INTRAVENOUS
  Filled 2019-08-26: qty 1

## 2019-08-26 MED ORDER — METOPROLOL TARTRATE 25 MG PO TABS
12.5000 mg | ORAL_TABLET | Freq: Two times a day (BID) | ORAL | Status: DC
  Administered 2019-08-26 – 2019-08-27 (×3): 12.5 mg via ORAL
  Filled 2019-08-26 (×3): qty 1

## 2019-08-26 MED ORDER — ACETAMINOPHEN 325 MG PO TABS: 650.0000 mg | ORAL_TABLET | ORAL | Status: DC | PRN

## 2019-08-26 MED ORDER — ASPIRIN EC 81 MG PO TBEC: 81.0000 mg | DELAYED_RELEASE_TABLET | Freq: Every day | ORAL | Status: DC

## 2019-08-26 MED ORDER — NITROGLYCERIN 0.4 MG SL SUBL: 0.4000 mg | SUBLINGUAL_TABLET | SUBLINGUAL | Status: DC | PRN

## 2019-08-26 NOTE — ED Notes (Signed)
meds given.  Family with pt   Sinus brady on monitor.

## 2019-08-26 NOTE — ED Notes (Signed)
Pt given lunch tray at this time

## 2019-08-26 NOTE — Consult Note (Addendum)
Cardiology Consultation:   Patient ID: Bonnie Hancock MRN: [id number]; DOB: [birth year]  Admit date: 08/26/2019 Date of Consult: 08/26/2019  Primary Care Provider: Albina Billet, MD Primary Cardiologist: Ida Rogue, MD  Primary Electrophysiologist:  None    Patient Profile:   Bonnie Hancock is a 79 y.o. female with a hx of chronic diastolic heart failure, obesity, HLD, hypothyroidism, and arthritis who is being seen today for the evaluation of NSTEMI at the request of Dr. Neysa Bonito.  History of Present Illness:   Ms. Wisdom is an 79 year old female with history of chronic diastolic heart failure, obesity, hyperlipidemia, hypothyroidism, and arthritis.  She was last seen in clinic 06/24/2019.  Echo 2012 showed normal LVSF and mildly elevated RVSP consistent with mild pulmonary hypertension.  Since that time, this has been managed with Lasix.   She was seen in the ED 06/22/2019 with an episode of chest pain.  Per her report, she awoke 06/19/2019 with chest pain and diaphoresis.  Her chest discomfort lasted several minutes without radiation.  CP relieved with 1 nitro.  After that time, she was fatigued and experience shortness of breath/DOE.  ED work-up was without significant findings.  EKG was without ST/T changes.  Troponin negative.  D-dimer was elevated but CTA negative for PE.  Of note, on her CT, and incidental nodule was noted.  She was referred to the pulmonary nodule clinic.  She was seen in clinic 06/24/2019.  In clinic, the patient reported feeling well since discharge and without recurrent chest pain. No DOE. She felt as if she might have had a panic attack.  She reported feeling more stressed with her husband starting dialysis a month prior. CP was felt to be atypical as it occurred at rest.   On 08/25/2019, she presented to the [facility] emergency department with report of left chest pain or tightness, moderate in intensity, and that radiated to her back and left arm and had started  approximately 1 hour prior to her presentation.  CP occurred at rest and while riding in the car from the [facility] and after picking up her husband after he was discharged from a recent hospitalization.  She denied any associated symptoms other than shortness of breath.  No racing HR, palpitations, n/v, presyncope, or syncope. No diaphoresis. Per review of nursing notes, she arrived tachypneic and pale with dry skin.  In the ED, initial vitals were BP 136/30, HR 57 bpm, RR 26, 97.6 F, 100% on room air.  Labs significant for AKI and elevated troponin with creatinine 1.06, BUN 25, high-sensitivity troponin 70  1,155, EKG NSR without acute changes. She received ASA and was continued on a BB and started on a nitro drip and heparin. Echo ordered and cardiology consulted.  Heart Pathway Score:     Past Medical History:  Diagnosis Date  . Anxiety   . Arthritis   . Cancer (HCC)    UTERINE  . Depression   . Dyspnea   . Heart murmur   . HOH (hard of hearing)   . Hyperlipidemia   . Hypothyroidism     Past Surgical History:  Procedure Laterality Date  . APPENDECTOMY    . CATARACT EXTRACTION W/PHACO Left 09/17/2017   Procedure: CATARACT EXTRACTION PHACO AND INTRAOCULAR LENS PLACEMENT (IOC);  Surgeon: Birder Robson, MD;  Location: [facility] ORS;  Service: Ophthalmology;  Laterality: Left;  [place] 00:48 AP% 13.0 CDE 6.26 Fluid pack lot # [id number]  . CATARACT EXTRACTION W/PHACO Right 10/15/2017   Procedure: CATARACT  EXTRACTION PHACO AND INTRAOCULAR LENS PLACEMENT (IOC);  Surgeon: Birder Robson, MD;  Location: [facility] ORS;  Service: Ophthalmology;  Laterality: Right;  [place]  00:44 AP% 16.1 CDE 7.23 Fluid pack lot # [id number]  . CHOLECYSTECTOMY       Home Medications:  Prior to Admission medications   Medication Sig Start Date End Date Taking? Authorizing Provider  acetaminophen (TYLENOL) 500 MG tablet Take 500 mg by mouth every 6 (six) hours as needed for moderate pain or headache.    [provider]  aspirin 81 MG tablet Take 81 mg by mouth daily.    [provider]  escitalopram (LEXAPRO) 10 MG tablet Take 15 mg by mouth daily.     [provider]  ezetimibe (ZETIA) 10 MG tablet Take 1 tablet by mouth once daily 06/02/19   Minna Merritts, MD  furosemide (LASIX) 20 MG tablet Take 1 tablet by mouth twice daily 07/06/19   Minna Merritts, MD  levothyroxine (SYNTHROID, LEVOTHROID) 50 MCG tablet Take 50 mcg by mouth daily before breakfast.    [provider]  potassium chloride (MICRO-K) 10 MEQ CR capsule Take 10 mEq by mouth 2 (two) times daily.  07/21/15   Minna Merritts, MD  rosuvastatin (CRESTOR) 5 MG tablet Take 1 tablet (5 mg total) by mouth daily. 05/06/19 08/04/19  Minna Merritts, MD    Inpatient Medications: Scheduled Meds: . [START ON 08/27/2019] aspirin EC  81 mg Oral Daily  . metoprolol tartrate  12.5 mg Oral BID  . sodium chloride flush  3 mL Intravenous Once   Continuous Infusions: . heparin 950 Units/hr (08/26/19 0116)   PRN Meds: acetaminophen, ALPRAZolam, nitroGLYCERIN, ondansetron (ZOFRAN) IV  Allergies:    Allergies  Allergen Reactions  . Statins Other (See Comments)    Muscle weakness    Social History:   Social History   Socioeconomic History  . Marital status: Married    Spouse name: Not on file  . Number of children: 4  . Years of education: Not on file  . Highest education level: Not on file  Occupational History  . Occupation: retired  Tobacco Use  . Smoking status: Never Smoker  . Smokeless tobacco: Never Used  Vaping Use  . Vaping Use: Never used  Substance and Sexual Activity  . Alcohol use: Not Currently  . Drug use: No  . Sexual activity: Not on file  Other Topics Concern  . Not on file  Social History Narrative  . Not on file   Social Determinants of Health   Financial Resource Strain:   . Difficulty of Paying Living Expenses:   Food Insecurity:   . Worried About Charity fundraiser  in the Last Year:   . Arboriculturist in the Last Year:   Transportation Needs:   . Film/video editor (Medical):   Marland Kitchen Lack of Transportation (Non-Medical):   Physical Activity:   . Days of Exercise per Week:   . Minutes of Exercise per Session:   Stress:   . Feeling of Stress :   Social Connections:   . Frequency of Communication with Friends and Family:   . Frequency of Social Gatherings with Friends and Family:   . Attends Religious Services:   . Active Member of Clubs or Organizations:   . Attends Archivist Meetings:   Marland Kitchen Marital Status:   Intimate Partner Violence:   . Fear of Current or Ex-Partner:   . Emotionally Abused:   .  Physically Abused:   . Sexually Abused:     Family History:    Family History  Problem Relation Age of Onset  . Heart attack Father   . Emphysema Mother   . Heart disease Mother      ROS:  Please see the history of present illness.  Review of Systems  Constitutional: Positive for malaise/fatigue. Negative for diaphoresis.  Respiratory: Positive for shortness of breath. Negative for cough and hemoptysis.   Cardiovascular: Positive for chest pain. Negative for palpitations, orthopnea, leg swelling and PND.  Gastrointestinal: Negative for blood in stool, melena, nausea and vomiting.  Genitourinary: Negative for hematuria.  Musculoskeletal: Negative for falls.  Neurological: Negative for dizziness and loss of consciousness.  All other systems reviewed and are negative.   All other ROS reviewed and negative.     Physical Exam/Data:   Vitals:   08/26/19 0231 08/26/19 0232 08/26/19 0420 08/26/19 0707  BP:   (!) 122/55 (!) 100/48  Pulse:    (!) 50  Resp: 15 (!) 21 20 17   Temp:      TempSrc:      SpO2:   99%   Weight:      Height:       No intake or output data in the 24 hours ending 08/26/19 1018 Last 3 Weights 08/25/2019 06/24/2019 06/22/2019  Weight (lbs) 180 lb 185 lb 175 lb  Weight (kg) 81.647 kg 83.915 kg 79.379 kg       Body mass index is 32.92 kg/m.  General:  Well nourished, well developed, in no acute distress HEENT: normal Neck: no JVD Vascular: No carotid bruits; radial pulses 2+ bilaterally Cardiac:  normal S1, S2; RRR; no murmur  Lungs:  clear to auscultation bilaterally, no wheezing, rhonchi or rales  Abd: soft, nontender, no hepatomegaly  Ext: no edema Musculoskeletal:  No deformities, BUE and BLE strength normal and equal Skin: warm and dry  Neuro:  No focal abnormalities noted Psych:  Normal affect   EKG:  The EKG was personally reviewed and demonstrates:  NSR without acute ST/T changes Telemetry:  Telemetry was personally reviewed and demonstrates:  NSR  Relevant CV Studies:  Echo pending for 08/26/19  06/21/2019 CT angio chest PE IMPRESSION: 1. No evidence of pulmonary embolus. 2. Aortic Atherosclerosis (ICD10-I70.0). 3. 4 mm left upper lobe pulmonary nodule (image 45/series 6). No follow-up needed if patient is low-risk (and has no known or suspected primary neoplasm). Non-contrast chest CT can be considered in 12 months if patient is high-risk. This recommendation follows the consensus statement: Guidelines for Management of Incidental Pulmonary Nodules Detected on CT Images: From the Fleischner Society 2017; Radiology 2017; 284:228-243.  Echo 2012 Left ventricle: The cavity size was normal. There was mild  concentric hypertrophy. Systolic function was normal. The  estimated ejection fraction was in the range of 55% to 65%. Wall  motion was normal; there were no regional wall motion  abnormalities. Doppler parameters are consistent with abnormal  left ventricular relaxation (grade 1 diastolic dysfunction).  - Left atrium: The atrium was normal in size.  - Right ventricle: Systolic function was normal.  - Pulmonary arteries: PA peak pressure: 70mm Hg (S). Elevated RVSP  consistent with mild pulmonary HTN.    Laboratory Data:  High Sensitivity Troponin:    Recent Labs  Lab 08/25/19 2122 08/25/19 2333 08/26/19 0900  TROPONINIHS 70* 1,155* 8,960*     Cardiac EnzymesNo results for input(s): TROPONINI in the last 168 hours. No results for input(s): TROPIPOC  in the last 168 hours.  Chemistry Recent Labs  Lab 08/25/19 2122  NA 142  K 4.1  CL 103  CO2 24  GLUCOSE 111*  BUN 25*  CREATININE 1.06*  CALCIUM 9.9  GFRNONAA 50*  GFRAA 57*  ANIONGAP 15    No results for input(s): PROT, ALBUMIN, AST, ALT, ALKPHOS, BILITOT in the last 168 hours. Hematology Recent Labs  Lab 08/25/19 2122  WBC 9.1  RBC 4.57  HGB 14.0  HCT 42.5  MCV 93.0  MCH 30.6  MCHC 32.9  RDW 13.3  PLT 227   BNPNo results for input(s): BNP, PROBNP in the last 168 hours.  DDimer No results for input(s): DDIMER in the last 168 hours.   Radiology/Studies:  DG Chest 2 View  Result Date: 08/25/2019 CLINICAL DATA:  Left-sided chest pain. EXAM: CHEST - 2 VIEW COMPARISON:  Jun 22, 2019 FINDINGS: There is no evidence of acute infiltrate, pleural effusion or pneumothorax. The heart size and mediastinal contours are within normal limits. Multilevel degenerative changes seen throughout the thoracic spine. IMPRESSION: No active cardiopulmonary disease. Electronically Signed   By: Virgina Norfolk M.D.   On: 08/25/2019 21:58    Assessment and Plan:   NSTEMI --No current chest pain.  Experienced chest pain at rest and similar to CP from previous ED visit. Both atypical and typical features. See above in HPI. --HS Tn elevation consistent with NSTEMI. Continue to cycle until peaked and down trended.  EKG without acute ST/T changes. --Echo ordered and pending. Previous echo as above with EF nl.  --Consulted with rounding and diagnostic cardiologist regarding timing of cardiac catheterization. Plan for medical management today (7/7) with cardiac catheterization scheduled for tomorrow 08/27/2019. Will plan for [facility] due to recent CT and echo findings / pulmonary HTN to further  evaluate hemodynamics and heart pressures as well. Risks and benefits of cardiac catheterization have been discussed with the patient.  These include bleeding, infection, kidney damage, stroke, heart attack, death.  The patient understands these risks and is willing to proceed. --Continue medical management with ASA, BB, PRN nitro for CP, statin, and Zetia. Risk factor modification recommended with LDL control as below. --NPO after midnight in preparation for [facility].  --Further recommendations to be provided after echo and catheterization results.  HFpEF --DOE stable at baseline with SOB improved as above.  Updated echo pending.  Previous echo as above with normal EF.  Euvolemic and well compensated on exam.  Continue medical management.  HLD --As above, lipid panel 04/27/2019 with total cholesterol 271, triglycerides 179, LDL 184, and HDL 43.  Per primary cardiologist, retrial with Crestor.  If LDL still elevated in 6-8 weeks from start, and if unable to escalate statin to control LDL, consider referral to lipid clinic and/or our pharmacy for additional recommendations, including [id number]. Will need to reassess at RTC.  Pulmonary nodule --As noted in clinic, CT chest 5/2 with 4 mm left upper lobe nodule.  She has been referred to the pulmonary nodule clinic.  No previous history of smoking or exposure to toxic chemicals.  Aortic atherosclerosis --As noted on previous ED.  Continue ASA, statin, Zetia. Control of LDL as above.    For questions or updates, please contact [place] Please consult [email or web address] for contact info under     Signed, Arvil Chaco, PA-C  08/26/2019 10:18 AM

## 2019-08-26 NOTE — Progress Notes (Signed)
ANTICOAGULATION CONSULT NOTE - Initial Consult  Pharmacy Consult for Heparin Indication: chest pain/ACS  Allergies  Allergen Reactions  . Statins Other (See Comments)    Muscle weakness   Patient Measurements: Height: 5\' 2"  (157.5 cm) Weight: 81.6 kg (180 lb) IBW/kg (Calculated) : 50.1 HEPARIN DW (KG): 68.3  Vital Signs: Temp: 98.2 F (36.8 C) (07/07 1934) Temp Source: Oral (07/07 1934) BP: 104/38 (07/07 1934) Pulse Rate: 58 (07/07 1934)  Labs: Recent Labs    08/25/19 2122 08/25/19 2122 08/25/19 2333 08/26/19 0109 08/26/19 0900 08/26/19 1202 08/26/19 1654  HGB 14.0  --   --   --   --   --  13.6  HCT 42.5  --   --   --   --   --  39.3  PLT 227  --   --   --   --   --  203  APTT  --   --   --  32  --   --   --   LABPROT  --   --   --  12.9  --   --   --   INR  --   --   --  1.0  --   --   --   HEPARINUNFRC  --   --   --   --  0.99*  --  0.68  CREATININE 1.06*  --   --   --   --   --   --   TROPONINIHS 70*   < > 1,155*  --  8,960* 6,211*  --    < > = values in this interval not displayed.    Estimated Creatinine Clearance: 41.9 mL/min (A) (by C-G formula based on SCr of 1.06 mg/dL (H)).   Medical History: Past Medical History:  Diagnosis Date  . Anxiety   . Arthritis   . Cancer (HCC)    UTERINE  . Depression   . Dyspnea   . Heart murmur   . HOH (hard of hearing)   . Hyperlipidemia   . Hypothyroidism     Assessment: Pharmacy consulted for heparin infusion dosing and monitoring for 79 yo female for ACS/STEMI.  No anticoagulants PTA.  7/7 AM: Heparin infusion started @ 950 units/hr  Goal of Therapy:  Heparin level 0.3-0.7 units/ml Monitor platelets by anticoagulation protocol: Yes   Plan:  7/7 @ 0900 HL: 0.99. Level is supratherapeutic.  Will reduce infusion rate to 800 units/hr Recheck HL in 6 hours.  CBC daily per protocol.   7/7:  HL @ 1654 = 0.68 Will continue pt on current rate and recheck HL 6 hrs on 7/8 @ 0100.   Orene Desanctis,  PharmD Clinical Pharmacist 08/26/2019 8:12 PM

## 2019-08-26 NOTE — Progress Notes (Signed)
ANTICOAGULATION CONSULT NOTE - Initial Consult  Pharmacy Consult for Heparin Indication: chest pain/ACS  Allergies  Allergen Reactions  . Statins Other (See Comments)    Muscle weakness   Patient Measurements: Height: 5\' 2"  (157.5 cm) Weight: 81.6 kg (180 lb) IBW/kg (Calculated) : 50.1 HEPARIN DW (KG): 68.3  Vital Signs: Temp: 97.6 F (36.4 C) (07/06 2119) Temp Source: Oral (07/06 2119) BP: 134/60 (07/07 0200) Pulse Rate: 57 (07/06 2119)  Labs: Recent Labs    08/25/19 2122 08/25/19 2333 08/26/19 0109  HGB 14.0  --   --   HCT 42.5  --   --   PLT 227  --   --   APTT  --   --  32  LABPROT  --   --  12.9  INR  --   --  1.0  CREATININE 1.06*  --   --   TROPONINIHS 70* 1,155*  --     Estimated Creatinine Clearance: 41.9 mL/min (A) (by C-G formula based on SCr of 1.06 mg/dL (H)).   Medical History: Past Medical History:  Diagnosis Date  . Anxiety   . Arthritis   . Cancer (HCC)    UTERINE  . Depression   . Dyspnea   . Heart murmur   . HOH (hard of hearing)   . Hyperlipidemia   . Hypothyroidism     Medications:  (Not in a hospital admission)   Assessment: Baseline labs normal.  Heparin initiated for ACS/STEMI.  No anticoagulants PTA.  Goal of Therapy:  Heparin level 0.3-0.7 units/ml Monitor platelets by anticoagulation protocol: Yes   Plan:  Heparin 4000 units bolus x 1 then infusion at 950 units/hr Check HL 8 hours after heparin started  Hart Robinsons A 08/26/2019,3:40 AM

## 2019-08-26 NOTE — ED Notes (Addendum)
Pt reports intermittent chest pain for 1 day.  Pt has sob also.  No n/v/d.  Pain in neck and left arm earlier today.  Pt alert  Speech clear  Family with pt

## 2019-08-26 NOTE — H&P (Signed)
History and Physical    Bonnie Hancock GBT:[id number] DOB: Jun 28, 1940 DOA: 08/26/2019  PCP: Albina Billet, MD   Patient coming from: Home  I have personally briefly reviewed patient's old medical records in [place]  Chief Complaint: Chest pain  HPI: Bonnie Hancock is a 79 y.o. female with medical history significant for anxiety and hypothyroidism who presents to the emergency room with left-sided chest pain radiating to the left arm that started while on her way to pick up her husband who was being discharged from the hospital in [place].  Describes the pain as a tightness of moderate to severe intensity, associated with anxiety, but no nausea, vomiting palpitations lightheadedness or shortness of breath or diaphoresis.  The pain started about an hour prior to arrival with no aggravating or alleviating factors. ED Course: On arrival she appeared anxious.  She was mildly tachypneic at 26 but with otherwise normal vitals.  Troponin 70>>1155.  EKG with no acute changes.  Chest x-ray unremarkable.  Patient given aspirin and started on a nitroglycerin infusion.  Hospitalist consulted for admission. Review of Systems: As per HPI otherwise all other systems on review of systems negative.    Past Medical History:  Diagnosis Date  . Anxiety   . Arthritis   . Cancer (HCC)    UTERINE  . Depression   . Dyspnea   . Heart murmur   . HOH (hard of hearing)   . Hyperlipidemia   . Hypothyroidism     Past Surgical History:  Procedure Laterality Date  . APPENDECTOMY    . CATARACT EXTRACTION W/PHACO Left 09/17/2017   Procedure: CATARACT EXTRACTION PHACO AND INTRAOCULAR LENS PLACEMENT (IOC);  Surgeon: Birder Robson, MD;  Location: [facility] ORS;  Service: Ophthalmology;  Laterality: Left;  [place] 00:48 AP% 13.0 CDE 6.26 Fluid pack lot # [id number]  . CATARACT EXTRACTION W/PHACO Right 10/15/2017   Procedure: CATARACT EXTRACTION PHACO AND INTRAOCULAR LENS PLACEMENT (IOC);  Surgeon: Birder Robson,  MD;  Location: [facility] ORS;  Service: Ophthalmology;  Laterality: Right;  [place]  00:44 AP% 16.1 CDE 7.23 Fluid pack lot # [id number]  . CHOLECYSTECTOMY       reports that she has never smoked. She has never used smokeless tobacco. She reports previous alcohol use. She reports that she does not use drugs.  Allergies  Allergen Reactions  . Statins Other (See Comments)    Muscle weakness    Family History  Problem Relation Age of Onset  . Heart attack Father   . Emphysema Mother   . Heart disease Mother       Prior to Admission medications   Medication Sig Start Date End Date Taking? Authorizing Provider  acetaminophen (TYLENOL) 500 MG tablet Take 500 mg by mouth every 6 (six) hours as needed for moderate pain or headache.    [provider]  aspirin 81 MG tablet Take 81 mg by mouth daily.    [provider]  escitalopram (LEXAPRO) 10 MG tablet Take 15 mg by mouth daily.     [provider]  ezetimibe (ZETIA) 10 MG tablet Take 1 tablet by mouth once daily 06/02/19   Minna Merritts, MD  furosemide (LASIX) 20 MG tablet Take 1 tablet by mouth twice daily 07/06/19   Minna Merritts, MD  levothyroxine (SYNTHROID, LEVOTHROID) 50 MCG tablet Take 50 mcg by mouth daily before breakfast.    [provider]  potassium chloride (MICRO-K) 10 MEQ CR capsule Take 10 mEq by mouth 2 (  two) times daily.  07/21/15   Minna Merritts, MD  rosuvastatin (CRESTOR) 5 MG tablet Take 1 tablet (5 mg total) by mouth daily. 05/06/19 08/04/19  Minna Merritts, MD    Physical Exam: Vitals:   08/26/19 0117 08/26/19 0118 08/26/19 0119 08/26/19 0121  BP:      Pulse:      Resp: 18 15 16 13   Temp:      TempSrc:      SpO2:      Weight:      Height:         Vitals:   08/26/19 0117 08/26/19 0118 08/26/19 0119 08/26/19 0121  BP:      Pulse:      Resp: 18 15 16 13   Temp:      TempSrc:      SpO2:      Weight:      Height:          Constitutional: Alert and oriented x 3  . Not in any apparent distress HEENT:      Head: Normocephalic and atraumatic.         Eyes: PERLA, EOMI, Conjunctivae are normal. Sclera is non-icteric.       Mouth/Throat: Mucous membranes are moist.       Neck: Supple with no signs of meningismus. Cardiovascular: Regular rate and rhythm. No murmurs, gallops, or rubs. 2+ symmetrical distal pulses are present . No JVD. No LE edema Respiratory: Respiratory effort normal .Lungs sounds clear bilaterally. No wheezes, crackles, or rhonchi.  Gastrointestinal: Soft, non tender, and non distended with positive bowel sounds. No rebound or guarding. Genitourinary: No CVA tenderness. Musculoskeletal: Nontender with normal range of motion in all extremities. No cyanosis, or erythema of extremities. Neurologic: Normal speech and language. Face is symmetric. Moving all extremities. No gross focal neurologic deficits . Skin: Skin is warm, dry.  No rash or ulcers Psychiatric: Mood and affect are normal Speech and behavior are normal   Labs on Admission: I have personally reviewed following labs and imaging studies  CBC: Recent Labs  Lab 08/25/19 2122  WBC 9.1  HGB 14.0  HCT 42.5  MCV 93.0  PLT 893   Basic Metabolic Panel: Recent Labs  Lab 08/25/19 2122  NA 142  K 4.1  CL 103  CO2 24  GLUCOSE 111*  BUN 25*  CREATININE 1.06*  CALCIUM 9.9   GFR: Estimated Creatinine Clearance: 41.9 mL/min (A) (by C-G formula based on SCr of 1.06 mg/dL (H)). Liver Function Tests: No results for input(s): AST, ALT, ALKPHOS, BILITOT, PROT, ALBUMIN in the last 168 hours. No results for input(s): LIPASE, AMYLASE in the last 168 hours. No results for input(s): AMMONIA in the last 168 hours. Coagulation Profile: No results for input(s): INR, PROTIME in the last 168 hours. Cardiac Enzymes: No results for input(s): CKTOTAL, CKMB, CKMBINDEX, TROPONINI in the last 168 hours. BNP (last 3 results) No results for input(s): PROBNP in the last 8760  hours. HbA1C: No results for input(s): HGBA1C in the last 72 hours. CBG: No results for input(s): GLUCAP in the last 168 hours. Lipid Profile: No results for input(s): CHOL, HDL, LDLCALC, TRIG, CHOLHDL, LDLDIRECT in the last 72 hours. Thyroid Function Tests: No results for input(s): TSH, T4TOTAL, FREET4, T3FREE, THYROIDAB in the last 72 hours. Anemia Panel: No results for input(s): VITAMINB12, FOLATE, FERRITIN, TIBC, IRON, RETICCTPCT in the last 72 hours. Urine analysis:    Component Value Date/Time   COLORURINE YELLOW (A) 06/22/2019 1819  APPEARANCEUR CLEAR (A) 06/22/2019 1819   LABSPEC 1.032 (H) 06/22/2019 1819   PHURINE 5.0 06/22/2019 1819   GLUCOSEU NEGATIVE 06/22/2019 1819   HGBUR SMALL (A) 06/22/2019 1819   BILIRUBINUR NEGATIVE 06/22/2019 1819   KETONESUR NEGATIVE 06/22/2019 1819   PROTEINUR NEGATIVE 06/22/2019 1819   NITRITE NEGATIVE 06/22/2019 1819   LEUKOCYTESUR NEGATIVE 06/22/2019 1819    Radiological Exams on Admission: DG Chest 2 View  Result Date: 08/25/2019 CLINICAL DATA:  Left-sided chest pain. EXAM: CHEST - 2 VIEW COMPARISON:  Jun 22, 2019 FINDINGS: There is no evidence of acute infiltrate, pleural effusion or pneumothorax. The heart size and mediastinal contours are within normal limits. Multilevel degenerative changes seen throughout the thoracic spine. IMPRESSION: No active cardiopulmonary disease. Electronically Signed   By: Virgina Norfolk M.D.   On: 08/25/2019 21:58    EKG: Independently reviewed. Interpretation : Normal sinus rhythm without acute ST-T wave changes Assessment/Plan 79 year old female with anxiety and hypothyroidism presenting with typical chest pain and troponin bump 70>>1155.     NSTEMI (non-ST elevated myocardial infarction) (HCC) -Continue aspirin and beta-blocker.  Patient intolerant to statins -Continue heparin infusion -Nitroglycerin sublingual as needed chest pain with morphine for breakthrough -Cardiology consult -We will keep  n.p.o. for any possible procedure    Anxiety -Continue home Lexapro.  Xanax as needed    Hypothyroidism -Continue home Synthroid    DVT prophylaxis: Full dose heparin Code Status: full code  Family Communication:  none  Disposition Plan: Back to previous home environment Consults called: Cardiology Status:obs    Athena Masse MD [facility]     08/26/2019, 1:32 AM

## 2019-08-26 NOTE — ED Notes (Signed)
Report off to shannon rn

## 2019-08-26 NOTE — ED Notes (Signed)
Pt transported to bedside toilet at this time with no difficulty. Pt tolerated well, will continue to monitor.

## 2019-08-26 NOTE — Progress Notes (Signed)
*  PRELIMINARY RESULTS* Echocardiogram 2D Echocardiogram has been performed.  Bonnie Hancock 08/26/2019, 10:16 AM

## 2019-08-26 NOTE — Progress Notes (Signed)
Bonnie Hancock is a 79 y.o. female with a history of anxiety and hypothyroidism who presented with chest pain and was admitted early this morning by Dr. Damita Dunnings for NSTEMI. She was placed on a heparin drip and given nitro SL and cardiology was consulted.   Today, patient was noted to have asymptomatic bradycardia and soft Bps. I discussed these findings with cardiology who felt the patient could eat today and would be able to go for cardiac cath tomorrow.   PE: General: Awake and alert, no acute distress  Heart: S1 and S2 auscultated, no murmurs  Lungs: Clear to auscultation bilaterally, no wheeze    A/P  1. NSTEMI a. Presenting EKG with ST depressions in V1-V2 with concerning ST changes in inferolateral leads on personal read, resolved on repeat EKG this AM b. Troponin peaked at 8,960 c. Continue medical therapy with plan for [facility] tomorrow d. Continue heparin drip e. Appreciate cardiology recommendations 2. Asymptomatic bradycardia a. Beta blocker with hold parameters b. Continue tele 3. HFpEF, compensated a. Updated echo pending 4. HLD 5. Pulmonary nodule 6. Anxiety 7. Hypothyroidism    Harold Hedge, DO [facility] Pager [phone]

## 2019-08-26 NOTE — ED Notes (Signed)
Dr. Neysa Bonito informed of pt's recent low BP readings

## 2019-08-26 NOTE — ED Provider Notes (Signed)
[facility] Emergency Department Provider Note   ____________________________________________   First MD Initiated Contact with Patient 08/26/19 [phone]     (approximate)  I have reviewed the triage vital signs and the nursing notes.   HISTORY  Chief Complaint Chest Pain    HPI Bonnie Hancock is a 79 y.o. female who presents to the ED with a chief complaint of chest pain.  Patient was driving back from the [place] up her husband who was hospitalized when she experience left-sided chest pressure radiating to her back and left arm approximately 1 hour prior to arrival.  Symptoms associated with shortness of breath.  Denies associated diaphoresis, palpitations, nausea/vomiting or dizziness.  Denies recent fever, cough, abdominal pain, diarrhea.  Denies recent travel or trauma.       Past Medical History:  Diagnosis Date  . Anxiety   . Arthritis   . Cancer (HCC)    UTERINE  . Depression   . Dyspnea   . Heart murmur   . HOH (hard of hearing)   . Hyperlipidemia   . Hypothyroidism     Patient Active Problem List   Diagnosis Date Noted  . NSTEMI (non-ST elevated myocardial infarction) ([place]) 08/26/2019  . Anxiety 08/26/2019  . Hypothyroidism 08/26/2019  . Arthritis 07/23/2016  . Morbid obesity due to excess calories ([place]) 07/21/2015  . Weakness 07/21/2015  . Heartburn 07/20/2013  . Back pain 07/20/2013  . SOB (shortness of breath) 08/29/2010  . Obesity 08/29/2010  . Mixed hyperlipidemia 08/29/2010    Past Surgical History:  Procedure Laterality Date  . APPENDECTOMY    . CATARACT EXTRACTION W/PHACO Left 09/17/2017   Procedure: CATARACT EXTRACTION PHACO AND INTRAOCULAR LENS PLACEMENT (IOC);  Surgeon: Birder Robson, MD;  Location: [facility] ORS;  Service: Ophthalmology;  Laterality: Left;  [place] 00:48 AP% 13.0 CDE 6.26 Fluid pack lot # [id number]  . CATARACT EXTRACTION W/PHACO Right 10/15/2017   Procedure: CATARACT EXTRACTION PHACO AND  INTRAOCULAR LENS PLACEMENT (IOC);  Surgeon: Birder Robson, MD;  Location: [facility] ORS;  Service: Ophthalmology;  Laterality: Right;  [place]  00:44 AP% 16.1 CDE 7.23 Fluid pack lot # [id number]  . CHOLECYSTECTOMY      Prior to Admission medications   Medication Sig Start Date End Date Taking? Authorizing Provider  acetaminophen (TYLENOL) 500 MG tablet Take 500 mg by mouth every 6 (six) hours as needed for moderate pain or headache.    [provider]  aspirin 81 MG tablet Take 81 mg by mouth daily.    [provider]  escitalopram (LEXAPRO) 10 MG tablet Take 15 mg by mouth daily.     [provider]  ezetimibe (ZETIA) 10 MG tablet Take 1 tablet by mouth once daily 06/02/19   Minna Merritts, MD  furosemide (LASIX) 20 MG tablet Take 1 tablet by mouth twice daily 07/06/19   Minna Merritts, MD  levothyroxine (SYNTHROID, LEVOTHROID) 50 MCG tablet Take 50 mcg by mouth daily before breakfast.    [provider]  potassium chloride (MICRO-K) 10 MEQ CR capsule Take 10 mEq by mouth 2 (two) times daily.  07/21/15   Minna Merritts, MD  rosuvastatin (CRESTOR) 5 MG tablet Take 1 tablet (5 mg total) by mouth daily. 05/06/19 08/04/19  Minna Merritts, MD    Allergies Statins  Family History  Problem Relation Age of Onset  . Heart attack Father   . Emphysema Mother   . Heart disease Mother     Social  History Social History   Tobacco Use  . Smoking status: Never Smoker  . Smokeless tobacco: Never Used  Vaping Use  . Vaping Use: Never used  Substance Use Topics  . Alcohol use: Not Currently  . Drug use: No    Review of Systems  Constitutional: No fever/chills Eyes: No visual changes. ENT: No sore throat. Cardiovascular: Positive for chest pain. Respiratory: Positive for shortness of breath. Gastrointestinal: No abdominal pain.  No nausea, no vomiting.  No diarrhea.  No constipation. Genitourinary: Negative for dysuria. Musculoskeletal: Negative for  back pain. Skin: Negative for rash. Neurological: Negative for headaches, focal weakness or numbness.   ____________________________________________   PHYSICAL EXAM:  VITAL SIGNS: ED Triage Vitals  Enc Vitals Group     BP 08/25/19 2119 136/60     Pulse Rate 08/25/19 2119 (!) 57     Resp 08/25/19 2119 (!) 26     Temp 08/25/19 2119 97.6 F (36.4 C)     Temp Source 08/25/19 2119 Oral     SpO2 08/25/19 2119 100 %     Weight 08/25/19 2121 180 lb (81.6 kg)     Height 08/25/19 2121 5\' 2"  (1.575 m)     Head Circumference --      Peak Flow --      Pain Score 08/25/19 2121 10     Pain Loc --      Pain Edu? --      Excl. in [place]? --     Constitutional: Alert and oriented.  Anxious appearing and in mild acute distress. Eyes: Conjunctivae are normal. PERRL. EOMI. Head: Atraumatic. Nose: No congestion/rhinnorhea. Mouth/Throat: Mucous membranes are moist.  Oropharynx non-erythematous. Neck: No stridor.   Cardiovascular: Normal rate, regular rhythm. Grossly normal heart sounds.  Good peripheral circulation. Respiratory: Normal respiratory effort.  No retractions. Lungs CTAB. Gastrointestinal: Soft and nontender to light or deep palpation. No distention. No abdominal bruits. No CVA tenderness. Musculoskeletal: No lower extremity tenderness nor edema.  No joint effusions. Neurologic:  Normal speech and language. No gross focal neurologic deficits are appreciated. No gait instability. Skin:  Skin is warm, dry and intact. No rash noted. Psychiatric: Mood and affect are anxious. Speech and behavior are normal.  ____________________________________________   LABS (all labs ordered are listed, but only abnormal results are displayed)  Labs Reviewed  BASIC METABOLIC PANEL - Abnormal; Notable for the following components:      Result Value   Glucose, Bld 111 (*)    BUN 25 (*)    Creatinine, Ser 1.06 (*)    GFR calc non Af Amer 50 (*)    GFR calc Af Amer 57 (*)    All other components  within normal limits  TROPONIN I (HIGH SENSITIVITY) - Abnormal; Notable for the following components:   Troponin I (High Sensitivity) 70 (*)    All other components within normal limits  TROPONIN I (HIGH SENSITIVITY) - Abnormal; Notable for the following components:   Troponin I (High Sensitivity) 1,155 (*)    All other components within normal limits  SARS CORONAVIRUS 2 BY RT PCR (HOSPITAL ORDER, [place] [facility])  CBC  APTT  PROTIME-INR  HEPARIN LEVEL (UNFRACTIONATED)   ____________________________________________  EKG  ED ECG REPORT I, Meosha Castanon J, the attending physician, personally viewed and interpreted this ECG.   Date: 08/26/2019  EKG Time: 2109  Rate: 58  Rhythm: normal EKG, normal sinus rhythm  Axis: Normal  Intervals:none  ST&T Change: Nonspecific  ____________________________________________  RADIOLOGY  ED MD interpretation: No acute cardiopulmonary process  Official radiology report(s): DG Chest 2 View  Result Date: 08/25/2019 CLINICAL DATA:  Left-sided chest pain. EXAM: CHEST - 2 VIEW COMPARISON:  Jun 22, 2019 FINDINGS: There is no evidence of acute infiltrate, pleural effusion or pneumothorax. The heart size and mediastinal contours are within normal limits. Multilevel degenerative changes seen throughout the thoracic spine. IMPRESSION: No active cardiopulmonary disease. Electronically Signed   By: Virgina Norfolk M.D.   On: 08/25/2019 21:58    ____________________________________________   PROCEDURES  Procedure(s) performed (including Critical Care):  .1-3 Lead EKG Interpretation Performed by: Paulette Blanch, MD Authorized by: Paulette Blanch, MD     Interpretation: normal     ECG rate:  60   ECG rate assessment: normal     Rhythm: sinus rhythm     Ectopy: none     Conduction: normal   Comments:     Patient placed on cardiac monitor to evaluate for arrhythmias   CRITICAL CARE Performed by: Paulette Blanch   Total critical  care time: 30 minutes  Critical care time was exclusive of separately billable procedures and treating other patients.  Critical care was necessary to treat or prevent imminent or life-threatening deterioration.  Critical care was time spent personally by me on the following activities: development of treatment plan with patient and/or surrogate as well as nursing, discussions with consultants, evaluation of patient's response to treatment, examination of patient, obtaining history from patient or surrogate, ordering and performing treatments and interventions, ordering and review of laboratory studies, ordering and review of radiographic studies, pulse oximetry and re-evaluation of patient's condition.   ____________________________________________   INITIAL IMPRESSION / ASSESSMENT AND PLAN / ED COURSE  As part of my medical decision making, I reviewed the following data within the [place] notes reviewed and incorporated, Labs reviewed, EKG interpreted, Old chart reviewed, Radiograph reviewed, Discussed with admitting physician and Notes from prior ED visits     Bonnie Hancock was evaluated in Emergency Department on 08/26/2019 for the symptoms described in the history of present illness. She was evaluated in the context of the global COVID-19 pandemic, which necessitated consideration that the patient might be at risk for infection with the SARS-CoV-2 virus that causes COVID-19. Institutional protocols and algorithms that pertain to the evaluation of patients at risk for COVID-19 are in a state of rapid change based on information released by regulatory bodies including the CDC and federal and state organizations. These policies and algorithms were followed during the patient's care in the ED.    79 year old female presenting with chest pain; recent stressors with husband being in the hospital. Differential diagnosis includes, but is not limited to, ACS, aortic  dissection, pulmonary embolism, cardiac tamponade, pneumothorax, pneumonia, pericarditis, myocarditis, GI-related causes including esophagitis/gastritis, and musculoskeletal chest wall pain.    Laboratory results remarkable for increasing troponin and mild AKI.  Will administer aspirin, nitroglycerin.  Initiate heparin bolus and drip.  Will discuss with hospitalist services for admission.      ____________________________________________   FINAL CLINICAL IMPRESSION(S) / ED DIAGNOSES  Final diagnoses:  NSTEMI (non-ST elevated myocardial infarction) ([place])  Unstable angina pectoris [facility])     ED Discharge Orders    None       Note:  This document was prepared using Dragon voice recognition software and may include unintentional dictation errors.   Paulette Blanch, MD 08/26/19 [phone]

## 2019-08-26 NOTE — Progress Notes (Signed)
ANTICOAGULATION CONSULT NOTE - Initial Consult  Pharmacy Consult for Heparin Indication: chest pain/ACS  Allergies  Allergen Reactions  . Statins Other (See Comments)    Muscle weakness   Patient Measurements: Height: 5\' 2"  (157.5 cm) Weight: 81.6 kg (180 lb) IBW/kg (Calculated) : 50.1 HEPARIN DW (KG): 68.3  Vital Signs: BP: 100/48 (07/07 0707) Pulse Rate: 50 (07/07 0707)  Labs: Recent Labs    08/25/19 2122 08/25/19 2333 08/26/19 0109 08/26/19 0900  HGB 14.0  --   --   --   HCT 42.5  --   --   --   PLT 227  --   --   --   APTT  --   --  32  --   LABPROT  --   --  12.9  --   INR  --   --  1.0  --   HEPARINUNFRC  --   --   --  0.99*  CREATININE 1.06*  --   --   --   TROPONINIHS 70* 1,155*  --  8,960*    Estimated Creatinine Clearance: 41.9 mL/min (A) (by C-G formula based on SCr of 1.06 mg/dL (H)).   Medical History: Past Medical History:  Diagnosis Date  . Anxiety   . Arthritis   . Cancer (HCC)    UTERINE  . Depression   . Dyspnea   . Heart murmur   . HOH (hard of hearing)   . Hyperlipidemia   . Hypothyroidism     Assessment: Pharmacy consulted for heparin infusion dosing and monitoring for 79 yo female for ACS/STEMI.  No anticoagulants PTA.  7/7 AM: Heparin infusion started @ 950 units/hr  Goal of Therapy:  Heparin level 0.3-0.7 units/ml Monitor platelets by anticoagulation protocol: Yes   Plan:  7/7 @ 0900 HL: 0.99. Level is supratherapeutic.  Will reduce infusion rate to 800 units/hr Recheck HL in 6 hours.  CBC daily per protocol.   Pernell Dupre, PharmD, BCPS Clinical Pharmacist 08/26/2019 10:21 AM

## 2019-08-27 ENCOUNTER — Other Ambulatory Visit: Payer: Self-pay

## 2019-08-27 ENCOUNTER — Inpatient Hospital Stay (HOSPITAL_COMMUNITY)
Admission: AD | Admit: 2019-08-27 | Discharge: 2019-09-11 | DRG: 236 | Disposition: A | Discharge status: Home Health | Payer: PPO | Source: Other Acute Inpatient Hospital | Attending: Cardiothoracic Surgery | Admitting: Cardiothoracic Surgery

## 2019-08-27 ENCOUNTER — Encounter: Payer: Self-pay | Admitting: Internal Medicine

## 2019-08-27 ENCOUNTER — Encounter
Admission: EM | Disposition: A | Discharge status: Other Acute Inpatient Hospital | Payer: Self-pay | Source: Home / Self Care | Attending: Emergency Medicine

## 2019-08-27 ENCOUNTER — Encounter (HOSPITAL_COMMUNITY): Payer: Self-pay | Admitting: Cardiovascular Disease

## 2019-08-27 DIAGNOSIS — E669 Obesity, unspecified: Secondary | ICD-10-CM | POA: Diagnosis present

## 2019-08-27 DIAGNOSIS — I214 Non-ST elevation (NSTEMI) myocardial infarction: Principal | ICD-10-CM | POA: Diagnosis present

## 2019-08-27 DIAGNOSIS — Z888 Allergy status to other drugs, medicaments and biological substances status: Secondary | ICD-10-CM

## 2019-08-27 DIAGNOSIS — F329 Major depressive disorder, single episode, unspecified: Secondary | ICD-10-CM | POA: Diagnosis present

## 2019-08-27 DIAGNOSIS — Z8249 Family history of ischemic heart disease and other diseases of the circulatory system: Secondary | ICD-10-CM

## 2019-08-27 DIAGNOSIS — J811 Chronic pulmonary edema: Secondary | ICD-10-CM | POA: Diagnosis not present

## 2019-08-27 DIAGNOSIS — E039 Hypothyroidism, unspecified: Secondary | ICD-10-CM | POA: Diagnosis not present

## 2019-08-27 DIAGNOSIS — J984 Other disorders of lung: Secondary | ICD-10-CM | POA: Diagnosis not present

## 2019-08-27 DIAGNOSIS — F419 Anxiety disorder, unspecified: Secondary | ICD-10-CM | POA: Diagnosis present

## 2019-08-27 DIAGNOSIS — E785 Hyperlipidemia, unspecified: Secondary | ICD-10-CM | POA: Diagnosis not present

## 2019-08-27 DIAGNOSIS — I700 Atherosclerosis of aorta: Secondary | ICD-10-CM | POA: Diagnosis present

## 2019-08-27 DIAGNOSIS — Z951 Presence of aortocoronary bypass graft: Secondary | ICD-10-CM | POA: Diagnosis not present

## 2019-08-27 DIAGNOSIS — E119 Type 2 diabetes mellitus without complications: Secondary | ICD-10-CM | POA: Diagnosis present

## 2019-08-27 DIAGNOSIS — Z01818 Encounter for other preprocedural examination: Secondary | ICD-10-CM | POA: Diagnosis not present

## 2019-08-27 DIAGNOSIS — I25110 Atherosclerotic heart disease of native coronary artery with unstable angina pectoris: Secondary | ICD-10-CM

## 2019-08-27 DIAGNOSIS — I5042 Chronic combined systolic (congestive) and diastolic (congestive) heart failure: Secondary | ICD-10-CM | POA: Diagnosis present

## 2019-08-27 DIAGNOSIS — Z01810 Encounter for preprocedural cardiovascular examination: Secondary | ICD-10-CM | POA: Diagnosis not present

## 2019-08-27 DIAGNOSIS — N179 Acute kidney failure, unspecified: Secondary | ICD-10-CM | POA: Diagnosis not present

## 2019-08-27 DIAGNOSIS — I200 Unstable angina: Secondary | ICD-10-CM

## 2019-08-27 DIAGNOSIS — J9811 Atelectasis: Secondary | ICD-10-CM | POA: Diagnosis present

## 2019-08-27 DIAGNOSIS — R911 Solitary pulmonary nodule: Secondary | ICD-10-CM | POA: Diagnosis not present

## 2019-08-27 DIAGNOSIS — E8770 Fluid overload, unspecified: Secondary | ICD-10-CM | POA: Diagnosis not present

## 2019-08-27 DIAGNOSIS — R531 Weakness: Secondary | ICD-10-CM | POA: Diagnosis not present

## 2019-08-27 DIAGNOSIS — M1990 Unspecified osteoarthritis, unspecified site: Secondary | ICD-10-CM | POA: Diagnosis present

## 2019-08-27 DIAGNOSIS — E782 Mixed hyperlipidemia: Secondary | ICD-10-CM

## 2019-08-27 DIAGNOSIS — Y848 Other medical procedures as the cause of abnormal reaction of the patient, or of later complication, without mention of misadventure at the time of the procedure: Secondary | ICD-10-CM | POA: Diagnosis not present

## 2019-08-27 DIAGNOSIS — E876 Hypokalemia: Secondary | ICD-10-CM | POA: Diagnosis not present

## 2019-08-27 DIAGNOSIS — R079 Chest pain, unspecified: Secondary | ICD-10-CM | POA: Diagnosis present

## 2019-08-27 DIAGNOSIS — Z6833 Body mass index (BMI) 33.0-33.9, adult: Secondary | ICD-10-CM | POA: Diagnosis not present

## 2019-08-27 DIAGNOSIS — Y92230 Patient room in hospital as the place of occurrence of the external cause: Secondary | ICD-10-CM | POA: Diagnosis not present

## 2019-08-27 DIAGNOSIS — R011 Cardiac murmur, unspecified: Secondary | ICD-10-CM | POA: Diagnosis present

## 2019-08-27 DIAGNOSIS — Z79899 Other long term (current) drug therapy: Secondary | ICD-10-CM

## 2019-08-27 DIAGNOSIS — T8089XA Other complications following infusion, transfusion and therapeutic injection, initial encounter: Secondary | ICD-10-CM | POA: Diagnosis not present

## 2019-08-27 DIAGNOSIS — J90 Pleural effusion, not elsewhere classified: Secondary | ICD-10-CM | POA: Diagnosis not present

## 2019-08-27 DIAGNOSIS — I25119 Atherosclerotic heart disease of native coronary artery with unspecified angina pectoris: Secondary | ICD-10-CM | POA: Diagnosis not present

## 2019-08-27 DIAGNOSIS — I517 Cardiomegaly: Secondary | ICD-10-CM | POA: Diagnosis not present

## 2019-08-27 DIAGNOSIS — R21 Rash and other nonspecific skin eruption: Secondary | ICD-10-CM | POA: Diagnosis not present

## 2019-08-27 DIAGNOSIS — H9190 Unspecified hearing loss, unspecified ear: Secondary | ICD-10-CM | POA: Diagnosis not present

## 2019-08-27 DIAGNOSIS — Z419 Encounter for procedure for purposes other than remedying health state, unspecified: Secondary | ICD-10-CM

## 2019-08-27 DIAGNOSIS — D62 Acute posthemorrhagic anemia: Secondary | ICD-10-CM | POA: Diagnosis not present

## 2019-08-27 DIAGNOSIS — Z7982 Long term (current) use of aspirin: Secondary | ICD-10-CM

## 2019-08-27 DIAGNOSIS — I2510 Atherosclerotic heart disease of native coronary artery without angina pectoris: Secondary | ICD-10-CM | POA: Diagnosis not present

## 2019-08-27 DIAGNOSIS — I10 Essential (primary) hypertension: Secondary | ICD-10-CM | POA: Diagnosis not present

## 2019-08-27 DIAGNOSIS — I110 Hypertensive heart disease with heart failure: Secondary | ICD-10-CM | POA: Diagnosis not present

## 2019-08-27 DIAGNOSIS — R7303 Prediabetes: Secondary | ICD-10-CM | POA: Diagnosis not present

## 2019-08-27 HISTORY — PX: LEFT HEART CATH AND CORONARY ANGIOGRAPHY: CATH118249

## 2019-08-27 HISTORY — DX: Atherosclerotic heart disease of native coronary artery without angina pectoris: I25.10

## 2019-08-27 HISTORY — DX: Non-ST elevation (NSTEMI) myocardial infarction: I21.4

## 2019-08-27 LAB — CBC
HCT: 38 % (ref 36.0–46.0)
Hemoglobin: 12.4 g/dL (ref 12.0–15.0)
MCH: 30.9 pg (ref 26.0–34.0)
MCHC: 32.6 g/dL (ref 30.0–36.0)
MCV: 94.8 fL (ref 80.0–100.0)
Platelets: 185 10*3/uL (ref 150–400)
RBC: 4.01 MIL/uL (ref 3.87–5.11)
RDW: 13.4 % (ref 11.5–15.5)
WBC: 8 10*3/uL (ref 4.0–10.5)
nRBC: 0 % (ref 0.0–0.2)

## 2019-08-27 LAB — BASIC METABOLIC PANEL
Anion gap: 7 (ref 5–15)
BUN: 30 mg/dL — ABNORMAL HIGH (ref 8–23)
CO2: 28 mmol/L (ref 22–32)
Calcium: 9.1 mg/dL (ref 8.9–10.3)
Chloride: 103 mmol/L (ref 98–111)
Creatinine, Ser: 0.99 mg/dL (ref 0.44–1.00)
GFR calc Af Amer: >60 mL/min (ref >60)
GFR calc non Af Amer: 54 mL/min — ABNORMAL LOW (ref >60)
Glucose, Bld: 107 mg/dL — ABNORMAL HIGH (ref 70–99)
Potassium: 4.3 mmol/L (ref 3.5–5.1)
Sodium: 138 mmol/L (ref 135–145)

## 2019-08-27 LAB — HEPARIN LEVEL (UNFRACTIONATED): Heparin Unfractionated: 0.15 IU/mL — ABNORMAL LOW (ref 0.30–0.70)

## 2019-08-27 LAB — LIPID PANEL
Cholesterol: 178 mg/dL (ref 0–200)
HDL: 35 mg/dL — ABNORMAL LOW (ref >40)
LDL Cholesterol: 102 mg/dL — ABNORMAL HIGH (ref 0–99)
Total CHOL/HDL Ratio: 5.1 RATIO
Triglycerides: 206 mg/dL — ABNORMAL HIGH (ref <150)
VLDL: 41 mg/dL — ABNORMAL HIGH (ref 0–40)

## 2019-08-27 LAB — MAGNESIUM: Magnesium: 2.2 mg/dL (ref 1.7–2.4)

## 2019-08-27 SURGERY — LEFT HEART CATH AND CORONARY ANGIOGRAPHY
Anesthesia: Moderate Sedation

## 2019-08-27 MED ORDER — SODIUM CHLORIDE 0.9 % WEIGHT BASED INFUSION: 1.0000 mL/kg/h | INTRAVENOUS | Status: DC

## 2019-08-27 MED ORDER — METOPROLOL TARTRATE 12.5 MG HALF TABLET
12.5000 mg | ORAL_TABLET | Freq: Two times a day (BID) | ORAL | Status: DC
  Administered 2019-08-27 – 2019-09-03 (×12): 12.5 mg via ORAL
  Filled 2019-08-27 (×13): qty 1

## 2019-08-27 MED ORDER — HEPARIN BOLUS VIA INFUSION
2000.0000 [IU] | Freq: Once | INTRAVENOUS | Status: AC
  Administered 2019-08-27: 2000 [IU] via INTRAVENOUS
  Filled 2019-08-27: qty 2000

## 2019-08-27 MED ORDER — NITROGLYCERIN 0.4 MG SL SUBL: 0.4000 mg | SUBLINGUAL_TABLET | SUBLINGUAL | 12 refills | Status: DC | PRN

## 2019-08-27 MED ORDER — ASPIRIN 81 MG PO CHEW
CHEWABLE_TABLET | ORAL | Status: AC
  Filled 2019-08-27: qty 1

## 2019-08-27 MED ORDER — EZETIMIBE 10 MG PO TABS
10.0000 mg | ORAL_TABLET | Freq: Every day | ORAL | Status: DC
  Administered 2019-08-27 – 2019-09-03 (×8): 10 mg via ORAL
  Filled 2019-08-27 (×8): qty 1

## 2019-08-27 MED ORDER — HEPARIN (PORCINE) 25000 UT/250ML-% IV SOLN: 1000.0000 [IU]/h | INTRAVENOUS | Status: DC

## 2019-08-27 MED ORDER — ASPIRIN 81 MG PO CHEW
81.0000 mg | CHEWABLE_TABLET | ORAL | Status: AC
  Administered 2019-08-27: 81 mg via ORAL

## 2019-08-27 MED ORDER — METOPROLOL TARTRATE 25 MG PO TABS: 12.5000 mg | ORAL_TABLET | Freq: Two times a day (BID) | ORAL | Status: DC

## 2019-08-27 MED ORDER — MIDAZOLAM HCL 2 MG/2ML IJ SOLN
INTRAMUSCULAR | Status: AC
  Filled 2019-08-27: qty 2

## 2019-08-27 MED ORDER — HEPARIN (PORCINE) 25000 UT/250ML-% IV SOLN
1000.0000 [IU]/h | INTRAVENOUS | Status: DC
  Administered 2019-08-27 – 2019-09-03 (×8): 1000 [IU]/h via INTRAVENOUS
  Filled 2019-08-27 (×9): qty 250

## 2019-08-27 MED ORDER — IOHEXOL 300 MG/ML  SOLN
INTRAMUSCULAR | Status: DC | PRN
  Administered 2019-08-27: 90 mL

## 2019-08-27 MED ORDER — HEPARIN (PORCINE) IN NACL 1000-0.9 UT/500ML-% IV SOLN
INTRAVENOUS | Status: AC
  Filled 2019-08-27: qty 1000

## 2019-08-27 MED ORDER — LOSARTAN POTASSIUM 25 MG PO TABS
12.5000 mg | ORAL_TABLET | Freq: Every day | ORAL | Status: DC
  Administered 2019-08-27 – 2019-09-02 (×7): 12.5 mg via ORAL
  Filled 2019-08-27 (×8): qty 1

## 2019-08-27 MED ORDER — NITROGLYCERIN 0.4 MG SL SUBL: 0.4000 mg | SUBLINGUAL_TABLET | SUBLINGUAL | Status: DC | PRN

## 2019-08-27 MED ORDER — SODIUM CHLORIDE 0.9 % WEIGHT BASED INFUSION
3.0000 mL/kg/h | INTRAVENOUS | Status: DC
  Administered 2019-08-27: 3 mL/kg/h via INTRAVENOUS

## 2019-08-27 MED ORDER — ACETAMINOPHEN 325 MG PO TABS
650.0000 mg | ORAL_TABLET | ORAL | Status: DC | PRN
  Administered 2019-08-29 – 2019-09-01 (×3): 650 mg via ORAL
  Filled 2019-08-27 (×3): qty 2

## 2019-08-27 MED ORDER — HEPARIN (PORCINE) IN NACL 1000-0.9 UT/500ML-% IV SOLN
INTRAVENOUS | Status: DC | PRN
  Administered 2019-08-27: 500 mL

## 2019-08-27 MED ORDER — MIDAZOLAM HCL 2 MG/2ML IJ SOLN
INTRAMUSCULAR | Status: DC | PRN
  Administered 2019-08-27: 0.5 mg via INTRAVENOUS

## 2019-08-27 MED ORDER — ASPIRIN EC 81 MG PO TBEC: 81.0000 mg | DELAYED_RELEASE_TABLET | Freq: Every day | ORAL | Status: DC

## 2019-08-27 MED ORDER — ROSUVASTATIN CALCIUM 5 MG PO TABS
5.0000 mg | ORAL_TABLET | Freq: Every day | ORAL | Status: DC
  Administered 2019-08-27 – 2019-09-10 (×15): 5 mg via ORAL
  Filled 2019-08-27 (×15): qty 1

## 2019-08-27 MED ORDER — SODIUM CHLORIDE 0.9 % IV SOLN: 4.0000 mg | Freq: Four times a day (QID) | INTRAVENOUS | Status: DC | PRN

## 2019-08-27 MED ORDER — SODIUM CHLORIDE 0.9 % IV SOLN: 250.0000 mL | INTRAVENOUS | Status: DC | PRN

## 2019-08-27 MED ORDER — FENTANYL CITRATE (PF) 100 MCG/2ML IJ SOLN
INTRAMUSCULAR | Status: DC | PRN
  Administered 2019-08-27: 25 ug via INTRAVENOUS

## 2019-08-27 MED ORDER — ASPIRIN EC 81 MG PO TBEC
81.0000 mg | DELAYED_RELEASE_TABLET | Freq: Every day | ORAL | Status: DC
  Administered 2019-08-28 – 2019-09-03 (×7): 81 mg via ORAL
  Filled 2019-08-27 (×7): qty 1

## 2019-08-27 MED ORDER — SODIUM CHLORIDE 0.9% FLUSH: 3.0000 mL | INTRAVENOUS | Status: DC | PRN

## 2019-08-27 MED ORDER — FENTANYL CITRATE (PF) 100 MCG/2ML IJ SOLN
INTRAMUSCULAR | Status: AC
  Filled 2019-08-27: qty 2

## 2019-08-27 MED ORDER — SODIUM CHLORIDE 0.9% FLUSH: 3.0000 mL | Freq: Two times a day (BID) | INTRAVENOUS | Status: DC

## 2019-08-27 SURGICAL SUPPLY — 10 items
CATH INFINITI 5FR ANG PIGTAIL (CATHETERS) ×2 IMPLANT
CATH INFINITI 5FR JL4 (CATHETERS) ×2 IMPLANT
CATH INFINITI JR4 5F (CATHETERS) ×2 IMPLANT
DEVICE CLOSURE MYNXGRIP 5F (Vascular Products) ×2 IMPLANT
KIT MANI 3VAL PERCEP (MISCELLANEOUS) ×2 IMPLANT
NEEDLE PERC 18GX7CM (NEEDLE) ×2 IMPLANT
PACK CARDIAC CATH (CUSTOM PROCEDURE TRAY) ×2 IMPLANT
SHEATH AVANTI 5FR X 11CM (SHEATH) ×2 IMPLANT
SHEATH AVANTI 7FRX11 (SHEATH) IMPLANT
WIRE GUIDERIGHT .035X150 (WIRE) ×2 IMPLANT

## 2019-08-27 NOTE — Progress Notes (Signed)
ANTICOAGULATION CONSULT NOTE   Pharmacy Consult for Heparin Indication: chest pain/ACS  Notes from [facility] reviewed. Orders for heparin to restart at 6pm entered. Will follow along at [facility].   Erin Hearing PharmD., BCPS Clinical Pharmacist 08/27/2019 3:28 PM

## 2019-08-27 NOTE — Progress Notes (Addendum)
ANTICOAGULATION CONSULT NOTE -   Pharmacy Consult for Heparin Indication: chest pain/ACS  Allergies  Allergen Reactions  . Statins Other (See Comments)    Muscle weakness   Patient Measurements: Height: 5\' 2"  (157.5 cm) Weight: 81.6 kg (180 lb) IBW/kg (Calculated) : 50.1 HEPARIN DW (KG): 68.3  Vital Signs: Temp: 97.9 F (36.6 C) (07/08 1208) Temp Source: Oral (07/08 1208) BP: 122/109 (07/08 1208) Pulse Rate: 55 (07/08 1208)  Labs: Recent Labs    08/25/19 2122 08/25/19 2122 08/25/19 2333 08/26/19 0109 08/26/19 0900 08/26/19 1202 08/26/19 1654 08/27/19 0051  HGB 14.0   < >  --   --   --   --  13.6 12.4  HCT 42.5  --   --   --   --   --  39.3 38.0  PLT 227  --   --   --   --   --  203 185  APTT  --   --   --  32  --   --   --   --   LABPROT  --   --   --  12.9  --   --   --   --   INR  --   --   --  1.0  --   --   --   --   HEPARINUNFRC  --   --   --   --  0.99*  --  0.68 0.15*  CREATININE 1.06*  --   --   --   --   --   --  0.99  TROPONINIHS 70*   < > 1,155*  --  8,960* 6,211*  --   --    < > = values in this interval not displayed.    Estimated Creatinine Clearance: 44.9 mL/min (by C-G formula based on SCr of 0.99 mg/dL).   Medical History: Past Medical History:  Diagnosis Date  . Anxiety   . Arthritis   . Cancer (HCC)    UTERINE  . Depression   . Dyspnea   . Heart murmur   . HOH (hard of hearing)   . Hyperlipidemia   . Hypothyroidism     Assessment: Pharmacy consulted for heparin infusion dosing and monitoring for 79 yo female for ACS/STEMI.  No anticoagulants PTA.  7/7 AM: Heparin infusion started @ 950 units/hr  7/7 1654 HL 0.68  7/8 0051 HL 0.15 unsure why the heparin level dropped. Previous Radio broadcast assistant and noted no problems with heparin line or no stop in therapy. -  2000 unit rebolus and increase drip to 1000 units/hr.  Goal of Therapy:  Heparin level 0.3-0.7 units/ml Monitor platelets by anticoagulation protocol: Yes   Plan:   Patient to be transferred to [facility] for CABG. Plan to restart heparin at 1800 at the previous rate of 1000 units/hr. Anti-xa level order 8 hours post infusion start. CBC daily while on heparin.   Oswald Hillock, PharmD, BCPS Clinical Pharmacist 08/27/2019 12:26 PM

## 2019-08-27 NOTE — Care Management Obs Status (Signed)
[facility] NOTIFICATION   Patient Details  Name: Bonnie Hancock MRN: [id number] Date of Birth: 1940-04-14   Medicare Observation Status Notification Given:  Yes    Letanya Froh A Prestin Munch, LCSW 08/27/2019, 1:00 PM

## 2019-08-27 NOTE — Discharge Summary (Signed)
Physician Discharge Summary  Bonnie Hancock MWN:[id number] DOB: 1940-10-02   PCP: Albina Billet, MD  Admit date: 08/26/2019 Discharge date: 08/27/2019 Length of Stay: 0 days   Code Status: Full Code   Admitted From:  Home Discharge Condition:  [facility] Summary  Bonnie Hancock is a 79 y.o. female with a history of anxiety and hypothyroidism who presented with chest pain and was admitted for NSTEMI. She was placed on heparin drip and given SL nitro in the ED. Cardiology was consulted. Patient went for cardiac cath on 08/27/19 which showed multivessel disease. Cardiology discussed case with interventional cardiology. Per cardiology: "Patient unable to make a decision whether she would prefer CABG versus stenting. Decision made to transfer to [facility] for further consideration of high risk stenting, would likely need multiprocedure versus CABG which may give her the best long-lasting result given multivessel disease especially in light of previous medication intolerances."  Transferred in stable condition to cardiology service at [facility] for further evaluation.   A & P   Active Problems:   NSTEMI (non-ST elevated myocardial infarction) (HCC)   Anxiety   Hypothyroidism   1. NSTEMI a. Presenting EKG with ST depressions in V1-V2 with concerning ST changes in inferolateral leads on personal read, resolved on repeat EKG  b. Troponin peaked at 8,960 c. Abnormal cath and plan as above  2. Asymptomatic bradycardia a. Beta blocker with hold parameters b. Continue tele  3. HFpEF, compensated a. Echo: EF 60 to 65% and otherwise unremarkable overall  4. HLD on Zetia and Crestor at home  5. Pulmonary nodule  6. Anxiety on Lexapro  7. Hypothyroidism not on levothyroxine at home     Consultants  . cardiology  Procedures  cardiac cath on 08/27/19 which showed multivessel disease  Antibiotics   Anti-infectives (From admission, onward)   None       Subjective  Seen post cardiac  cath with family at bedside. Patient doing well without any recurrent chest pain and no palpitations, shortness of breath or any other complaints.   Objective   Discharge Exam: Vitals:   08/27/19 1128 08/27/19 1208  BP: (!) 134/48 (!) 122/109  Pulse: (!) 58 (!) 55  Resp: 15 16  Temp:  97.9 F (36.6 C)  SpO2: 96% 98%   Vitals:   08/27/19 1113 08/27/19 1115 08/27/19 1128 08/27/19 1208  BP: 104/64 (!) 122/49 (!) 134/48 (!) 122/109  Pulse: (!) 54 61 (!) 58 (!) 55  Resp: 14 17 15 16   Temp:    97.9 F (36.6 C)  TempSrc:    Oral  SpO2: 97% 97% 96% 98%  Weight:      Height:        Physical Exam Vitals and nursing note reviewed.  Constitutional:      Appearance: Normal appearance.  HENT:     Head: Normocephalic and atraumatic.  Eyes:     Conjunctiva/sclera: Conjunctivae normal.  Cardiovascular:     Rate and Rhythm: Regular rhythm. Bradycardia present.     Heart sounds: Normal heart sounds.  Pulmonary:     Effort: Pulmonary effort is normal.     Breath sounds: Normal breath sounds.  Abdominal:     General: Abdomen is flat.     Palpations: Abdomen is soft.  Musculoskeletal:        General: No swelling or tenderness.  Skin:    Coloration: Skin is not jaundiced or pale.  Neurological:     Mental Status: She is alert.  Mental status is at baseline.  Psychiatric:        Mood and Affect: Mood normal.        Behavior: Behavior normal.       The results of significant diagnostics from this hospitalization (including imaging, microbiology, ancillary and laboratory) are listed below for reference.     Microbiology: Recent Results (from the past 240 hour(s))  SARS Coronavirus 2 by RT PCR (hospital order, performed in [facility] hospital lab) Nasopharyngeal Nasopharyngeal Swab     Status: None   Collection Time: 08/26/19  6:25 PM   Specimen: Nasopharyngeal Swab  Result Value Ref Range Status   SARS Coronavirus 2 NEGATIVE NEGATIVE Final    Comment: (NOTE) SARS-CoV-2  target nucleic acids are NOT DETECTED.  The SARS-CoV-2 RNA is generally detectable in upper and lower respiratory specimens during the acute phase of infection. The lowest concentration of SARS-CoV-2 viral copies this assay can detect is 250 copies / mL. A negative result does not preclude SARS-CoV-2 infection and should not be used as the sole basis for treatment or other patient management decisions.  A negative result may occur with improper specimen collection / handling, submission of specimen other than nasopharyngeal swab, presence of viral mutation(s) within the areas targeted by this assay, and inadequate number of viral copies (<250 copies / mL). A negative result must be combined with clinical observations, patient history, and epidemiological information.  Fact Sheet for Patients:   StrictlyIdeas.no  Fact Sheet for Healthcare Providers: BankingDealers.co.za  This test is not yet approved or  cleared by the [place] FDA and has been authorized for detection and/or diagnosis of SARS-CoV-2 by FDA under an Emergency Use Authorization (EUA).  This EUA will remain in effect (meaning this test can be used) for the duration of the COVID-19 declaration under Section 564(b)(1) of the Act, 21 U.S.C. section 360bbb-3(b)(1), unless the authorization is terminated or revoked sooner.  Performed at [facility], [address] [place], [place] [address]      Labs: BNP (last 3 results) Recent Labs    06/22/19 1422  BNP 50.5   Basic Metabolic Panel: Recent Labs  Lab 08/25/19 2122 08/27/19 0051  NA 142 138  K 4.1 4.3  CL 103 103  CO2 24 28  GLUCOSE 111* 107*  BUN 25* 30*  CREATININE 1.06* 0.99  CALCIUM 9.9 9.1  MG  --  2.2   Liver Function Tests: No results for input(s): AST, ALT, ALKPHOS, BILITOT, PROT, ALBUMIN in the last 168 hours. No results for input(s): LIPASE, AMYLASE in the last 168 hours. No  results for input(s): AMMONIA in the last 168 hours. CBC: Recent Labs  Lab 08/25/19 2122 08/26/19 1654 08/27/19 0051  WBC 9.1 8.7 8.0  HGB 14.0 13.6 12.4  HCT 42.5 39.3 38.0  MCV 93.0 91.2 94.8  PLT 227 203 185   Cardiac Enzymes: No results for input(s): CKTOTAL, CKMB, CKMBINDEX, TROPONINI in the last 168 hours. BNP: Invalid input(s): POCBNP CBG: No results for input(s): GLUCAP in the last 168 hours. D-Dimer No results for input(s): DDIMER in the last 72 hours. Hgb A1c No results for input(s): HGBA1C in the last 72 hours. Lipid Profile Recent Labs    08/27/19 0051  CHOL 178  HDL 35*  LDLCALC 102*  TRIG 206*  CHOLHDL 5.1   Thyroid function studies No results for input(s): TSH, T4TOTAL, T3FREE, THYROIDAB in the last 72 hours.  Invalid input(s): FREET3 Anemia work up No results for input(s): VITAMINB12,  FOLATE, FERRITIN, TIBC, IRON, RETICCTPCT in the last 72 hours. Urinalysis    Component Value Date/Time   COLORURINE YELLOW (A) 06/22/2019 1819   APPEARANCEUR CLEAR (A) 06/22/2019 1819   LABSPEC 1.032 (H) 06/22/2019 1819   PHURINE 5.0 06/22/2019 1819   GLUCOSEU NEGATIVE 06/22/2019 1819   HGBUR SMALL (A) 06/22/2019 1819   BILIRUBINUR NEGATIVE 06/22/2019 1819   KETONESUR NEGATIVE 06/22/2019 1819   PROTEINUR NEGATIVE 06/22/2019 1819   NITRITE NEGATIVE 06/22/2019 1819   LEUKOCYTESUR NEGATIVE 06/22/2019 1819   Sepsis Labs Invalid input(s): PROCALCITONIN,  WBC,  LACTICIDVEN Microbiology Recent Results (from the past 240 hour(s))  SARS Coronavirus 2 by RT PCR (hospital order, performed in [facility] hospital lab) Nasopharyngeal Nasopharyngeal Swab     Status: None   Collection Time: 08/26/19  6:25 PM   Specimen: Nasopharyngeal Swab  Result Value Ref Range Status   SARS Coronavirus 2 NEGATIVE NEGATIVE Final    Comment: (NOTE) SARS-CoV-2 target nucleic acids are NOT DETECTED.  The SARS-CoV-2 RNA is generally detectable in upper and lower respiratory specimens  during the acute phase of infection. The lowest concentration of SARS-CoV-2 viral copies this assay can detect is 250 copies / mL. A negative result does not preclude SARS-CoV-2 infection and should not be used as the sole basis for treatment or other patient management decisions.  A negative result may occur with improper specimen collection / handling, submission of specimen other than nasopharyngeal swab, presence of viral mutation(s) within the areas targeted by this assay, and inadequate number of viral copies (<250 copies / mL). A negative result must be combined with clinical observations, patient history, and epidemiological information.  Fact Sheet for Patients:   StrictlyIdeas.no  Fact Sheet for Healthcare Providers: BankingDealers.co.za  This test is not yet approved or  cleared by the [place] FDA and has been authorized for detection and/or diagnosis of SARS-CoV-2 by FDA under an Emergency Use Authorization (EUA).  This EUA will remain in effect (meaning this test can be used) for the duration of the COVID-19 declaration under Section 564(b)(1) of the Act, 21 U.S.C. section 360bbb-3(b)(1), unless the authorization is terminated or revoked sooner.  Performed at [facility], [address], [place], [place] [address]     Discharge Instructions     Discharge Instructions    Diet - low sodium heart healthy   Complete by: As directed    Increase activity slowly   Complete by: As directed      Allergies as of 08/27/2019      Reactions   Statins Other (See Comments)   Muscle weakness      Medication List    STOP taking these medications   amoxicillin 500 MG capsule Commonly known as: AMOXIL     TAKE these medications   acetaminophen 500 MG tablet Commonly known as: TYLENOL Take 500 mg by mouth every 6 (six) hours as needed for moderate pain or headache.   aspirin 81 MG tablet Take 81 mg by  mouth daily.   escitalopram 10 MG tablet Commonly known as: LEXAPRO Take 15 mg by mouth daily.   ezetimibe 10 MG tablet Commonly known as: ZETIA Take 1 tablet by mouth once daily   furosemide 20 MG tablet Commonly known as: LASIX Take 1 tablet by mouth twice daily   heparin 25000-0.45 UT/250ML-% infusion Inject 1,000 Units/hr into the vein continuous.   metoprolol tartrate 25 MG tablet Commonly known as: LOPRESSOR Take 0.5 tablets (12.5 mg total) by mouth 2 (two)  times daily.   nitroGLYCERIN 0.4 MG SL tablet Commonly known as: NITROSTAT Place 1 tablet (0.4 mg total) under the tongue every 5 (five) minutes x 3 doses as needed for chest pain.   potassium chloride 10 MEQ CR capsule Commonly known as: MICRO-K Take 10 mEq by mouth 2 (two) times daily.   rosuvastatin 5 MG tablet Commonly known as: CRESTOR Take 1 tablet (5 mg total) by mouth daily.       Allergies  Allergen Reactions  . Statins Other (See Comments)    Muscle weakness    Dispo: The patient is from: Home              Anticipated d/c is to: [facility]                     Time coordinating discharge: Over 30 minutes   SIGNED:   Harold Hedge, D.O. [facility] Pager: [phone]  08/27/2019, 1:28 PM

## 2019-08-27 NOTE — Progress Notes (Signed)
Progress Note  Patient Name: Bonnie Hancock Date of Encounter: 08/27/2019  [facility] Cardiologist: Ida Rogue, MD , [facility]  Subjective   Cardiac catheterization today, denies any chest pain symptoms at rest Has been on heparin since admission Catheterization showing left main disease, three-vessel coronary disease, results discussed with her in detail  Inpatient Medications    Scheduled Meds: . [START ON 08/28/2019] aspirin EC  81 mg Oral Daily  . metoprolol tartrate  12.5 mg Oral BID  . sodium chloride flush  3 mL Intravenous Once  . sodium chloride flush  3 mL Intravenous Q12H   Continuous Infusions: . heparin     PRN Meds: acetaminophen, ALPRAZolam, nitroGLYCERIN, ondansetron (ZOFRAN) IV   Vital Signs    Vitals:   08/27/19 1113 08/27/19 1115 08/27/19 1128 08/27/19 1208  BP: 104/64 (!) 122/49 (!) 134/48 (!) 122/109  Pulse: (!) 54 61 (!) 58 (!) 55  Resp: 14 17 15 16   Temp:    97.9 F (36.6 C)  TempSrc:    Oral  SpO2: 97% 97% 96% 98%  Weight:      Height:        Intake/Output Summary (Last 24 hours) at 08/27/2019 1338 Last data filed at 08/27/2019 0500 Gross per 24 hour  Intake 2240.5 ml  Output --  Net 2240.5 ml   Last 3 Weights 08/25/2019 06/24/2019 06/22/2019  Weight (lbs) 180 lb 185 lb 175 lb  Weight (kg) 81.647 kg 83.915 kg 79.379 kg      Telemetry    NSR - Personally Reviewed  ECG    - Personally Reviewed  Physical Exam   GEN: No acute distress.  Obese Neck: No JVD Cardiac: RRR, no murmurs, rubs, or gallops.  Respiratory: Clear to auscultation bilaterally. GI: Soft, nontender, non-distended  MS: No edema; No deformity. Neuro:  Nonfocal  Psych: Normal affect   Labs    High Sensitivity Troponin:   Recent Labs  Lab 08/25/19 2122 08/25/19 2333 08/26/19 0900 08/26/19 1202  TROPONINIHS 70* 1,155* 8,960* 6,211*      Chemistry Recent Labs  Lab 08/25/19 2122 08/27/19 0051  NA 142 138  K 4.1 4.3  CL 103 103  CO2 24 28  GLUCOSE  111* 107*  BUN 25* 30*  CREATININE 1.06* 0.99  CALCIUM 9.9 9.1  GFRNONAA 50* 54*  GFRAA 57* >60  ANIONGAP 15 7     Hematology Recent Labs  Lab 08/25/19 2122 08/26/19 1654 08/27/19 0051  WBC 9.1 8.7 8.0  RBC 4.57 4.31 4.01  HGB 14.0 13.6 12.4  HCT 42.5 39.3 38.0  MCV 93.0 91.2 94.8  MCH 30.6 31.6 30.9  MCHC 32.9 34.6 32.6  RDW 13.3 13.6 13.4  PLT 227 203 185    BNPNo results for input(s): BNP, PROBNP in the last 168 hours.   DDimer No results for input(s): DDIMER in the last 168 hours.   Radiology    DG Chest 2 View  Result Date: 08/25/2019 CLINICAL DATA:  Left-sided chest pain. EXAM: CHEST - 2 VIEW COMPARISON:  Jun 22, 2019 FINDINGS: There is no evidence of acute infiltrate, pleural effusion or pneumothorax. The heart size and mediastinal contours are within normal limits. Multilevel degenerative changes seen throughout the thoracic spine. IMPRESSION: No active cardiopulmonary disease. Electronically Signed   By: Virgina Norfolk M.D.   On: 08/25/2019 21:58   CARDIAC CATHETERIZATION  Result Date: 08/27/2019  Mid LM to Dist LM lesion is 70% stenosed.  Ost LAD to Prox LAD lesion is  80% stenosed.  Prox Cx to Mid Cx lesion is 70% stenosed.  Prox RCA to Mid RCA lesion is 99% stenosed.  2nd Mrg lesion is 60% stenosed.  Mid LAD lesion is 50% stenosed.  The left ventricular ejection fraction is 45-50% by visual estimate.  There is no mitral valve regurgitation.  LV end diastolic pressure is mildly elevated.  There is mild left ventricular systolic dysfunction.    ECHOCARDIOGRAM COMPLETE  Result Date: 08/26/2019    ECHOCARDIOGRAM REPORT   Patient Name:   Bonnie Hancock Date of Exam: 08/26/2019 Medical Rec #:  [id number]      Height:       62.0 in Accession #:    [id number]     Weight:       180.0 lb Date of Birth:  Mar 19, 1940     BSA:          1.828 m Patient Age:    79 years       BP:           100/48 mmHg Patient Gender: F              HR:           50 bpm. Exam Location:   [facility] Procedure: 2D Echo, Cardiac Doppler and Color Doppler Indications:     Chest pain 786.50  History:         Patient has no prior history of Echocardiogram examinations.                  Signs/Symptoms:Dyspnea and Murmur; Risk Factors:Dyslipidemia.  Sonographer:     Sherrie Sport RDCS (AE) Referring Phys:  [id number] Kate Sable Diagnosing Phys: Kate Sable MD  Sonographer Comments: No apical window. IMPRESSIONS  1. Left ventricular ejection fraction, by estimation, is 60 to 65%. The left ventricle has normal function. The left ventricle has no regional wall motion abnormalities. Left ventricular diastolic function could not be evaluated.  2. Right ventricular systolic function is normal. The right ventricular size is normal.  3. The mitral valve is normal in structure. Trivial mitral valve regurgitation. No evidence of mitral stenosis.  4. The aortic valve is normal in structure. Aortic valve regurgitation is not visualized. No aortic stenosis is present.  5. The inferior vena cava is normal in size with greater than 50% respiratory variability, suggesting right atrial pressure of 3 mmHg. FINDINGS  Left Ventricle: Left ventricular ejection fraction, by estimation, is 60 to 65%. The left ventricle has normal function. The left ventricle has no regional wall motion abnormalities. The left ventricular internal cavity size was normal in size. There is  no left ventricular hypertrophy. Left ventricular diastolic function could not be evaluated. Right Ventricle: The right ventricular size is normal. No increase in right ventricular wall thickness. Right ventricular systolic function is normal. Left Atrium: Left atrial size was normal in size. Right Atrium: Right atrial size was not well visualized. Pericardium: There is no evidence of pericardial effusion. Mitral Valve: The mitral valve is normal in structure. Normal mobility of the mitral valve leaflets. Trivial mitral valve regurgitation. No evidence of mitral  valve stenosis. Tricuspid Valve: The tricuspid valve is normal in structure. Tricuspid valve regurgitation is not demonstrated. No evidence of tricuspid stenosis. Aortic Valve: The aortic valve is normal in structure. Aortic valve regurgitation is not visualized. No aortic stenosis is present. Pulmonic Valve: The pulmonic valve was not well visualized. Pulmonic valve regurgitation is not visualized. No evidence of pulmonic stenosis. Aorta: The  aortic root is normal in size and structure. Venous: The inferior vena cava is normal in size with greater than 50% respiratory variability, suggesting right atrial pressure of 3 mmHg. IAS/Shunts: No atrial level shunt detected by color flow Doppler.  LEFT VENTRICLE PLAX 2D LVIDd:         4.32 cm LVIDs:         2.60 cm LV PW:         0.92 cm LV IVS:        1.03 cm LVOT diam:     2.00 cm LVOT Area:     3.14 cm  LEFT ATRIUM         Index LA diam:    3.10 cm 1.70 cm/m                        PULMONIC VALVE AORTA                 PV Vmax:        0.62 m/s Ao Root diam: 2.80 cm PV Peak grad:   1.5 mmHg                       RVOT Peak grad: 2 mmHg   SHUNTS Systemic Diam: 2.00 cm Kate Sable MD Electronically signed by Kate Sable MD Signature Date/Time: 08/26/2019/3:54:31 PM    Final     Cardiac Studies   Cardiac catheterization Inferior wall hypokinesis ejection fraction 50% Left main disease, ostial LAD disease, critical mid RCA disease, moderate to severe mid left circumflex disease, OM disease  Patient Profile     EMILE RINGGENBERG is a 79 y.o. female with a hx of chronic diastolic heart failure, obesity, HLD, hypothyroidism, and arthritis who is being seen today for the evaluation of NSTEMI   Assessment & Plan    NSTEMI Catheterization today, three-vessel disease also left main Case discussed with interventional cardiology Disposition likely to have further discussion in [facility] concerning CABG versus high risk stenting -Given mild diabetes,  multivessel, may be best served with CABG if felt to be a candidate -Restart heparin infusion this evening,no bolus Low-dose beta-blocker, ARB, restart statin (retry despite intolerance) Continue Zetia, aspirin  HFpEF -Hold off on any Lasix at this time given recent contrast load -We will be high risk of heart failure given inferior wall hypokinesis, ejection fraction mildly down  HLD Long history of poorly controlled Retry statin, continue Zetia, As outpatient will likely need PCSK9 inhibitor if unable to tolerate statin  Pulmonary nodule CT chest 5/2 with 4 mm left upper lobe nodule. has been referred to the pulmonary nodule clinic.    Aortic atherosclerosis Work on cholesterol  Morbid obesity, deconditioning Outpatient PT  Long discussion with family concerning catheterization results above, All calls placed to [facility] for transfer, orders placed  Total encounter time more than 35 minutes  Greater than 50% was spent in counseling and coordination of care with the patient    For questions or updates, please contact [place] Please consult [email or web address] for contact info under        Signed, Ida Rogue, MD  08/27/2019, 1:38 PM

## 2019-08-27 NOTE — Progress Notes (Signed)
ANTICOAGULATION CONSULT NOTE -   Pharmacy Consult for Heparin Indication: chest pain/ACS  Allergies  Allergen Reactions  . Statins Other (See Comments)    Muscle weakness   Patient Measurements: Height: 5\' 2"  (157.5 cm) Weight: 81.6 kg (180 lb) IBW/kg (Calculated) : 50.1 HEPARIN DW (KG): 68.3  Vital Signs: Temp: 98.2 F (36.8 C) (07/07 1934) Temp Source: Oral (07/07 1934) BP: 106/52 (07/07 2104) Pulse Rate: 58 (07/07 1934)  Labs: Recent Labs    08/25/19 2122 08/25/19 2122 08/25/19 2333 08/26/19 0109 08/26/19 0900 08/26/19 1202 08/26/19 1654 08/27/19 0051  HGB 14.0   < >  --   --   --   --  13.6 12.4  HCT 42.5  --   --   --   --   --  39.3 38.0  PLT 227  --   --   --   --   --  203 185  APTT  --   --   --  32  --   --   --   --   LABPROT  --   --   --  12.9  --   --   --   --   INR  --   --   --  1.0  --   --   --   --   HEPARINUNFRC  --   --   --   --  0.99*  --  0.68 0.15*  CREATININE 1.06*  --   --   --   --   --   --  0.99  TROPONINIHS 70*   < > 1,155*  --  8,960* 6,211*  --   --    < > = values in this interval not displayed.    Estimated Creatinine Clearance: 44.9 mL/min (by C-G formula based on SCr of 0.99 mg/dL).   Medical History: Past Medical History:  Diagnosis Date  . Anxiety   . Arthritis   . Cancer (HCC)    UTERINE  . Depression   . Dyspnea   . Heart murmur   . HOH (hard of hearing)   . Hyperlipidemia   . Hypothyroidism     Assessment: Pharmacy consulted for heparin infusion dosing and monitoring for 79 yo female for ACS/STEMI.  No anticoagulants PTA.  7/7 AM: Heparin infusion started @ 950 units/hr  Goal of Therapy:  Heparin level 0.3-0.7 units/ml Monitor platelets by anticoagulation protocol: Yes   Plan:  7/7 @ 0900 HL: 0.99. Level is supratherapeutic.  Will reduce infusion rate to 800 units/hr Recheck HL in 6 hours.  CBC daily per protocol.   7/7:  HL @ 1654 = 0.68 Will continue pt on current rate and recheck HL 6 hrs on  7/8 @ 0100.   7/8 0051 HL 0.15, SUBtherapeutic.  CBC stable.  RN confirmed no problems w/ infusion.  Will give 2000 unit rebolus and increase drip to 1000 units/hr.  Recheck HL in 8 hours.  Ena Dawley, PharmD Clinical Pharmacist 08/27/2019 2:24 AM

## 2019-08-27 NOTE — H&P (Addendum)
Cardiology Admission History and Physical:   Patient ID: OMUNIQUE PEDERSON MRN: [id number]; DOB: 11-25-1940   Admission date: (Not on file)  Primary Care Provider: Albina Billet, MD Primary Cardiologist: Ida Rogue, MD  Primary Electrophysiologist:  None   Chief Complaint:  Multivessel CAD  Patient Profile:   Bonnie Hancock is a 79 y.o. female with with a hx of HFrEF (4/7 echo, newly depressed EF 45-50%), NSTEMI with 08/27/19 LHC showing 3v CAD, obesity, HLD, hypothyroidism, and arthritis who is being transferred to [facility] for evaluation by CVTS for CABG versus stenting (multiple).   History of Present Illness:   Bonnie Hancock is an 79 year old female with history of chronic diastolic heart failure, obesity, hyperlipidemia, hypothyroidism, and arthritis.  She was last seen in clinic 06/24/2019.  Echo 2012 showed normal LVSF and mildly elevated RVSP consistent with mild pulmonary hypertension.  Since that time, this has been managed with Lasix.   She was seen in the ED 06/22/2019 with an episode of chest pain.  Per her report, she awoke 06/19/2019 with chest pain and diaphoresis.  Her chest discomfort lasted several minutes without radiation.  CP relieved with 1 nitro.  After that time, she was fatigued and experience shortness of breath/DOE.  ED work-up was without significant findings.  EKG was without ST/T changes.  Troponin negative.  D-dimer was elevated but CTA negative for PE.  Of note, on her CT, and incidental nodule was noted.  She was referred to the pulmonary nodule clinic.  She was seen in clinic 06/24/2019.  In clinic, the patient reported feeling well since discharge and without recurrent chest pain. No DOE. She felt as if she might have had a panic attack.  She reported feeling more stressed with her husband starting dialysis a month prior. CP was felt to be atypical as it occurred at rest.   On 08/25/2019, she presented to the [facility] emergency department with report of left  chest pain or tightness, moderate in intensity, and that radiated to her back and left arm and had started approximately 1 hour prior to her presentation.  CP occurred at rest and while riding in the car from the [facility] and after picking up her husband after he was discharged from a recent hospitalization.  She denied any associated symptoms other than shortness of breath.  No racing HR, palpitations, n/v, presyncope, or syncope. No diaphoresis. Per review of nursing notes, she arrived tachypneic and pale with dry skin.  In the ED, initial vitals were BP 136/30, HR 57 bpm, RR 26, 97.6 F, 100% on room air.  Labs significant for AKI and elevated troponin with creatinine 1.06, BUN 25, high-sensitivity troponin 70  1,155 and peaked at 8960.  EKG NSR without acute changes. She received ASA and was continued on a BB, low dose Crestor (statin intolerance), Zetia. She was started on a nitro drip and IV heparin. At time of consultation, she was CP free. Plan was for [facility] subsequent day.   On 7/8, she underwent LHC that showed diffuse 3v CAD with dz of distal LM, ostial LAD, mLCx, mRCA, ostial / proximal OM.  Echo showed newly depressed EF 45-50% with inferior wall hypokinesis and mild elevation of LVEDP by catheterization. Case discussed with interventional cardiology with decision to transfer to [facility] given high risk stenting with multiple stents versus CABG. Also considered were her multiple medication intolerances. Patient and family agreeable to transfer. [facility] contacted. Orders placed. IM notified to complete EMTALA form.  Heart Pathway Score:     Past Medical History:  Diagnosis Date  . Anxiety   . Arthritis   . Cancer (HCC)    UTERINE  . Depression   . Dyspnea   . Heart murmur   . HOH (hard of hearing)   . Hyperlipidemia   . Hypothyroidism     Past Surgical History:  Procedure Laterality Date  . APPENDECTOMY    . CATARACT EXTRACTION W/PHACO Left 09/17/2017   Procedure: CATARACT  EXTRACTION PHACO AND INTRAOCULAR LENS PLACEMENT (IOC);  Surgeon: Birder Robson, MD;  Location: [facility] ORS;  Service: Ophthalmology;  Laterality: Left;  [place] 00:48 AP% 13.0 CDE 6.26 Fluid pack lot # [id number]  . CATARACT EXTRACTION W/PHACO Right 10/15/2017   Procedure: CATARACT EXTRACTION PHACO AND INTRAOCULAR LENS PLACEMENT (IOC);  Surgeon: Birder Robson, MD;  Location: [facility] ORS;  Service: Ophthalmology;  Laterality: Right;  [place]  00:44 AP% 16.1 CDE 7.23 Fluid pack lot # [id number]  . CHOLECYSTECTOMY       Medications Prior to Admission: Prior to Admission medications   Medication Sig Start Date End Date Taking? Authorizing Provider  acetaminophen (TYLENOL) 500 MG tablet Take 500 mg by mouth every 6 (six) hours as needed for moderate pain or headache.    [provider]  amoxicillin (AMOXIL) 500 MG capsule Take 500 mg by mouth daily.  07/01/19   [provider]  aspirin 81 MG tablet Take 81 mg by mouth daily.    [provider]  escitalopram (LEXAPRO) 10 MG tablet Take 15 mg by mouth daily.     [provider]  ezetimibe (ZETIA) 10 MG tablet Take 1 tablet by mouth once daily 06/02/19   Minna Merritts, MD  furosemide (LASIX) 20 MG tablet Take 1 tablet by mouth twice daily 07/06/19   Minna Merritts, MD  potassium chloride (MICRO-K) 10 MEQ CR capsule Take 10 mEq by mouth 2 (two) times daily.  07/21/15   Minna Merritts, MD  rosuvastatin (CRESTOR) 5 MG tablet Take 1 tablet (5 mg total) by mouth daily. 05/06/19 08/26/19  Minna Merritts, MD     Allergies:    Allergies  Allergen Reactions  . Statins Other (See Comments)    Muscle weakness    Social History:   Social History   Socioeconomic History  . Marital status: Married    Spouse name: Not on file  . Number of children: 4  . Years of education: Not on file  . Highest education level: Not on file  Occupational History  . Occupation: retired  Tobacco Use  . Smoking status: Never Smoker  .  Smokeless tobacco: Never Used  Vaping Use  . Vaping Use: Never used  Substance and Sexual Activity  . Alcohol use: Not Currently  . Drug use: No  . Sexual activity: Not on file  Other Topics Concern  . Not on file  Social History Narrative  . Not on file   Social Determinants of Health   Financial Resource Strain:   . Difficulty of Paying Living Expenses:   Food Insecurity:   . Worried About Charity fundraiser in the Last Year:   . Arboriculturist in the Last Year:   Transportation Needs:   . Film/video editor (Medical):   Marland Kitchen Lack of Transportation (Non-Medical):   Physical Activity:   . Days of Exercise per Week:   . Minutes of Exercise per Session:   Stress:   . Feeling of Stress :  Social Connections:   . Frequency of Communication with Friends and Family:   . Frequency of Social Gatherings with Friends and Family:   . Attends Religious Services:   . Active Member of Clubs or Organizations:   . Attends Archivist Meetings:   Marland Kitchen Marital Status:   Intimate Partner Violence:   . Fear of Current or Ex-Partner:   . Emotionally Abused:   Marland Kitchen Physically Abused:   . Sexually Abused:     Family History:   The patient's family history includes Emphysema in her mother; Heart attack in her father; Heart disease in her mother.    ROS:  Please see the history of present illness.  All other ROS reviewed and negative unless otherwise indicated in HPI.     Physical Exam/Data:  There were no vitals filed for this visit. No intake or output data in the 24 hours ending 08/27/19 1233 Last 3 Weights 08/25/2019 06/24/2019 06/22/2019  Weight (lbs) 180 lb 185 lb 175 lb  Weight (kg) 81.647 kg 83.915 kg 79.379 kg     There is no height or weight on file to calculate BMI.  Please defer to MD exam from today.   EKG:  No new tracings. Previous EKG with NSR without acute ST/T changes.   Relevant CV Studies:  LHC 08/27/19  Mid LM to Dist LM lesion is 70% stenosed.  Ost LAD  to Prox LAD lesion is 80% stenosed.  Prox Cx to Mid Cx lesion is 70% stenosed.  Prox RCA to Mid RCA lesion is 99% stenosed.  2nd Mrg lesion is 60% stenosed.  Mid LAD lesion is 50% stenosed.  The left ventricular ejection fraction is 45-50% by visual estimate.  There is no mitral valve regurgitation.  LV end diastolic pressure is mildly elevated.  There is mild left ventricular systolic dysfunction. Final Conclusions:   Three-vessel coronary disease There is distal left main, ostial LAD, mid left circumflex, mid RCA disease, ostial/proximal OM disease Mildly depressed ejection fraction 45 to 50% with inferior wall hypokinesis Recommendations:  Case discussed with interventional cardiology, Patient unable to make a decision whether she would prefer CABG versus stenting Decision made to transfer to [facility] for further consideration of high risk stenting, would likely need multiprocedure versus CABG which may give her the best long-lasting result given multivessel disease especially in light of previous medication intolerances -The above was discussed with family, they are in agreement Ida Rogue 08/27/2019, 11:17 AM  [facility] Resting               Left Heart  Left Ventricle The left ventricular size is normal. There is mild left ventricular systolic dysfunction. LV end diastolic pressure is mildly elevated. The left ventricular ejection fraction is 45-50% by visual estimate. There are LV function abnormalities due to segmental dysfunction. There is no evidence of mitral regurgitation.  Coronary Diagrams  Diagnostic Dominance: Right       Echo 08/26/19 1. Left ventricular ejection fraction, by estimation, is 60 to 65%. The  left ventricle has normal function. The left ventricle has no regional  wall motion abnormalities. Left ventricular diastolic function could not  be evaluated.  2. Right ventricular systolic function is normal. The right ventricular  size is  normal.  3. The mitral valve is normal in structure. Trivial mitral valve  regurgitation. No evidence of mitral stenosis.  4. The aortic valve is normal in structure. Aortic valve regurgitation is  not visualized. No aortic stenosis is present.  5. The inferior vena cava is normal in size with greater than 50%  respiratory variability, suggesting right atrial pressure of 3 mmHg.    Laboratory Data:  High Sensitivity Troponin:   Recent Labs  Lab 08/25/19 2122 08/25/19 2333 08/26/19 0900 08/26/19 1202  TROPONINIHS 70* 1,155* 8,960* 6,211*      Cardiac EnzymesNo results for input(s): TROPONINI in the last 168 hours. No results for input(s): TROPIPOC in the last 168 hours.  Chemistry Recent Labs  Lab 08/25/19 2122 08/27/19 0051  NA 142 138  K 4.1 4.3  CL 103 103  CO2 24 28  GLUCOSE 111* 107*  BUN 25* 30*  CREATININE 1.06* 0.99  CALCIUM 9.9 9.1  GFRNONAA 50* 54*  GFRAA 57* >60  ANIONGAP 15 7    No results for input(s): PROT, ALBUMIN, AST, ALT, ALKPHOS, BILITOT in the last 168 hours. Hematology Recent Labs  Lab 08/26/19 1654 08/27/19 0051  WBC 8.7 8.0  RBC 4.31 4.01  HGB 13.6 12.4  HCT 39.3 38.0  MCV 91.2 94.8  MCH 31.6 30.9  MCHC 34.6 32.6  RDW 13.6 13.4  PLT 203 185   BNPNo results for input(s): BNP, PROBNP in the last 168 hours.  DDimer No results for input(s): DDIMER in the last 168 hours.   Radiology/Studies:  CARDIAC CATHETERIZATION  Result Date: 08/27/2019  Mid LM to Dist LM lesion is 70% stenosed.  Ost LAD to Prox LAD lesion is 80% stenosed.  Prox Cx to Mid Cx lesion is 70% stenosed.  Prox RCA to Mid RCA lesion is 99% stenosed.  2nd Mrg lesion is 60% stenosed.  Mid LAD lesion is 50% stenosed.  The left ventricular ejection fraction is 45-50% by visual estimate.  There is no mitral valve regurgitation.  LV end diastolic pressure is mildly elevated.  There is mild left ventricular systolic dysfunction.     Assessment and Plan:   NSTEMI  Multivessel CAD --No current chest pain. S/p LHC as above with diffuse 3v dz - distal LM, ostial LAD, mLCx, mRCA, ostial / proximal OM.  --Echo above with EF 45-50% with inferior wall hypokinesis and cath showing mild elevation of LVEDP.  --Case discussed with interventional cardiology with decision to transfer to [facility] given high risk stenting with multiple stents versus CABG. Also considered were her multiple medication intolerances.  --Patient and family agreeable to transfer. [facility] contacted.  --Started on heart healthy diet.  --NPO after midnight in preparation for CVTS evaluation / consultation for possible CABG.  --IV heparin without bolus at 6PM.  --Daily CBC, BMET. --Continue medical management with ASA, ARB (start tomorrow),  BB, PRN nitro for CP, retrial of low dose Crestor, and Zetia. Risk factor modification with recommendations for HLD as below.  HFrEF (45-50%) --DOE stable at baseline with SOB improved as above.   --Reduced EF 45-50% with elevated LVEDP by cath.  --Defer lasix at this time given contrast exposure today. --Start low dose ARB losartan 12.5mg  daily tomorrow if renal function and BP allow / as tolerated. Recommend start tomorrow, rather than today, and due to contrast exposure from [facility].  HLD --Retrial currently underway with Crestor 5mg  daily, in addition to Zetia.  If LDL still elevated in 6-8 weeks from start of retrial, and if unable to escalate statin to control LDL due to intolerance, consider referral to lipid clinic and/or our pharmacy for additional recommendations, including [id number].   Pulmonary nodule --CT chest 5/2 with 4 mm left upper lobe nodule.  She has been  referred to the pulmonary nodule clinic.  No previous history of smoking or exposure to toxic chemicals.  Aortic atherosclerosis --As noted on previous ED.  Continue ASA, Crestor 5mg  re-trial, and Zetia. Control of LDL as above.   Severity of Illness: The appropriate patient status  for this patient is INPATIENT. Inpatient status is judged to be reasonable and necessary in order to provide the required intensity of service to ensure the patient's safety. The patient's presenting symptoms, physical exam findings, and initial radiographic and laboratory data in the context of their chronic comorbidities is felt to place them at high risk for further clinical deterioration. Furthermore, it is not anticipated that the patient will be medically stable for discharge from the hospital within 2 midnights of admission. The following factors support the patient status of inpatient.   " The patient's presenting symptoms include multivessel CAD. " The worrisome physical exam findings include 3v CAD. " The initial radiographic and laboratory data are worrisome because of CAD, NSTEMI, HFrEF " The chronic co-morbidities include CAD, NSTEMI, HFrEF, HLD.   * I certify that at the point of admission it is my clinical judgment that the patient will require inpatient hospital care spanning beyond 2 midnights from the point of admission due to high intensity of service, high risk for further deterioration and high frequency of surveillance required.*    For questions or updates, please contact [place] Please consult [email or web address] for contact info under        Signed, Arvil Chaco, PA-C  08/27/2019 12:33 PM

## 2019-08-27 NOTE — Progress Notes (Signed)
Report given to casey, RN receiving pt at [facility].

## 2019-08-28 ENCOUNTER — Other Ambulatory Visit: Payer: Self-pay | Admitting: *Deleted

## 2019-08-28 DIAGNOSIS — I2510 Atherosclerotic heart disease of native coronary artery without angina pectoris: Secondary | ICD-10-CM

## 2019-08-28 DIAGNOSIS — E785 Hyperlipidemia, unspecified: Secondary | ICD-10-CM

## 2019-08-28 DIAGNOSIS — I25110 Atherosclerotic heart disease of native coronary artery with unstable angina pectoris: Secondary | ICD-10-CM

## 2019-08-28 LAB — CBC
HCT: 37.1 % (ref 36.0–46.0)
Hemoglobin: 11.8 g/dL — ABNORMAL LOW (ref 12.0–15.0)
MCH: 30.6 pg (ref 26.0–34.0)
MCHC: 31.8 g/dL (ref 30.0–36.0)
MCV: 96.1 fL (ref 80.0–100.0)
Platelets: 163 10*3/uL (ref 150–400)
RBC: 3.86 MIL/uL — ABNORMAL LOW (ref 3.87–5.11)
RDW: 13.5 % (ref 11.5–15.5)
WBC: 8 10*3/uL (ref 4.0–10.5)
nRBC: 0 % (ref 0.0–0.2)

## 2019-08-28 LAB — TSH: TSH: 3.269 u[IU]/mL (ref 0.350–4.500)

## 2019-08-28 LAB — BASIC METABOLIC PANEL
Anion gap: 6 (ref 5–15)
BUN: 15 mg/dL (ref 8–23)
CO2: 26 mmol/L (ref 22–32)
Calcium: 9 mg/dL (ref 8.9–10.3)
Chloride: 107 mmol/L (ref 98–111)
Creatinine, Ser: 0.74 mg/dL (ref 0.44–1.00)
GFR calc Af Amer: >60 mL/min (ref >60)
GFR calc non Af Amer: >60 mL/min (ref >60)
Glucose, Bld: 96 mg/dL (ref 70–99)
Potassium: 3.8 mmol/L (ref 3.5–5.1)
Sodium: 139 mmol/L (ref 135–145)

## 2019-08-28 LAB — HEPARIN LEVEL (UNFRACTIONATED)
Heparin Unfractionated: 0.36 IU/mL (ref 0.30–0.70)
Heparin Unfractionated: 0.49 IU/mL (ref 0.30–0.70)

## 2019-08-28 MED ORDER — LEVOTHYROXINE SODIUM 50 MCG PO TABS
50.0000 ug | ORAL_TABLET | Freq: Every day | ORAL | Status: DC
  Administered 2019-08-28 – 2019-09-11 (×15): 50 ug via ORAL
  Filled 2019-08-28 (×15): qty 1

## 2019-08-28 MED ORDER — ESCITALOPRAM OXALATE 10 MG PO TABS
10.0000 mg | ORAL_TABLET | Freq: Every day | ORAL | Status: DC
  Administered 2019-08-28 – 2019-09-03 (×7): 10 mg via ORAL
  Filled 2019-08-28 (×7): qty 1

## 2019-08-28 NOTE — Progress Notes (Signed)
ANTICOAGULATION CONSULT NOTE -   Pharmacy Consult for Heparin  Indication: chest pain/ACS  Allergies  Allergen Reactions  . Statins Other (See Comments)    Muscle weakness   Patient Measurements: Weight: 82.6 kg (182 lb)    Vital Signs: Temp: 98.7 F (37.1 C) (07/09 0711) Temp Source: Oral (07/09 0711) BP: 112/50 (07/09 1041) Pulse Rate: 58 (07/09 1041)  Labs: Recent Labs    08/25/19 2122 08/25/19 2122 08/25/19 2333 08/26/19 0109 08/26/19 0900 08/26/19 1202 08/26/19 1654 08/26/19 1654 08/27/19 0051 08/28/19 0228 08/28/19 1227  HGB 14.0   < >  --   --   --   --  13.6   < > 12.4 11.8*  --   HCT 42.5   < >  --   --   --   --  39.3  --  38.0 37.1  --   PLT 227   < >  --   --   --   --  203  --  185 163  --   APTT  --   --   --  32  --   --   --   --   --   --   --   LABPROT  --   --   --  12.9  --   --   --   --   --   --   --   INR  --   --   --  1.0  --   --   --   --   --   --   --   HEPARINUNFRC  --    < >  --   --  0.99*  --  0.68   < > 0.15* 0.36 0.49  CREATININE 1.06*  --   --   --   --   --   --   --  0.99 0.74  --   TROPONINIHS 70*   < > 1,155*  --  8,960* 6,211*  --   --   --   --   --    < > = values in this interval not displayed.    Estimated Creatinine Clearance: 55.9 mL/min (by C-G formula based on SCr of 0.74 mg/dL).   Medical History: Past Medical History:  Diagnosis Date  . Anxiety   . Arthritis   . Cancer (HCC)    UTERINE  . Coronary artery disease   . Depression   . Dyspnea   . Heart murmur   . HOH (hard of hearing)   . Hyperlipidemia   . Hypothyroidism   . Non-STEMI (non-ST elevated myocardial infarction) [facility])     Assessment: Pharmacy consulted for heparin infusion dosing and monitoring for 79 yo female for ACS/STEMI.  No anticoagulants PTA.  Heparin level at goal this morning.  Awaiting decision on CABG vs. High-risk PCI.  Goal of Therapy:  Heparin level 0.3-0.7 units/ml Monitor platelets by anticoagulation protocol: Yes    Plan:  Continue IV heparin at current rate. Daily heparin level and CBC. F/u plans for CABG vs. PCI.  [place] Crane, Roylene Reason, BCCP Clinical Pharmacist  08/28/2019 2:05 PM   [facility] pharmacy phone numbers are listed on [email or web address]

## 2019-08-28 NOTE — Progress Notes (Signed)
Progress Note  Patient Name: Bonnie Hancock Date of Encounter: 08/28/2019  [facility] Cardiologist: Ida Rogue, MD   Subjective   Feeling well. No chest pain, sob or palpitations.   Inpatient Medications    Scheduled Meds: . aspirin EC  81 mg Oral Daily  . ezetimibe  10 mg Oral QHS  . losartan  12.5 mg Oral Daily  . metoprolol tartrate  12.5 mg Oral BID  . rosuvastatin  5 mg Oral q1800   Continuous Infusions: . heparin 1,000 Units/hr (08/27/19 1739)   PRN Meds: acetaminophen   Vital Signs    Vitals:   08/27/19 2143 08/28/19 0033 08/28/19 0036 08/28/19 0711  BP:  (!) 110/45 (!) 105/47 125/60  Pulse: 60 (!) 57 (!) 57 63  Resp:  20    Temp:  99.3 F (37.4 C)  98.7 F (37.1 C)  TempSrc:  Oral  Oral  SpO2:  96%  97%  Weight:    82.6 kg    Intake/Output Summary (Last 24 hours) at 08/28/2019 0844 Last data filed at 08/28/2019 0640 Gross per 24 hour  Intake 243.42 ml  Output --  Net 243.42 ml   Last 3 Weights 08/28/2019 08/25/2019 06/24/2019  Weight (lbs) 182 lb 180 lb 185 lb  Weight (kg) 82.555 kg 81.647 kg 83.915 kg      Telemetry    Sinus bradycardia in 50s- Personally Reviewed  ECG    Sinus bradycardia at 58 bpm- Personally Reviewed  Physical Exam   GEN: No acute distress.   Neck: No JVD Cardiac: RRR, no murmurs, rubs, or gallops. Right groin cath site without hematoma Respiratory: Clear to auscultation bilaterally. GI: Soft, nontender, non-distended  MS: No edema; No deformity. Neuro:  Nonfocal  Psych: Normal affect   Labs    High Sensitivity Troponin:   Recent Labs  Lab 08/25/19 2122 08/25/19 2333 08/26/19 0900 08/26/19 1202  TROPONINIHS 70* 1,155* 8,960* 6,211*      Chemistry Recent Labs  Lab 08/25/19 2122 08/27/19 0051 08/28/19 0228  NA 142 138 139  K 4.1 4.3 3.8  CL 103 103 107  CO2 24 28 26   GLUCOSE 111* 107* 96  BUN 25* 30* 15  CREATININE 1.06* 0.99 0.74  CALCIUM 9.9 9.1 9.0  GFRNONAA 50* 54* >60  GFRAA 57* >60 >60   ANIONGAP 15 7 6      Hematology Recent Labs  Lab 08/26/19 1654 08/27/19 0051 08/28/19 0228  WBC 8.7 8.0 8.0  RBC 4.31 4.01 3.86*  HGB 13.6 12.4 11.8*  HCT 39.3 38.0 37.1  MCV 91.2 94.8 96.1  MCH 31.6 30.9 30.6  MCHC 34.6 32.6 31.8  RDW 13.6 13.4 13.5  PLT 203 185 163    Radiology    CARDIAC CATHETERIZATION  Result Date: 08/27/2019  Mid LM to Dist LM lesion is 70% stenosed.  Ost LAD to Prox LAD lesion is 80% stenosed.  Prox Cx to Mid Cx lesion is 70% stenosed.  Prox RCA to Mid RCA lesion is 99% stenosed.  2nd Mrg lesion is 60% stenosed.  Mid LAD lesion is 50% stenosed.  The left ventricular ejection fraction is 45-50% by visual estimate.  There is no mitral valve regurgitation.  LV end diastolic pressure is mildly elevated.  There is mild left ventricular systolic dysfunction.    ECHOCARDIOGRAM COMPLETE  Result Date: 08/26/2019    ECHOCARDIOGRAM REPORT   Patient Name:   Bonnie Hancock Date of Exam: 08/26/2019 Medical Rec #:  [id number]  Height:       62.0 in Accession #:    [id number]     Weight:       180.0 lb Date of Birth:  [birth year]     BSA:          1.828 m Patient Age:    32 years       BP:           100/48 mmHg Patient Gender: F              HR:           50 bpm. Exam Location:  [facility] Procedure: 2D Echo, Cardiac Doppler and Color Doppler Indications:     Chest pain 786.50  History:         Patient has no prior history of Echocardiogram examinations.                  Signs/Symptoms:Dyspnea and Murmur; Risk Factors:Dyslipidemia.  Sonographer:     Sherrie Sport RDCS (AE) Referring Phys:  [id number] Kate Sable Diagnosing Phys: Kate Sable MD  Sonographer Comments: No apical window. IMPRESSIONS  1. Left ventricular ejection fraction, by estimation, is 60 to 65%. The left ventricle has normal function. The left ventricle has no regional wall motion abnormalities. Left ventricular diastolic function could not be evaluated.  2. Right ventricular systolic function is  normal. The right ventricular size is normal.  3. The mitral valve is normal in structure. Trivial mitral valve regurgitation. No evidence of mitral stenosis.  4. The aortic valve is normal in structure. Aortic valve regurgitation is not visualized. No aortic stenosis is present.  5. The inferior vena cava is normal in size with greater than 50% respiratory variability, suggesting right atrial pressure of 3 mmHg. FINDINGS  Left Ventricle: Left ventricular ejection fraction, by estimation, is 60 to 65%. The left ventricle has normal function. The left ventricle has no regional wall motion abnormalities. The left ventricular internal cavity size was normal in size. There is  no left ventricular hypertrophy. Left ventricular diastolic function could not be evaluated. Right Ventricle: The right ventricular size is normal. No increase in right ventricular wall thickness. Right ventricular systolic function is normal. Left Atrium: Left atrial size was normal in size. Right Atrium: Right atrial size was not well visualized. Pericardium: There is no evidence of pericardial effusion. Mitral Valve: The mitral valve is normal in structure. Normal mobility of the mitral valve leaflets. Trivial mitral valve regurgitation. No evidence of mitral valve stenosis. Tricuspid Valve: The tricuspid valve is normal in structure. Tricuspid valve regurgitation is not demonstrated. No evidence of tricuspid stenosis. Aortic Valve: The aortic valve is normal in structure. Aortic valve regurgitation is not visualized. No aortic stenosis is present. Pulmonic Valve: The pulmonic valve was not well visualized. Pulmonic valve regurgitation is not visualized. No evidence of pulmonic stenosis. Aorta: The aortic root is normal in size and structure. Venous: The inferior vena cava is normal in size with greater than 50% respiratory variability, suggesting right atrial pressure of 3 mmHg. IAS/Shunts: No atrial level shunt detected by color flow Doppler.   LEFT VENTRICLE PLAX 2D LVIDd:         4.32 cm LVIDs:         2.60 cm LV PW:         0.92 cm LV IVS:        1.03 cm LVOT diam:     2.00 cm LVOT Area:     3.14 cm  LEFT ATRIUM         Index LA diam:    3.10 cm 1.70 cm/m                        PULMONIC VALVE AORTA                 PV Vmax:        0.62 m/s Ao Root diam: 2.80 cm PV Peak grad:   1.5 mmHg                       RVOT Peak grad: 2 mmHg   SHUNTS Systemic Diam: 2.00 cm Kate Sable MD Electronically signed by Kate Sable MD Signature Date/Time: 08/26/2019/3:54:31 PM    Final     Cardiac Studies   LEFT HEART CATH AND CORONARY ANGIOGRAPHY 08/27/19  Conclusion   Mid LM to Dist LM lesion is 70% stenosed.  Ost LAD to Prox LAD lesion is 80% stenosed.  Prox Cx to Mid Cx lesion is 70% stenosed.  Prox RCA to Mid RCA lesion is 99% stenosed.  2nd Mrg lesion is 60% stenosed.  Mid LAD lesion is 50% stenosed.  The left ventricular ejection fraction is 45-50% by visual estimate.  There is no mitral valve regurgitation.  LV end diastolic pressure is mildly elevated.  There is mild left ventricular systolic dysfunction. Diagnostic Dominance: Right  Intervention   Echo 08/26/19 1. Left ventricular ejection fraction, by estimation, is 60 to 65%. The  left ventricle has normal function. The left ventricle has no regional  wall motion abnormalities. Left ventricular diastolic function could not  be evaluated.  2. Right ventricular systolic function is normal. The right ventricular  size is normal.  3. The mitral valve is normal in structure. Trivial mitral valve  regurgitation. No evidence of mitral stenosis.  4. The aortic valve is normal in structure. Aortic valve regurgitation is  not visualized. No aortic stenosis is present.  5. The inferior vena cava is normal in size with greater than 50%  respiratory variability, suggesting right atrial pressure of 3 mmHg.   Patient Profile     Bonnie Hancock is a 79 y.o. 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   Assessment & Plan    1. NSTEMI - Hs-Troponin peaked at [place]. Cath showed with diffuse 3v dz - distal LM, ostial LAD, mLCx, mRCA, ostial / proximal OM.  Here for right risk PCI vs CABG. Pending surgical evaluation.  - Continue ASA, BB, ARB and low dose statn - continue heparin - Chest pain free   2. Chronic diastolic CHF - Cath showed LVEF of 45-50% and mildly elevated LVEDP  - Echo 08/26/19 showed LVEF of 60-65%, no AM abnormality - Continue BB and ARB - She is euvolemic  3. HLD - 08/27/2019: Cholesterol 178; HDL 35; LDL Cholesterol 102; Triglycerides 206; VLDL 41  - Stain intolerance - Continue Crestor 5mg  qd and Zetia 10mg  qd - Consider PCSK9 inhibitor as outpatient   For questions or updates, please contact [facility] Please consult [email or web address] for contact info under    SignedLeanor Kail, PA  08/28/2019, 8:44 AM

## 2019-08-28 NOTE — Progress Notes (Signed)
TCTS consulted for CABG evaluation. °

## 2019-08-28 NOTE — Progress Notes (Signed)
ANTICOAGULATION CONSULT NOTE - Follow Up Consult  Pharmacy Consult for heparin Indication: NSTEMI awaiting CVTS eval  Labs: Recent Labs    08/25/19 2122 08/25/19 2122 08/25/19 2333 08/26/19 0109 08/26/19 0900 08/26/19 1202 08/26/19 1654 08/26/19 1654 08/27/19 0051 08/28/19 0228  HGB 14.0   < >  --   --   --   --  13.6   < > 12.4 11.8*  HCT 42.5   < >  --   --   --   --  39.3  --  38.0 37.1  PLT 227   < >  --   --   --   --  203  --  185 163  APTT  --   --   --  32  --   --   --   --   --   --   LABPROT  --   --   --  12.9  --   --   --   --   --   --   INR  --   --   --  1.0  --   --   --   --   --   --   HEPARINUNFRC  --    < >  --   --  0.99*  --  0.68  --  0.15* 0.36  CREATININE 1.06*  --   --   --   --   --   --   --  0.99  --   TROPONINIHS 70*   < > 1,155*  --  8,960* 6,211*  --   --   --   --    < > = values in this interval not displayed.    Assessment/Plan:  79yo female therapeutic on heparin after resumed post-cath. Will continue gtt at current rate and confirm stable with additional level.   Wynona Neat, PharmD, BCPS  08/28/2019,3:28 AM

## 2019-08-28 NOTE — Consult Note (Addendum)
[address]       [place] [address]             [phone]        Bonnie Hancock Luna Medical Record #[id number] Date of Birth: 1940/07/20  Referring:  Sanda Klein, MD Primary Care: Albina Billet, MD Primary Cardiologist:Timothy Rockey Situ, MD  Chief Complaint:   Chest pain  History of Present Illness:   Bonnie Hancock is a pleasant 79 year old female with past history of dyslipidemia, hypothyroidism, arthritis, depression, anxiety and chronic diastolic heart failure.  She has no prior history of myocardial infarction.  She was a passenger riding in a car a few days ago returning from picking up her husband from [facility] when she developed left-sided chest pain radiating to her left arm.  This was described as moderate to severe chest tightness and was associated with a headache but no shortness of breath, diaphoresis, or nausea.  She was taken immediately to the emergency room for evaluation at [facility].  Work-up included an EKG that showed sinus rhythm with a rate of 58 and nonspecific ST abnormality.  Chest x-ray was unremarkable.  Initial high-sensitivity was 70 but was elevated to 1155 a few hours later and eventually peaked at nearly 9000.  SARS coronavirus screen was negative.  Remaining laboratory data was unremarkable.  She was given aspirin and started on oral beta-blocker.  She was also started on a heparin drip. She was admitted to [facility] with acute non-ST elevation myocardial infarction.  She has not had any further chest pain since initially being managed [facility] on 08/26/2019.   The cardiology service was consulted and left heart catheterization was recommended.  The study was performed on 08/27/2019 and demonstrated severe multivessel coronary artery disease.  There is a 70% left main coronary stenosis.  There was also an 80% lesion in the proximal LAD.  There is a  70% stenosis in the mid circumflex coronary artery and a 60% stenosis in the proximal portion of OM 2. The RCA was likely the culprit lesion with a 99% mid stenosis.  Left ventriculogram suggested an ejection fraction of 40 to 45% with inferior hypokinesis.  Echocardiography was also performed and demonstrated an ejection fraction of 60 to 65%.  There was trivial mitral insufficiency but no significant mitral stenosis, aortic stenosis, or aortic insufficiency. Bonnie Hancock was transferred to [facility] for consideration of coronary bypass grafting versus multivessel PTCI.  Her coronary angiography has been reviewed at [facility] by Dr. Ercel Normoyle Martinique.  He noted:    "Disease not ideal for PCI with long segment of disease in the proximal to mid LAD, bifurcation disease in the LCx/OM1. And mid RCA stenosis. If CABG felt not to be an option could consider complex PCI of the LAD and RCA but would probably treat LCx medically."  We have been asked to evaluate Bonnie. for consideration of coronary bypass grafting for multivessel coronary artery disease presenting with acute non-ST elevation myocardial infarction. Bonnie Hancock is currently resting in bed.  Her husband and sister-in-law are at the bedside.  IV heparin is infusing.  She denies any chest pain or shortness of breath.   Current Activity/ Functional Status:   Zubrod Score: At the time of surgery this patient's most appropriate activity status/level should be described as: []     0    Normal activity, no symptoms [x]   1    Restricted in physical strenuous activity but ambulatory, able to do out light work []     2    Ambulatory and capable of self care, unable to do work activities, up and about                 more than 50%  Of the time                            []     3    Only limited self care, in bed greater than 50% of waking hours []     4    Completely disabled, no self care, confined to bed or chair []     5    Moribund  Past  Medical History:  Diagnosis Date  . Anxiety   . Arthritis   . Cancer (HCC)    UTERINE  . Coronary artery disease   . Depression   . Dyspnea   . Heart murmur   . HOH (hard of hearing)   . Hyperlipidemia   . Hypothyroidism   . Non-STEMI (non-ST elevated myocardial infarction) [facility])     Past Surgical History:  Procedure Laterality Date  . APPENDECTOMY    . CATARACT EXTRACTION W/PHACO Left 09/17/2017   Procedure: CATARACT EXTRACTION PHACO AND INTRAOCULAR LENS PLACEMENT (IOC);  Surgeon: Birder Robson, MD;  Location: [facility] ORS;  Service: Ophthalmology;  Laterality: Left;  [place] 00:48 AP% 13.0 CDE 6.26 Fluid pack lot # [id number]  . CATARACT EXTRACTION W/PHACO Right 10/15/2017   Procedure: CATARACT EXTRACTION PHACO AND INTRAOCULAR LENS PLACEMENT (IOC);  Surgeon: Birder Robson, MD;  Location: [facility] ORS;  Service: Ophthalmology;  Laterality: Right;  [place]  00:44 AP% 16.1 CDE 7.23 Fluid pack lot # [id number]  . CHOLECYSTECTOMY    . LEFT HEART CATH AND CORONARY ANGIOGRAPHY N/A 08/27/2019   Procedure: LEFT HEART CATH AND CORONARY ANGIOGRAPHY;  Surgeon: Minna Merritts, MD;  Location: [place] [facility];  Service: Cardiovascular;  Laterality: N/A;    Social History   Tobacco Use  Smoking Status Never Smoker  Smokeless Tobacco Never Used    Social History   Substance and Sexual Activity  Alcohol Use Not Currently     Allergies  Allergen Reactions  . Statins Other (See Comments)    Muscle weakness    Current Facility-Administered Medications  Medication Dose Route Frequency Provider Last Rate Last Admin  . acetaminophen (TYLENOL) tablet 650 mg  650 mg Oral Q4H PRN Marrianne Mood D, PA-C      . aspirin EC tablet 81 mg  81 mg Oral Daily Marrianne Mood D, PA-C   81 mg at 08/28/19 1043  . ezetimibe (ZETIA) tablet 10 mg  10 mg Oral QHS Visser, Jacquelyn D, PA-C   10 mg at 08/27/19 2143  . heparin ADULT infusion 100 units/mL (25000 units/229mL sodium chloride 0.45%)  1,000  Units/hr Intravenous Continuous Lyndee Leo, RPH 10 mL/hr at 08/27/19 1739 1,000 Units/hr at 08/27/19 1739  . losartan (COZAAR) tablet 12.5 mg  12.5 mg Oral Daily Marrianne Mood D, PA-C   12.5 mg at 08/28/19 1041  . metoprolol tartrate (LOPRESSOR) tablet 12.5 mg  12.5 mg Oral BID Mickle Plumb, Jacquelyn D, PA-C   12.5 mg at 08/28/19 1041  . rosuvastatin (CRESTOR) tablet 5 mg  5 mg Oral q1800 Marrianne Mood D, PA-C   5 mg at 08/27/19 1736    Medications Prior to Admission  Medication Sig  Dispense Refill Last Dose  . acetaminophen (TYLENOL) 500 MG tablet Take 500 mg by mouth every 6 (six) hours as needed for moderate pain or headache.     Marland Kitchen aspirin 81 MG tablet Take 81 mg by mouth daily.     Marland Kitchen escitalopram (LEXAPRO) 10 MG tablet Take 15 mg by mouth daily.      Marland Kitchen ezetimibe (ZETIA) 10 MG tablet Take 1 tablet by mouth once daily 90 tablet 0   . furosemide (LASIX) 20 MG tablet Take 1 tablet by mouth twice daily 180 tablet 0   . metoprolol tartrate (LOPRESSOR) 25 MG tablet Take 0.5 tablets (12.5 mg total) by mouth 2 (two) times daily.     . nitroGLYCERIN (NITROSTAT) 0.4 MG SL tablet Place 1 tablet (0.4 mg total) under the tongue every 5 (five) minutes x 3 doses as needed for chest pain.  12   . potassium chloride (MICRO-K) 10 MEQ CR capsule Take 10 mEq by mouth 2 (two) times daily.  60 capsule 3   . rosuvastatin (CRESTOR) 5 MG tablet Take 1 tablet (5 mg total) by mouth daily. 90 tablet 3     Family History  Problem Relation Age of Onset  . Heart attack Father   . Emphysema Mother   . Heart disease Mother      Review of Systems:   ROS     Cardiac Review of Systems:   Chest Pain [ Y, now resolved   ]  Resting SOB [ Y, now resolved  ] Exertional SOB  [  ]  Orthopnea [  ]   Pedal Edema [   ]    Palpitations [  ] Syncope  [  ]   Presyncope [   ]  General Review of Systems: [Y] = yes [  ]=no Constitional: recent weight change [  ]; anorexia [  ]; fatigue [  ]; nausea [  ]; night sweats [   ]; fever [  ]; or chills [  ]                                                               Dental: Last Dentist visit: 06/2019  Eye : blurred vision [  ]; diplopia [   ]; vision changes [  ];  Amaurosis fugax[  ]; Resp: cough [  ];  wheezing[  ];  hemoptysis[  ]; shortness of breath[  ]; paroxysmal nocturnal dyspnea[  ]; dyspnea on exertion[  ]; or orthopnea[  ];  GI:  gallstones[  ], vomiting[  ];  dysphagia[  ]; melena[  ];  hematochezia [  ]; heartburn[  ];   Hx of  Colonoscopy[  ]; GU: kidney stones [  ]; hematuria[  ];   dysuria [  ];  nocturia[  ];  history of     obstruction [  ]; urinary frequency [  ]             Skin: rash, swelling[  ];, hair loss[  ];  peripheral edema[  ];  or itching[  ]; Musculosketetal: myalgias[  ];  joint swelling[  ];  joint erythema[  ];  joint pain[  ];  back pain[  ];  Heme/Lymph: bruising[  ];  bleeding[  ];  anemia[  ];  Neuro: TIA[  ];  headaches[  ];  stroke[  ];  vertigo[  ];  seizures[  ];   paresthesias[  ];  difficulty walking[  ];  Psych:depression[  ]; anxiety[  ];  Endocrine: diabetes[  ];  thyroid dysfunction[y  ];              Physical Exam: BP (!) 112/50   Pulse (!) 58   Temp 98.7 F (37.1 C) (Oral)   Resp 20   Wt 82.6 kg   LMP  (LMP Unknown)   SpO2 97%   BMI 33.29 kg/m    General appearance: alert, cooperative and no distress Head: Normocephalic, without obvious abnormality, atraumatic.  She is missing several teeth in her upper and lower jaw.  The remaining teeth are in good repair.  She has plans to have the remaining teeth removed and replaced with full upper and lower dentures. Neck: no adenopathy, no carotid bruit, no JVD, supple, symmetrical, trachea midline and thyroid not enlarged, symmetric, no tenderness/mass/nodules Lymph nodes: Cervical, supraclavicular, and axillary nodes normal. and No obvious adenopathy Resp: clear to auscultation bilaterally Cardio: regular rate and rhythm, S1, S2 normal, no murmur, click, rub or  gallop GI: soft, non-tender; bowel sounds normal; no masses,  no organomegaly Extremities: extremities normal, atraumatic, no cyanosis or edema. There are superficial varicosities in both lower legs but no significant varicosities over the greater saphenous veins. All distal extremities are warm and well perfused. Both radial pulses are palpable but I am unable to palpate the DP or PT pulses in either foot.  Neurologic: Alert and oriented X 3, normal strength and tone. Normal symmetric reflexes. Normal coordination and gait  Diagnostic Studies & Laboratory data:     Recent Radiology Findings:    CARDIAC CATHETERIZATION  Conclusion   Mid LM to Dist LM lesion is 70% stenosed.  Ost LAD to Prox LAD lesion is 80% stenosed.  Prox Cx to Mid Cx lesion is 70% stenosed.  Prox RCA to Mid RCA lesion is 99% stenosed.  2nd Mrg lesion is 60% stenosed.  Mid LAD lesion is 50% stenosed.  The left ventricular ejection fraction is 45-50% by visual estimate.  There is no mitral valve regurgitation.  LV end diastolic pressure is mildly elevated.  There is mild left ventricular systolic dysfunction.   Procedural Details  Technical Details Cardiac Catheterization Procedure Note  Name: MAAYAN JENNING MRN: [id number] DOB: 1941/02/04  Procedure: Left Heart Cath, Selective Coronary Angiography, LV angiography  Indication:   79 year old woman with history of longstanding hyperlipidemia, intolerance to statins, diet-controlled diabetes, hypertension, presenting with severe anginal pain, non-STEMI,TNT 8900, presented for cardiac catheterization  Procedural details: The right groin was prepped, draped, and anesthetized with 1% lidocaine. Using modified Seldinger technique, a 5 French sheath was introduced into the right femoral artery. Standard Judkins catheters (JL 4, JR 4 and pigtail catheter) were used for coronary angiography and left ventriculography. Catheter exchanges were performed over a  guidewire. There were no immediate procedural complications. The patient was transferred to the post catheterization recovery area for further monitoring.  Moderate sedation: 1. Sedation used: 0.5 mg of Versed 25 mcg fentanyl 2. Time of administration:       10:30 AM     Time patient left for recovery 11:10 AM Total sedation time 40 minutes 3. I was Face to Face with the patient during this time: (code: [phone])   Procedural Findings:     Coronary angiography:  Coronary dominance: Right  Left  mainstem:   Large vessel that bifurcates into the LAD and left circumflex, moderate to severe calcified distal left main disease estimated 70%  Left anterior descending (LAD):   Large vessel that extends to the apical region, diagonal branch 2 of moderate size, ostial/proximal LAD 80%, heavily calcified, 50% mid LAD disease after diagonal branch  Left circumflex (LCx):  Large vessel with OM branch 2, mid vessel with 60% diffuse disease, OM 2 with 60 to 70% ostial disease  Right coronary artery (RCA):  Right dominant vessel with PL and PDA, likely culprit vessel for non-STEMI, 99% mid vessel stenosis, long region  Left ventriculography: Left ventricular systolic function is mildly depressed 45 to 50%, inferior wall hypokinesis  there is no significant mitral regurgitation , no significant aortic valve stenosis  Final Conclusions:   Three-vessel coronary disease There is distal left main, ostial LAD, mid left circumflex, mid RCA disease, ostial/proximal OM disease Mildly depressed ejection fraction 45 to 50% with inferior wall hypokinesis  Recommendations:  Case discussed with interventional cardiology, Patient unable to make a decision whether she would prefer CABG versus stenting Decision made to transfer to [facility] for further consideration of high risk stenting, would likely need multiprocedure versus CABG which may give her the best long-lasting result given multivessel disease especially in  light of previous medication intolerances -The above was discussed with family, they are in agreement  Ida Rogue 08/27/2019, 11:17 AM  Estimated blood loss <50 mL.   Wall Motion  Resting               Left Heart  Left Ventricle The left ventricular size is normal. There is mild left ventricular systolic dysfunction. LV end diastolic pressure is mildly elevated. The left ventricular ejection fraction is 45-50% by visual estimate. There are LV function abnormalities due to segmental dysfunction. There is no evidence of mitral regurgitation.  Coronary Diagrams  Diagnostic Dominance: Right       ECHOCARDIOGRAM REPORT       Patient Name:  CALLAWAY HARDIGREE Date of Exam: 08/26/2019  Medical Rec #: [id number]   Height:    62.0 in  Accession #:  [id number]   Weight:    180.0 lb  Date of Birth: Jul 12, 1940   BSA:     1.828 m  Patient Age:  16 years    BP:      100/48 mmHg  Patient Gender: F       HR:      50 bpm.  Exam Location: [facility]   Procedure: 2D Echo, Cardiac Doppler and Color Doppler   Indications:   Chest pain 786.50    History:     Patient has no prior history of Echocardiogram  examinations.          Signs/Symptoms:Dyspnea and Murmur; Risk  Factors:Dyslipidemia.    Sonographer:   Sherrie Sport RDCS (AE)  Referring Phys: [id number] Kate Sable  Diagnosing Phys: Kate Sable MD     Sonographer Comments: No apical window.  IMPRESSIONS    1. Left ventricular ejection fraction, by estimation, is 60 to 65%. The  left ventricle has normal function. The left ventricle has no regional  wall motion abnormalities. Left ventricular diastolic function could not  be evaluated.  2. Right ventricular systolic function is normal. The right ventricular  size is normal.  3. The mitral valve is normal in structure. Trivial mitral valve  regurgitation. No evidence of mitral stenosis.  4. The aortic  valve is normal in structure. Aortic valve  regurgitation is  not visualized. No aortic stenosis is present.  5. The inferior vena cava is normal in size with greater than 50%  respiratory variability, suggesting right atrial pressure of 3 mmHg.   FINDINGS  Left Ventricle: Left ventricular ejection fraction, by estimation, is 60  to 65%. The left ventricle has normal function. The left ventricle has no  regional wall motion abnormalities. The left ventricular internal cavity  size was normal in size. There is  no left ventricular hypertrophy. Left ventricular diastolic function  could not be evaluated.   Right Ventricle: The right ventricular size is normal. No increase in  right ventricular wall thickness. Right ventricular systolic function is  normal.   Left Atrium: Left atrial size was normal in size.   Right Atrium: Right atrial size was not well visualized.   Pericardium: There is no evidence of pericardial effusion.   Mitral Valve: The mitral valve is normal in structure. Normal mobility of  the mitral valve leaflets. Trivial mitral valve regurgitation. No evidence  of mitral valve stenosis.   Tricuspid Valve: The tricuspid valve is normal in structure. Tricuspid  valve regurgitation is not demonstrated. No evidence of tricuspid  stenosis.   Aortic Valve: The aortic valve is normal in structure. Aortic valve  regurgitation is not visualized. No aortic stenosis is present.   Pulmonic Valve: The pulmonic valve was not well visualized. Pulmonic valve  regurgitation is not visualized. No evidence of pulmonic stenosis.   Aorta: The aortic root is normal in size and structure.   Venous: The inferior vena cava is normal in size with greater than 50%  respiratory variability, suggesting right atrial pressure of 3 mmHg.   IAS/Shunts: No atrial level shunt detected by color flow Doppler.     LEFT VENTRICLE  PLAX 2D  LVIDd:     4.32 cm  LVIDs:     2.60 cm  LV  PW:     0.92 cm  LV IVS:    1.03 cm  LVOT diam:   2.00 cm  LVOT Area:   3.14 cm     LEFT ATRIUM     Index  LA diam:  3.10 cm 1.70 cm/m             PULMONIC VALVE  AORTA         PV Vmax:    0.62 m/s  Ao Root diam: 2.80 cm PV Peak grad:  1.5 mmHg            RVOT Peak grad: 2 mmHg       SHUNTS  Systemic Diam: 2.00 cm   Kate Sable MD  Electronically signed by Kate Sable MD  Signature Date/Time: 08/26/2019/3:54:31 PM    EXAM: CT ANGIOGRAPHY CHEST WITH CONTRAST  TECHNIQUE: Multidetector CT imaging of the chest was performed using the standard protocol during bolus administration of intravenous contrast. Multiplanar CT image reconstructions and MIPs were obtained to evaluate the vascular anatomy.  CONTRAST:  23mL OMNIPAQUE IOHEXOL 350 MG/ML SOLN  COMPARISON:  None.  FINDINGS: Cardiovascular: Satisfactory opacification of the pulmonary arteries to the segmental level. No evidence of pulmonary embolism. Normal heart size. No pericardial effusion. Thoracic aortic atherosclerosis.  Mediastinum/Nodes: No enlarged mediastinal, hilar, or axillary lymph nodes. Thyroid gland, trachea, and esophagus demonstrate no significant findings.  Lungs/Pleura: Lungs are clear. No pleural effusion or pneumothorax. 3 mm left lower lobe pulmonary nodule. 4 mm left upper lobe pulmonary nodule (image 45/series 6).  Upper Abdomen: No acute upper abdominal abnormality.  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  Musculoskeletal: No acute osseous abnormality. No aggressive osseous lesion. Anterior bridging osteophytes as can be seen with diffuse idiopathic skeletal hyperostosis. T3 vertebral body sclerotic bone lesion likely reflecting a bone island. Broad central disc osteophyte complex at T5-6.  Review of the MIP images confirms the above findings.  IMPRESSION: 1. No evidence of pulmonary  embolus. 2. Aortic Atherosclerosis (ICD10-I70.0). 3. 4 mm left upper lobe pulmonary nodule (image 45/series 6). No follow-up needed if patient is low-risk (and has no known or suspected primary neoplasm). Non-contrast chest CT can be considered in 12 months if patient is high-risk. This recommendation follows the consensus statement: Guidelines for Management of Incidental Pulmonary Nodules Detected on CT Images: From the Fleischner Society 2017; Radiology 2017; 284:228-243.   Electronically Signed   By: Kathreen Devoid   On: 06/22/2019 17:53   I have independently reviewed the above radiologic studies and discussed with the patient   Recent Lab Findings: Lab Results  Component Value Date   WBC 8.0 08/28/2019   HGB 11.8 (L) 08/28/2019   HCT 37.1 08/28/2019   PLT 163 08/28/2019   GLUCOSE 96 08/28/2019   CHOL 178 08/27/2019   TRIG 206 (H) 08/27/2019   HDL 35 (L) 08/27/2019   LDLCALC 102 (H) 08/27/2019   NA 139 08/28/2019   K 3.8 08/28/2019   CL 107 08/28/2019   CREATININE 0.74 08/28/2019   BUN 15 08/28/2019   CO2 26 08/28/2019   TSH 3.269 08/28/2019   INR 1.0 08/26/2019      Assessment / Plan:    Very pleasant 79 year old female with the above described past medical history found to have severe three-vessel coronary artery disease after being admitted for acute onset chest pain and or plan for non-ST elevation myocardial infarction.  She is currently pain-free and hemodynamically stable.  Her left ventricular function is moderately reduced by left ventriculogram yesterday but EF was 60-65% by Echo one day prior.  She does not have any significant valvular disease based on recent echo.  Her coronary angiography has been reviewed by Dr. Martinique and he does not feel her lesions are amenable to percutaneous management.  Coronary bypass grafting is her best option for coronary revascularization for myocardial preservation and control of symptoms.  She would like for [place] to proceed  with preoperative work-up in preparation for possible coronary bypass grafting next week.  She will be seen later today by Dr. Darcey Nora and plans regarding timing of surgery will follow.  Additionally, she was noted to have atherosclerotic changes in her thoracic aorta on the CTA obtained at the time of admission. There were also noted a 38mm nodule in the left upper lung lobe and a 62mm nodule in the left lower lobe.She denies any cough or hemoptysis.  Bonnie. Mathes has never smoked but has been exposed to a significant amount of second-hand smoke.    I  spent 25 minutes counseling the patient face to face.  Antony Odea, PA-C  08/28/2019 1:26 PM  Patient examined, images of coronary angiogram, echocardiogram, and CT of chest personally reviewed and discussed with patient and family. 79 year old obese but very functional female hospitalized with symptoms of unstable angina and has been found to have severe three-vessel coronary disease.  Her LV function is well-preserved and she has no significant valvular disease by echo.  Renal and pulmonary function appear to be adequate for sternotomy and cardiopulmonary bypass.  She would be an appropriate candidate for  multivessel coronary bypass grafting but at some increased risk due to her age and obesity.  I have discussed the details of CABG the patient and family including the benefits, alternatives, risks and expected postoperative hospital recovery.  We will schedule her for surgery next week with the first available opening on schedule as she will need to remain on heparin. Thank you for the consultation  Dahlia Byes MD

## 2019-08-29 LAB — CBC
HCT: 35.7 % — ABNORMAL LOW (ref 36.0–46.0)
Hemoglobin: 11.4 g/dL — ABNORMAL LOW (ref 12.0–15.0)
MCH: 31 pg (ref 26.0–34.0)
MCHC: 31.9 g/dL (ref 30.0–36.0)
MCV: 97 fL (ref 80.0–100.0)
Platelets: 165 10*3/uL (ref 150–400)
RBC: 3.68 MIL/uL — ABNORMAL LOW (ref 3.87–5.11)
RDW: 13.8 % (ref 11.5–15.5)
WBC: 8.1 10*3/uL (ref 4.0–10.5)
nRBC: 0 % (ref 0.0–0.2)

## 2019-08-29 LAB — HEPARIN LEVEL (UNFRACTIONATED): Heparin Unfractionated: 0.41 IU/mL (ref 0.30–0.70)

## 2019-08-29 NOTE — Progress Notes (Signed)
CARDIAC REHAB PHASE I  [id number] Patient declined to walk at this time. Assisted patient to bathroom and back to chair. Inititated pre-op education with patient including restrictions, sternal precautions, and mobility. Reviewed Safe Movement After Surgery handout, and [facility] surgery booklet given. Reviewed importance of IS use, but no IS available on unit at time of instruction. Instructed patient on how to view the pre-surgery video, and wrote instructions for later viewing. Pt verbalizes understanding of instructions given.  Sol Passer, MS, ACSM CEP

## 2019-08-29 NOTE — Progress Notes (Signed)
     [address]       [place] [address]             [phone]       No chest pain Scheduled for next Friday for CABG.  Bonnie Hancock

## 2019-08-29 NOTE — Progress Notes (Signed)
Progress Note  Patient Name: Bonnie Hancock Date of Encounter: 08/29/2019  [facility] Cardiologist: Ida Rogue, MD   Subjective   No complaints no angina   Inpatient Medications    Scheduled Meds: . aspirin EC  81 mg Oral Daily  . escitalopram  10 mg Oral Daily  . ezetimibe  10 mg Oral QHS  . levothyroxine  50 mcg Oral Q0600  . losartan  12.5 mg Oral Daily  . metoprolol tartrate  12.5 mg Oral BID  . rosuvastatin  5 mg Oral q1800   Continuous Infusions: . heparin 1,000 Units/hr (08/28/19 1728)   PRN Meds: acetaminophen   Vital Signs    Vitals:   08/28/19 2151 08/29/19 0409 08/29/19 0700 08/29/19 0829  BP:  (!) 107/42  (!) 125/44  Pulse: 60 60  71  Resp:  20  17  Temp:  99 F (37.2 C)  98.4 F (36.9 C)  TempSrc:  Oral  Oral  SpO2:  96%  97%  Weight:  83.1 kg    Height:   5\' 2"  (1.575 m)     Intake/Output Summary (Last 24 hours) at 08/29/2019 0904 Last data filed at 08/29/2019 0900 Gross per 24 hour  Intake 240 ml  Output --  Net 240 ml   Last 3 Weights 08/29/2019 08/28/2019 08/25/2019  Weight (lbs) 183 lb 3.2 oz 182 lb 180 lb  Weight (kg) 83.099 kg 82.555 kg 81.647 kg      Telemetry    Sinus bradycardia in 40s- Personally Reviewed  ECG    Sinus bradycardia at 58 bpm- Personally Reviewed  Physical Exam   Affect appropriate Healthy:  Appears younger than  stated age 6: normal Neck supple with no adenopathy JVP normal no bruits no thyromegaly Lungs clear with no wheezing and good diaphragmatic motion Heart:  S1/S2 no murmur, no rub, gallop or click PMI normal Abdomen: benighn, BS positve, no tenderness, no AAA no bruit.  No HSM or HJR Distal pulses intact with no bruits No edema Neuro non-focal Skin warm and dry No muscular weakness   Labs    High Sensitivity Troponin:   Recent Labs  Lab 08/25/19 2122 08/25/19 2333 08/26/19 0900 08/26/19 1202  TROPONINIHS 70* 1,155* 8,960* 6,211*      Chemistry Recent Labs  Lab  08/25/19 2122 08/27/19 0051 08/28/19 0228  NA 142 138 139  K 4.1 4.3 3.8  CL 103 103 107  CO2 24 28 26   GLUCOSE 111* 107* 96  BUN 25* 30* 15  CREATININE 1.06* 0.99 0.74  CALCIUM 9.9 9.1 9.0  GFRNONAA 50* 54* >60  GFRAA 57* >60 >60  ANIONGAP 15 7 6      Hematology Recent Labs  Lab 08/27/19 0051 08/28/19 0228 08/29/19 0332  WBC 8.0 8.0 8.1  RBC 4.01 3.86* 3.68*  HGB 12.4 11.8* 11.4*  HCT 38.0 37.1 35.7*  MCV 94.8 96.1 97.0  MCH 30.9 30.6 31.0  MCHC 32.6 31.8 31.9  RDW 13.4 13.5 13.8  PLT 185 163 165    Radiology    CARDIAC CATHETERIZATION  Result Date: 08/27/2019  Mid LM to Dist LM lesion is 70% stenosed.  Ost LAD to Prox LAD lesion is 80% stenosed.  Prox Cx to Mid Cx lesion is 70% stenosed.  Prox RCA to Mid RCA lesion is 99% stenosed.  2nd Mrg lesion is 60% stenosed.  Mid LAD lesion is 50% stenosed.  The left ventricular ejection fraction is 45-50% by visual estimate.  There is no mitral  valve regurgitation.  LV end diastolic pressure is mildly elevated.  There is mild left ventricular systolic dysfunction.     Cardiac Studies   LEFT HEART CATH AND CORONARY ANGIOGRAPHY 08/27/19  Conclusion   Mid LM to Dist LM lesion is 70% stenosed.  Ost LAD to Prox LAD lesion is 80% stenosed.  Prox Cx to Mid Cx lesion is 70% stenosed.  Prox RCA to Mid RCA lesion is 99% stenosed.  2nd Mrg lesion is 60% stenosed.  Mid LAD lesion is 50% stenosed.  The left ventricular ejection fraction is 45-50% by visual estimate.  There is no mitral valve regurgitation.  LV end diastolic pressure is mildly elevated.  There is mild left ventricular systolic dysfunction. Diagnostic Dominance: Right  Intervention   Echo 08/26/19 1. Left ventricular ejection fraction, by estimation, is 60 to 65%. The  left ventricle has normal function. The left ventricle has no regional  wall motion abnormalities. Left ventricular diastolic function could not  be evaluated.  2. Right  ventricular systolic function is normal. The right ventricular  size is normal.  3. The mitral valve is normal in structure. Trivial mitral valve  regurgitation. No evidence of mitral stenosis.  4. The aortic valve is normal in structure. Aortic valve regurgitation is  not visualized. No aortic stenosis is present.  5. The inferior vena cava is normal in size with greater than 50%  respiratory variability, suggesting right atrial pressure of 3 mmHg.   Patient Profile     [name] is a 79 y.o. 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   Assessment & Plan    1. NSTEMI - Hs-Troponin peaked at [place]. Cath showed with diffuse 3v dz - distal LM, ostial LAD, mLCx, mRCA, ostial / proximal OM.  Seen by PvT for CVTS Plan for CABG next week continue heparin   2. Chronic diastolic CHF - Cath showed LVEF of 45-50% and mildly elevated LVEDP  - Echo 08/26/19 showed LVEF of 60-65%, no AM abnormality - Continue BB and ARB - She is euvolemic  3. HLD - 08/27/2019: Cholesterol 178; HDL 35; LDL Cholesterol 102; Triglycerides 206; VLDL 41  - Stain intolerance - Continue Crestor 5mg  qd and Zetia 10mg  qd - Consider PCSK9 inhibitor as outpatient   For questions or updates, please contact [facility] Please consult [email or web address] for contact info under    Signed, Jenkins Rouge, MD  08/29/2019, 9:04 AM

## 2019-08-29 NOTE — Progress Notes (Addendum)
ANTICOAGULATION CONSULT NOTE -   Pharmacy Consult for Heparin  Indication: chest pain/ACS  Allergies  Allergen Reactions  . Statins Other (See Comments)    Muscle weakness   Patient Measurements: Height: 5\' 2"  (157.5 cm) Weight: 83.1 kg (183 lb 3.2 oz) IBW/kg (Calculated) : 50.1    Vital Signs: Temp: 99 F (37.2 C) (07/10 0409) Temp Source: Oral (07/10 0409) BP: 107/42 (07/10 0409) Pulse Rate: 60 (07/10 0409)  Labs: Recent Labs    08/26/19 0900 08/26/19 1202 08/26/19 1654 08/27/19 0051 08/27/19 0051 08/28/19 0228 08/28/19 1227 08/29/19 0332  HGB  --   --    < > 12.4   < > 11.8*  --  11.4*  HCT  --   --    < > 38.0  --  37.1  --  35.7*  PLT  --   --    < > 185  --  163  --  165  HEPARINUNFRC 0.99*  --    < > 0.15*   < > 0.36 0.49 0.41  CREATININE  --   --   --  0.99  --  0.74  --   --   TROPONINIHS 8,960* 6,211*  --   --   --   --   --   --    < > = values in this interval not displayed.    Estimated Creatinine Clearance: 56 mL/min (by C-G formula based on SCr of 0.74 mg/dL).   Medical History: Past Medical History:  Diagnosis Date  . Anxiety   . Arthritis   . Cancer (HCC)    UTERINE  . Coronary artery disease   . Depression   . Dyspnea   . Heart murmur   . HOH (hard of hearing)   . Hyperlipidemia   . Hypothyroidism   . Non-STEMI (non-ST elevated myocardial infarction) [facility])     Assessment: Pharmacy consulted for heparin infusion dosing and monitoring for 79 yo female for ACS/STEMI.  No anticoagulants PTA.  Heparin level at goal this morning.  Tentative plan is for CABG next Friday 7/16. Patient experiencing some bruising but she states it is improving.   Goal of Therapy:  Heparin level 0.3-0.7 units/ml Monitor platelets by anticoagulation protocol: Yes   Plan:  Continue IV heparin at current rate. Daily heparin level and CBC. F/u plans for CABG vs. PCI.  Rebbeca Paul, PharmD PGY1 Pharmacy Resident 08/29/2019 10:53 AM  Please check  [email or web address] for unit-specific pharmacy phone numbers.  I discussed / reviewed the pharmacy note by Dr. Lorin Mercy and I agree with the resident's findings and plans as documented.  Sloan Leiter, PharmD, BCPS, BCCCP Clinical Pharmacist Please refer to [facility] for [place] numbers 08/29/2019, 10:58 AM

## 2019-08-30 DIAGNOSIS — I214 Non-ST elevation (NSTEMI) myocardial infarction: Principal | ICD-10-CM

## 2019-08-30 LAB — CBC
HCT: 34.3 % — ABNORMAL LOW (ref 36.0–46.0)
Hemoglobin: 11 g/dL — ABNORMAL LOW (ref 12.0–15.0)
MCH: 31.3 pg (ref 26.0–34.0)
MCHC: 32.1 g/dL (ref 30.0–36.0)
MCV: 97.7 fL (ref 80.0–100.0)
Platelets: 155 10*3/uL (ref 150–400)
RBC: 3.51 MIL/uL — ABNORMAL LOW (ref 3.87–5.11)
RDW: 13.8 % (ref 11.5–15.5)
WBC: 7.8 10*3/uL (ref 4.0–10.5)
nRBC: 0 % (ref 0.0–0.2)

## 2019-08-30 LAB — HEPARIN LEVEL (UNFRACTIONATED): Heparin Unfractionated: 0.54 IU/mL (ref 0.30–0.70)

## 2019-08-30 NOTE — Progress Notes (Signed)
Progress Note  Patient Name: Bonnie Hancock Date of Encounter: 08/30/2019  [facility] Cardiologist: Ida Rogue, MD   Subjective   No complaints no angina Good BM this am   Inpatient Medications    Scheduled Meds: . aspirin EC  81 mg Oral Daily  . escitalopram  10 mg Oral Daily  . ezetimibe  10 mg Oral QHS  . levothyroxine  50 mcg Oral Q0600  . losartan  12.5 mg Oral Daily  . metoprolol tartrate  12.5 mg Oral BID  . rosuvastatin  5 mg Oral q1800   Continuous Infusions: . heparin 1,000 Units/hr (08/29/19 1804)   PRN Meds: acetaminophen   Vital Signs    Vitals:   08/29/19 0829 08/29/19 1600 08/29/19 2108 08/30/19 0421  BP: (!) 125/44 (!) 119/45 (!) 130/45   Pulse: 71 62 63 (!) 51  Resp: 17 20 20    Temp: 98.4 F (36.9 C) 98 F (36.7 C) 98.5 F (36.9 C) 98.3 F (36.8 C)  TempSrc: Oral Oral Oral Oral  SpO2: 97% 98%  98%  Weight:    83 kg  Height:        Intake/Output Summary (Last 24 hours) at 08/30/2019 0902 Last data filed at 08/30/2019 1025 Gross per 24 hour  Intake 790.17 ml  Output --  Net 790.17 ml   Last 3 Weights 08/30/2019 08/29/2019 08/28/2019  Weight (lbs) 182 lb 15.7 oz 183 lb 3.2 oz 182 lb  Weight (kg) 83 kg 83.099 kg 82.555 kg      Telemetry    Sinus bradycardia in 38s- Personally Reviewed  ECG    Sinus bradycardia at 58 bpm- Personally Reviewed  Physical Exam   Affect appropriate Healthy:  Appears younger than  stated age 49: normal Neck supple with no adenopathy JVP normal no bruits no thyromegaly Lungs clear with no wheezing and good diaphragmatic motion Heart:  S1/S2 no murmur, no rub, gallop or click PMI normal Abdomen: benighn, BS positve, no tenderness, no AAA no bruit.  No HSM or HJR Distal pulses intact with no bruits No edema Neuro non-focal Skin warm and dry No muscular weakness   Labs    High Sensitivity Troponin:   Recent Labs  Lab 08/25/19 2122 08/25/19 2333 08/26/19 0900 08/26/19 1202   TROPONINIHS 70* 1,155* 8,960* 6,211*      Chemistry Recent Labs  Lab 08/25/19 2122 08/27/19 0051 08/28/19 0228  NA 142 138 139  K 4.1 4.3 3.8  CL 103 103 107  CO2 24 28 26   GLUCOSE 111* 107* 96  BUN 25* 30* 15  CREATININE 1.06* 0.99 0.74  CALCIUM 9.9 9.1 9.0  GFRNONAA 50* 54* >60  GFRAA 57* >60 >60  ANIONGAP 15 7 6      Hematology Recent Labs  Lab 08/28/19 0228 08/29/19 0332 08/30/19 0357  WBC 8.0 8.1 7.8  RBC 3.86* 3.68* 3.51*  HGB 11.8* 11.4* 11.0*  HCT 37.1 35.7* 34.3*  MCV 96.1 97.0 97.7  MCH 30.6 31.0 31.3  MCHC 31.8 31.9 32.1  RDW 13.5 13.8 13.8  PLT 163 165 155    Radiology    No results found.  Cardiac Studies   LEFT HEART CATH AND CORONARY ANGIOGRAPHY 08/27/19  Conclusion   Mid LM to Dist LM lesion is 70% stenosed.  Ost LAD to Prox LAD lesion is 80% stenosed.  Prox Cx to Mid Cx lesion is 70% stenosed.  Prox RCA to Mid RCA lesion is 99% stenosed.  2nd Mrg lesion is 60% stenosed.  Mid LAD lesion is 50% stenosed.  The left ventricular ejection fraction is 45-50% by visual estimate.  There is no mitral valve regurgitation.  LV end diastolic pressure is mildly elevated.  There is mild left ventricular systolic dysfunction. Diagnostic Dominance: Right  Intervention   Echo 08/26/19 1. Left ventricular ejection fraction, by estimation, is 60 to 65%. The  left ventricle has normal function. The left ventricle has no regional  wall motion abnormalities. Left ventricular diastolic function could not  be evaluated.  2. Right ventricular systolic function is normal. The right ventricular  size is normal.  3. The mitral valve is normal in structure. Trivial mitral valve  regurgitation. No evidence of mitral stenosis.  4. The aortic valve is normal in structure. Aortic valve regurgitation is  not visualized. No aortic stenosis is present.  5. The inferior vena cava is normal in size with greater than 50%  respiratory variability,  suggesting right atrial pressure of 3 mmHg.   Patient Profile     Bonnie Hancock is a 79 y.o. female with hx of chronic diastolic CHF, obesity, HLD, hypothyroidism, pulmonary nodules,  arthritisand NSTEMI with 08/27/19 LHC showing 3v CAD who  transferred to [facility] for evaluation by CVTS for CABG versus stenting (multiple).   Assessment & Plan    1. NSTEMI - Hs-Troponin peaked at [place]. Cath showed with diffuse 3v dz - distal LM, ostial LAD, mLCx, mRCA, ostial / proximal OM.  Seen by PvT for CVTS Plan for CABG next week continue heparin   2. Chronic diastolic CHF - Cath showed LVEF of 45-50% and mildly elevated LVEDP  - Echo 08/26/19 showed LVEF of 60-65%, no AM abnormality - Continue BB and ARB - She is euvolemic  3. HLD - 08/27/2019: Cholesterol 178; HDL 35; LDL Cholesterol 102; Triglycerides 206; VLDL 41  - Continue Crestor 5mg  qd and Zetia 10mg  qd - Consider PCSK9 inhibitor as outpatient   For questions or updates, please contact [facility] Please consult [email or web address] for contact info under    Signed, Jenkins Rouge, MD  08/30/2019, 9:02 AM

## 2019-08-30 NOTE — Progress Notes (Signed)
ANTICOAGULATION CONSULT NOTE -   Pharmacy Consult for Heparin  Indication: chest pain/ACS  Allergies  Allergen Reactions  . Statins Other (See Comments)    Muscle weakness   Patient Measurements: Height: 5\' 2"  (157.5 cm) Weight: 83 kg (182 lb 15.7 oz) IBW/kg (Calculated) : 50.1    Vital Signs: Temp: 98.3 F (36.8 C) (07/11 0421) Temp Source: Oral (07/11 0421) BP: 130/45 (07/10 2108) Pulse Rate: 51 (07/11 0421)  Labs: Recent Labs    08/28/19 0228 08/28/19 0228 08/28/19 1227 08/29/19 0332 08/30/19 0357  HGB 11.8*   < >  --  11.4* 11.0*  HCT 37.1  --   --  35.7* 34.3*  PLT 163  --   --  165 155  HEPARINUNFRC 0.36   < > 0.49 0.41 0.54  CREATININE 0.74  --   --   --   --    < > = values in this interval not displayed.    Estimated Creatinine Clearance: 56 mL/min (by C-G formula based on SCr of 0.74 mg/dL).   Medical History: Past Medical History:  Diagnosis Date  . Anxiety   . Arthritis   . Cancer (HCC)    UTERINE  . Coronary artery disease   . Depression   . Dyspnea   . Heart murmur   . HOH (hard of hearing)   . Hyperlipidemia   . Hypothyroidism   . Non-STEMI (non-ST elevated myocardial infarction) [facility])     Assessment: Pharmacy consulted for heparin infusion dosing and monitoring for 79 yo female for ACS/STEMI.  No anticoagulants PTA.  Heparin level at goal this morning and stable on 1000 units/hr.   Tentative plan is for CABG next Friday 7/16.  Noted bruising that per patient on 7/10 was improving.  CBC is stable. No overt bleeding noted.   Goal of Therapy:  Heparin level 0.3-0.7 units/ml Monitor platelets by anticoagulation protocol: Yes   Plan:  Continue IV heparin at current rate. Daily heparin level and CBC. F/u plans for CABG vs. PCI.  Sloan Leiter, PharmD, BCPS, BCCCP Clinical Pharmacist Please refer to [facility] for [place] numbers 08/30/2019, 8:15 AM

## 2019-08-31 LAB — BASIC METABOLIC PANEL
Anion gap: 9 (ref 5–15)
BUN: 11 mg/dL (ref 8–23)
CO2: 23 mmol/L (ref 22–32)
Calcium: 9.1 mg/dL (ref 8.9–10.3)
Chloride: 108 mmol/L (ref 98–111)
Creatinine, Ser: 0.83 mg/dL (ref 0.44–1.00)
GFR calc Af Amer: >60 mL/min (ref >60)
GFR calc non Af Amer: >60 mL/min (ref >60)
Glucose, Bld: 98 mg/dL (ref 70–99)
Potassium: 4 mmol/L (ref 3.5–5.1)
Sodium: 140 mmol/L (ref 135–145)

## 2019-08-31 LAB — CBC
HCT: 34 % — ABNORMAL LOW (ref 36.0–46.0)
Hemoglobin: 10.6 g/dL — ABNORMAL LOW (ref 12.0–15.0)
MCH: 30.5 pg (ref 26.0–34.0)
MCHC: 31.2 g/dL (ref 30.0–36.0)
MCV: 97.7 fL (ref 80.0–100.0)
Platelets: 139 10*3/uL — ABNORMAL LOW (ref 150–400)
RBC: 3.48 MIL/uL — ABNORMAL LOW (ref 3.87–5.11)
RDW: 13.9 % (ref 11.5–15.5)
WBC: 7.3 10*3/uL (ref 4.0–10.5)
nRBC: 0 % (ref 0.0–0.2)

## 2019-08-31 LAB — HEPARIN LEVEL (UNFRACTIONATED): Heparin Unfractionated: 0.52 IU/mL (ref 0.30–0.70)

## 2019-08-31 NOTE — Progress Notes (Signed)
Progress Note  Patient Name: Bonnie Hancock Date of Encounter: 08/31/2019  [facility] Cardiologist: Ida Rogue, MD   Subjective   No angina Upset at nurses talking to her abruptly about being up out of bed   Inpatient Medications    Scheduled Meds: . aspirin EC  81 mg Oral Daily  . escitalopram  10 mg Oral Daily  . ezetimibe  10 mg Oral QHS  . levothyroxine  50 mcg Oral Q0600  . losartan  12.5 mg Oral Daily  . metoprolol tartrate  12.5 mg Oral BID  . rosuvastatin  5 mg Oral q1800   Continuous Infusions: . heparin 1,000 Units/hr (08/30/19 1925)   PRN Meds: acetaminophen   Vital Signs    Vitals:   08/30/19 1403 08/30/19 1944 08/30/19 2114 08/31/19 0336  BP: 121/71 (!) 130/97  128/87  Pulse: 61 64 62 80  Resp: 13     Temp: 98.5 F (36.9 C) 98.4 F (36.9 C)  98 F (36.7 C)  TempSrc: Oral Oral  Oral  SpO2: 96% 98%  98%  Weight:    83.6 kg  Height:        Intake/Output Summary (Last 24 hours) at 08/31/2019 0808 Last data filed at 08/31/2019 3557 Gross per 24 hour  Intake 240 ml  Output --  Net 240 ml   Last 3 Weights 08/31/2019 08/30/2019 08/29/2019  Weight (lbs) 184 lb 4.8 oz 182 lb 15.7 oz 183 lb 3.2 oz  Weight (kg) 83.598 kg 83 kg 83.099 kg      Telemetry    Sinus bradycardia in 50s- Personally Reviewed  ECG    Sinus bradycardia at 58 bpm- Personally Reviewed  Physical Exam   Affect appropriate Healthy:  Appears younger than  stated age 30: normal Neck supple with no adenopathy JVP normal no bruits no thyromegaly Lungs clear with no wheezing and good diaphragmatic motion Heart:  S1/S2 no murmur, no rub, gallop or click PMI normal Abdomen: benighn, BS positve, no tenderness, no AAA no bruit.  No HSM or HJR Distal pulses intact with no bruits No edema Neuro non-focal Skin warm and dry No muscular weakness   Labs    High Sensitivity Troponin:   Recent Labs  Lab 08/25/19 2122 08/25/19 2333 08/26/19 0900 08/26/19 1202   TROPONINIHS 70* 1,155* 8,960* 6,211*      Chemistry Recent Labs  Lab 08/27/19 0051 08/28/19 0228 08/31/19 0331  NA 138 139 140  K 4.3 3.8 4.0  CL 103 107 108  CO2 28 26 23   GLUCOSE 107* 96 98  BUN 30* 15 11  CREATININE 0.99 0.74 0.83  CALCIUM 9.1 9.0 9.1  GFRNONAA 54* >60 >60  GFRAA >60 >60 >60  ANIONGAP 7 6 9      Hematology Recent Labs  Lab 08/29/19 0332 08/30/19 0357 08/31/19 0331  WBC 8.1 7.8 7.3  RBC 3.68* 3.51* 3.48*  HGB 11.4* 11.0* 10.6*  HCT 35.7* 34.3* 34.0*  MCV 97.0 97.7 97.7  MCH 31.0 31.3 30.5  MCHC 31.9 32.1 31.2  RDW 13.8 13.8 13.9  PLT 165 155 139*    Radiology    No results found.  Cardiac Studies   LEFT HEART CATH AND CORONARY ANGIOGRAPHY 08/27/19  Conclusion   Mid LM to Dist LM lesion is 70% stenosed.  Ost LAD to Prox LAD lesion is 80% stenosed.  Prox Cx to Mid Cx lesion is 70% stenosed.  Prox RCA to Mid RCA lesion is 99% stenosed.  2nd Mrg lesion  is 60% stenosed.  Mid LAD lesion is 50% stenosed.  The left ventricular ejection fraction is 45-50% by visual estimate.  There is no mitral valve regurgitation.  LV end diastolic pressure is mildly elevated.  There is mild left ventricular systolic dysfunction. Diagnostic Dominance: Right  Intervention   Echo 08/26/19 1. Left ventricular ejection fraction, by estimation, is 60 to 65%. The  left ventricle has normal function. The left ventricle has no regional  wall motion abnormalities. Left ventricular diastolic function could not  be evaluated.  2. Right ventricular systolic function is normal. The right ventricular  size is normal.  3. The mitral valve is normal in structure. Trivial mitral valve  regurgitation. No evidence of mitral stenosis.  4. The aortic valve is normal in structure. Aortic valve regurgitation is  not visualized. No aortic stenosis is present.  5. The inferior vena cava is normal in size with greater than 50%  respiratory variability,  suggesting right atrial pressure of 3 mmHg.   Patient Profile     Bonnie Hancock is a 79 y.o. female with hx of chronic diastolic CHF, obesity, HLD, hypothyroidism, pulmonary nodules,  arthritisand NSTEMI with 08/27/19 LHC showing 3v CAD who  transferred [facility] for evaluation by CVTS for CABG versus stenting (multiple).   Assessment & Plan    1. NSTEMI - Hs-Troponin peaked at [place]. Cath showed with diffuse 3v dz - distal LM, ostial LAD, mLCx, mRCA, ostial / proximal OM.  Seen by PvT for CVTS Plan for CABG ? Wednesday / Friday   2. Chronic diastolic CHF - Cath showed LVEF of 45-50% and mildly elevated LVEDP  - Echo 08/26/19 showed LVEF of 60-65%, no AM abnormality - Continue BB and ARB - She is euvolemic  3. HLD - 08/27/2019: Cholesterol 178; HDL 35; LDL Cholesterol 102; Triglycerides 206; VLDL 41  - Continue Crestor 5mg  qd and Zetia 10mg  qd - Consider PCSK9 inhibitor as outpatient   For questions or updates, please contact [facility] [facility] Please consult [email or web address] for contact info under    Signed, Jenkins Rouge, MD  08/31/2019, 8:08 AM

## 2019-08-31 NOTE — Progress Notes (Signed)
Procedure(s) (LRB): CORONARY ARTERY BYPASS GRAFTING (CABG) (N/A) TRANSESOPHAGEAL ECHOCARDIOGRAM (TEE) (N/A) Subjective: Severe 3 vessel CAD-with unstable angina stable on iv heparin CABG this week first availability am  7-16, will move up if schedule changes  Objective: Vital signs in last 24 hours: Temp:  [98 F (36.7 C)-98.5 F (36.9 C)] 98 F (36.7 C) (07/12 0336) Pulse Rate:  [61-80] 80 (07/12 0336) Cardiac Rhythm: Sinus bradycardia (07/12 0858) Resp:  [10-13] 10 (07/12 0858) BP: (121-130)/(71-100) 122/100 (07/12 0858) SpO2:  [96 %-98 %] 98 % (07/12 0336) Weight:  [83.6 kg] 83.6 kg (07/12 0336)  Hemodynamic parameters for last 24 hours:    Intake/Output from previous day: 07/11 0701 - 07/12 0700 In: 240 [P.O.:240] Out: -  Intake/Output this shift: No intake/output data recorded.    Lab Results: Recent Labs    08/30/19 0357 08/31/19 0331  WBC 7.8 7.3  HGB 11.0* 10.6*  HCT 34.3* 34.0*  PLT 155 139*   BMET:  Recent Labs    08/31/19 0331  NA 140  K 4.0  CL 108  CO2 23  GLUCOSE 98  BUN 11  CREATININE 0.83  CALCIUM 9.1    PT/INR: No results for input(s): LABPROT, INR in the last 72 hours. ABG No results found for: PHART, HCO3, TCO2, ACIDBASEDEF, O2SAT CBG (last 3)  No results for input(s): GLUCAP in the last 72 hours.  Assessment/Plan: S/P Procedure(s) (LRB): CORONARY ARTERY BYPASS GRAFTING (CABG) (N/A) TRANSESOPHAGEAL ECHOCARDIOGRAM (TEE) (N/A) CABG 7-16 am or sooner if schedule changes   LOS: 4 days    Bonnie Hancock 08/31/2019

## 2019-08-31 NOTE — Progress Notes (Signed)
CARDIAC REHAB PHASE I   PRE:  Rate/Rhythm: 61 SR  BP:  Supine:   Sitting: 122/49  Standing:    SaO2: 97%RA  MODE:  Ambulation: 400 ft   POST:  Rate/Rhythm: 81 SR  BP:  Supine:   Sitting: 105/82  Standing:    SaO2: 96%RA 0958-1046 Pt ate breakfast and then we walked 400 ft on RA with gait belt use and pt held to IV pole. Tolerated well. Glad to be up walking. To recliner with chair alarm and call bell. Gave IS and pt demonstrated 1100 ml with assistance. Reinforced importance of IS and mobility after surgery.   Graylon Good, RN BSN  08/31/2019 10:42 AM

## 2019-08-31 NOTE — Progress Notes (Signed)
ANTICOAGULATION CONSULT NOTE -   Pharmacy Consult for Heparin  Indication: chest pain/ACS  Allergies  Allergen Reactions  . Statins Other (See Comments)    Muscle weakness   Patient Measurements: Height: 5\' 2"  (157.5 cm) Weight: 83.6 kg (184 lb 4.8 oz) IBW/kg (Calculated) : 50.1    Vital Signs: Temp: 98 F (36.7 C) (07/12 0336) Temp Source: Oral (07/12 0336) BP: 128/87 (07/12 0336) Pulse Rate: 80 (07/12 0336)  Labs: Recent Labs    08/29/19 0332 08/29/19 0332 08/30/19 0357 08/31/19 0331  HGB 11.4*   < > 11.0* 10.6*  HCT 35.7*  --  34.3* 34.0*  PLT 165  --  155 139*  HEPARINUNFRC 0.41  --  0.54 0.52  CREATININE  --   --   --  0.83   < > = values in this interval not displayed.    Estimated Creatinine Clearance: 54.2 mL/min (by C-G formula based on SCr of 0.83 mg/dL).   Medical History: Past Medical History:  Diagnosis Date  . Anxiety   . Arthritis   . Cancer (HCC)    UTERINE  . Coronary artery disease   . Depression   . Dyspnea   . Heart murmur   . HOH (hard of hearing)   . Hyperlipidemia   . Hypothyroidism   . Non-STEMI (non-ST elevated myocardial infarction) [facility])     Assessment: 13 yoF admitted with ACS found to have multivessel CAD. Pharmacy consulted for IV heparin dosing. No AC PTA.  Heparin level therapeutic, H/H stable, pltc down slightly this am. CABG tentatively planned for 7/16.  Goal of Therapy:  Heparin level 0.3-0.7 units/ml Monitor platelets by anticoagulation protocol: Yes   Plan:  -Continue heparin 1000 units/h -Daily heparin level and CBC  Arrie Senate, PharmD, BCPS Clinical Pharmacist [phone] Please check AMION for all [place] numbers 08/31/2019

## 2019-09-01 ENCOUNTER — Inpatient Hospital Stay (HOSPITAL_COMMUNITY): Payer: PPO

## 2019-09-01 ENCOUNTER — Other Ambulatory Visit (HOSPITAL_COMMUNITY): Payer: PPO

## 2019-09-01 DIAGNOSIS — Z01810 Encounter for preprocedural cardiovascular examination: Secondary | ICD-10-CM

## 2019-09-01 LAB — BLOOD GAS, ARTERIAL
Acid-base deficit: 2.5 mmol/L — ABNORMAL HIGH (ref 0.0–2.0)
Bicarbonate: 20.7 mmol/L (ref 20.0–28.0)
Drawn by: 20517
FIO2: 21
O2 Saturation: 98.4 %
Patient temperature: 37
pCO2 arterial: 29 mmHg — ABNORMAL LOW (ref 32.0–48.0)
pH, Arterial: 7.466 — ABNORMAL HIGH (ref 7.350–7.450)
pO2, Arterial: 106 mmHg (ref 83.0–108.0)

## 2019-09-01 LAB — URINALYSIS, MICROSCOPIC (REFLEX)

## 2019-09-01 LAB — URINALYSIS, ROUTINE W REFLEX MICROSCOPIC
Bilirubin Urine: NEGATIVE
Glucose, UA: NEGATIVE mg/dL
Ketones, ur: NEGATIVE mg/dL
Leukocytes,Ua: NEGATIVE
Nitrite: NEGATIVE
Protein, ur: NEGATIVE mg/dL
Specific Gravity, Urine: 1.015 (ref 1.005–1.030)
pH: 5.5 (ref 5.0–8.0)

## 2019-09-01 LAB — CBC
HCT: 34.1 % — ABNORMAL LOW (ref 36.0–46.0)
Hemoglobin: 11 g/dL — ABNORMAL LOW (ref 12.0–15.0)
MCH: 31.9 pg (ref 26.0–34.0)
MCHC: 32.3 g/dL (ref 30.0–36.0)
MCV: 98.8 fL (ref 80.0–100.0)
Platelets: 163 10*3/uL (ref 150–400)
RBC: 3.45 MIL/uL — ABNORMAL LOW (ref 3.87–5.11)
RDW: 14.1 % (ref 11.5–15.5)
WBC: 8 10*3/uL (ref 4.0–10.5)
nRBC: 0 % (ref 0.0–0.2)

## 2019-09-01 LAB — HEMOGLOBIN A1C
Hgb A1c MFr Bld: 5.9 % — ABNORMAL HIGH (ref 4.8–5.6)
Mean Plasma Glucose: 122.63 mg/dL

## 2019-09-01 LAB — PULMONARY FUNCTION TEST
FEF 25-75 Pre: 0.98 L/sec
FEF2575-%Pred-Pre: 75 %
FEV1-%Pred-Pre: 76 %
FEV1-Pre: 1.35 L
FEV1FVC-%Pred-Pre: 107 %
FEV6-%Pred-Pre: 75 %
FEV6-Pre: 1.68 L
FEV6FVC-%Pred-Pre: 104 %
FVC-%Pred-Pre: 71 %
FVC-Pre: 1.71 L
Pre FEV1/FVC ratio: 79 %
Pre FEV6/FVC Ratio: 98 %

## 2019-09-01 LAB — ABO/RH: ABO/RH(D): O POS

## 2019-09-01 LAB — PROTIME-INR
INR: 1.1 (ref 0.8–1.2)
Prothrombin Time: 14.1 seconds (ref 11.4–15.2)

## 2019-09-01 LAB — SURGICAL PCR SCREEN
MRSA, PCR: NEGATIVE
Staphylococcus aureus: POSITIVE — AB

## 2019-09-01 LAB — APTT: aPTT: 116 seconds — ABNORMAL HIGH (ref 24–36)

## 2019-09-01 LAB — HEPARIN LEVEL (UNFRACTIONATED): Heparin Unfractionated: 0.4 IU/mL (ref 0.30–0.70)

## 2019-09-01 MED ORDER — CHLORHEXIDINE GLUCONATE CLOTH 2 % EX PADS
6.0000 | MEDICATED_PAD | Freq: Every day | CUTANEOUS | Status: DC
  Administered 2019-09-02 – 2019-09-03 (×2): 6 via TOPICAL

## 2019-09-01 MED ORDER — MUPIROCIN 2 % EX OINT
1.0000 "application " | TOPICAL_OINTMENT | Freq: Two times a day (BID) | CUTANEOUS | Status: DC
  Administered 2019-09-01 – 2019-09-03 (×6): 1 via NASAL
  Filled 2019-09-01: qty 22

## 2019-09-01 NOTE — Progress Notes (Signed)
Pre cabg & lower extremity vein mapping has been completed.   Preliminary results in CV Proc.   Abram Sander 09/01/2019 2:57 PM

## 2019-09-01 NOTE — Progress Notes (Signed)
ANTICOAGULATION CONSULT NOTE -   Pharmacy Consult for Heparin  Indication: chest pain/ACS  Allergies  Allergen Reactions  . Statins Other (See Comments)    Muscle weakness   Patient Measurements: Height: 5\' 2"  (157.5 cm) Weight: 83.9 kg (185 lb) IBW/kg (Calculated) : 50.1    Vital Signs: Temp: 98.4 F (36.9 C) (07/13 0820) Temp Source: Oral (07/13 0820) BP: 107/69 (07/13 0820) Pulse Rate: 61 (07/13 0409)  Labs: Recent Labs    08/30/19 0357 08/30/19 0357 08/31/19 0331 09/01/19 0413  HGB 11.0*   < > 10.6* 11.0*  HCT 34.3*  --  34.0* 34.1*  PLT 155  --  139* 163  HEPARINUNFRC 0.54  --  0.52 0.40  CREATININE  --   --  0.83  --    < > = values in this interval not displayed.    Estimated Creatinine Clearance: 54.3 mL/min (by C-G formula based on SCr of 0.83 mg/dL).   Medical History: Past Medical History:  Diagnosis Date  . Anxiety   . Arthritis   . Cancer (HCC)    UTERINE  . Coronary artery disease   . Depression   . Dyspnea   . Heart murmur   . HOH (hard of hearing)   . Hyperlipidemia   . Hypothyroidism   . Non-STEMI (non-ST elevated myocardial infarction) [facility])     Assessment: 40 yoF admitted with ACS found to have multivessel CAD. Pharmacy consulted for IV heparin dosing. No AC PTA.  Heparin level therapeutic today at 0.4, H/H stable, pltc up slightly this am but down from admission. CABG tentatively planned for 7/16. No overt bleeding documented.   Goal of Therapy:  Heparin level 0.3-0.7 units/ml Monitor platelets by anticoagulation protocol: Yes   Plan:  -Continue heparin 1000 units/h -Daily heparin level and CBC  Claudina Lick, PharmD PGY1 Acute Care Pharmacy Resident  09/01/2019 8:48 AM  Please check [email or web address] for unit-specific pharmacy phone numbers.

## 2019-09-01 NOTE — Progress Notes (Signed)
CARDIAC REHAB PHASE I   PRE:  Rate/Rhythm: 58 SB  BP:  Supine:   Sitting: 104/61  Standing:    SaO2: 96%RA  MODE:  Ambulation: 400 ft   POST:  Rate/Rhythm: 67 SR  BP:  Supine:   Sitting: 101/41  Standing:    SaO2: 98%RA 1344-1412 Pt walked 400 ft on RA with gait belt use and asst x 1 with steady gait. No CP. Tolerated well. Back to recliner with call bell and chair alarm.    Graylon Good, RN BSN  09/01/2019 2:10 PM

## 2019-09-01 NOTE — Progress Notes (Signed)
Procedure(s) (LRB): CORONARY ARTERY BYPASS GRAFTING (CABG) (N/A) TRANSESOPHAGEAL ECHOCARDIOGRAM (TEE) (N/A) Subjective: Severe 3 v CAD with unstable angina, stable on heparin PFT adequate Pre CABG Dopplers pending LE vein mapping pending    Objective: Vital signs in last 24 hours: Temp:  [98.2 F (36.8 C)-99 F (37.2 C)] 98.4 F (36.9 C) (07/13 0820) Pulse Rate:  [60-63] 61 (07/13 0409) Cardiac Rhythm: Normal sinus rhythm (07/13 0820) Resp:  [16-18] 16 (07/13 0820) BP: (107-137)/(39-69) 107/69 (07/13 0820) SpO2:  [96 %-98 %] 98 % (07/13 0409) Weight:  [83.9 kg] 83.9 kg (07/13 0410)  Hemodynamic parameters for last 24 hours:  nsr  Intake/Output from previous day: 07/12 0701 - 07/13 0700 In: 1331.4 [P.O.:684; I.V.:647.4] Out: -  Intake/Output this shift: No intake/output data recorded.       Exam    General- alert and comfortable    Neck- no JVD, no cervical adenopathy palpable, no carotid bruit   Lungs- clear without rales, wheezes   Cor- regular rate and rhythm, no murmur , gallop   Abdomen- soft, non-tender   Extremities - warm, non-tender, minimal edema   Neuro- oriented, appropriate, no focal weakness   Lab Results: Recent Labs    08/31/19 0331 09/01/19 0413  WBC 7.3 8.0  HGB 10.6* 11.0*  HCT 34.0* 34.1*  PLT 139* 163   BMET:  Recent Labs    08/31/19 0331  NA 140  K 4.0  CL 108  CO2 23  GLUCOSE 98  BUN 11  CREATININE 0.83  CALCIUM 9.1    PT/INR: No results for input(s): LABPROT, INR in the last 72 hours. ABG No results found for: PHART, HCO3, TCO2, ACIDBASEDEF, O2SAT CBG (last 3)  No results for input(s): GLUCAP in the last 72 hours.  Assessment/Plan: S/P Procedure(s) (LRB): CORONARY ARTERY BYPASS GRAFTING (CABG) (N/A) TRANSESOPHAGEAL ECHOCARDIOGRAM (TEE) (N/A) CABG 7-16, increase risk due to age, obesity Procedure d/w patient   LOS: 5 days    Tharon Aquas Trigt III 09/01/2019

## 2019-09-01 NOTE — Progress Notes (Signed)
Progress Note  Patient Name: Bonnie Hancock Date of Encounter: 09/01/2019  [facility] Cardiologist: Ida Rogue, MD   Subjective   No angina having breakfast sitting in chair   Inpatient Medications    Scheduled Meds: . aspirin EC  81 mg Oral Daily  . Chlorhexidine Gluconate Cloth  6 each Topical Daily  . escitalopram  10 mg Oral Daily  . ezetimibe  10 mg Oral QHS  . levothyroxine  50 mcg Oral Q0600  . losartan  12.5 mg Oral Daily  . metoprolol tartrate  12.5 mg Oral BID  . mupirocin ointment  1 application Nasal BID  . rosuvastatin  5 mg Oral q1800   Continuous Infusions: . heparin 1,000 Units/hr (09/01/19 0650)   PRN Meds: acetaminophen   Vital Signs    Vitals:   08/31/19 2159 09/01/19 0409 09/01/19 0410 09/01/19 0820  BP: (!) 137/52 (!) 112/39 (!) 110/40 107/69  Pulse: 61 61    Resp:  18  16  Temp:  98.2 F (36.8 C)  98.4 F (36.9 C)  TempSrc:  Oral  Oral  SpO2:  98%    Weight:   83.9 kg   Height:        Intake/Output Summary (Last 24 hours) at 09/01/2019 0905 Last data filed at 09/01/2019 0650 Gross per 24 hour  Intake 1331.35 ml  Output --  Net 1331.35 ml   Last 3 Weights 09/01/2019 08/31/2019 08/30/2019  Weight (lbs) 185 lb 184 lb 4.8 oz 182 lb 15.7 oz  Weight (kg) 83.915 kg 83.598 kg 83 kg      Telemetry    Sinus bradycardia in 50s- Personally Reviewed  ECG    Sinus bradycardia at 58 bpm- Personally Reviewed  Physical Exam   Affect appropriate Healthy:  Appears younger than  stated age 42: normal Neck supple with no adenopathy JVP normal no bruits no thyromegaly Lungs clear with no wheezing and good diaphragmatic motion Heart:  S1/S2 no murmur, no rub, gallop or click PMI normal Abdomen: benighn, BS positve, no tenderness, no AAA no bruit.  No HSM or HJR Distal pulses intact with no bruits No edema Neuro non-focal Skin warm and dry No muscular weakness   Labs    High Sensitivity Troponin:   Recent Labs  Lab  08/25/19 2122 08/25/19 2333 08/26/19 0900 08/26/19 1202  TROPONINIHS 70* 1,155* 8,960* 6,211*      Chemistry Recent Labs  Lab 08/27/19 0051 08/28/19 0228 08/31/19 0331  NA 138 139 140  K 4.3 3.8 4.0  CL 103 107 108  CO2 28 26 23   GLUCOSE 107* 96 98  BUN 30* 15 11  CREATININE 0.99 0.74 0.83  CALCIUM 9.1 9.0 9.1  GFRNONAA 54* >60 >60  GFRAA >60 >60 >60  ANIONGAP 7 6 9      Hematology Recent Labs  Lab 08/30/19 0357 08/31/19 0331 09/01/19 0413  WBC 7.8 7.3 8.0  RBC 3.51* 3.48* 3.45*  HGB 11.0* 10.6* 11.0*  HCT 34.3* 34.0* 34.1*  MCV 97.7 97.7 98.8  MCH 31.3 30.5 31.9  MCHC 32.1 31.2 32.3  RDW 13.8 13.9 14.1  PLT 155 139* 163    Radiology    No results found.  Cardiac Studies   LEFT HEART CATH AND CORONARY ANGIOGRAPHY 08/27/19  Conclusion   Mid LM to Dist LM lesion is 70% stenosed.  Ost LAD to Prox LAD lesion is 80% stenosed.  Prox Cx to Mid Cx lesion is 70% stenosed.  Prox RCA to Mid RCA  lesion is 99% stenosed.  2nd Mrg lesion is 60% stenosed.  Mid LAD lesion is 50% stenosed.  The left ventricular ejection fraction is 45-50% by visual estimate.  There is no mitral valve regurgitation.  LV end diastolic pressure is mildly elevated.  There is mild left ventricular systolic dysfunction. Diagnostic Dominance: Right  Intervention   Echo 08/26/19 1. Left ventricular ejection fraction, by estimation, is 60 to 65%. The  left ventricle has normal function. The left ventricle has no regional  wall motion abnormalities. Left ventricular diastolic function could not  be evaluated.  2. Right ventricular systolic function is normal. The right ventricular  size is normal.  3. The mitral valve is normal in structure. Trivial mitral valve  regurgitation. No evidence of mitral stenosis.  4. The aortic valve is normal in structure. Aortic valve regurgitation is  not visualized. No aortic stenosis is present.  5. The inferior vena cava is normal in  size with greater than 50%  respiratory variability, suggesting right atrial pressure of 3 mmHg.   Patient Profile     Bonnie Hancock is a 79 y.o. female with hx of chronic diastolic CHF, obesity, HLD, hypothyroidism, pulmonary nodules,  arthritisand NSTEMI with 08/27/19 LHC showing 3v CAD who  transferred to [facility] for evaluation by CVTS for CABG versus stenting (multiple).   Assessment & Plan    1. NSTEMI - Hs-Troponin peaked at [place]. Cath showed with diffuse 3v dz - distal LM, ostial LAD, mLCx, mRCA, ostial / proximal OM.  Seen by PvT for CVTS Plan for CABG  Friday   2. Chronic diastolic CHF - Cath showed LVEF of 45-50% and mildly elevated LVEDP  - Echo 08/26/19 showed LVEF of 60-65%, no AM abnormality - Continue BB and ARB - She is euvolemic  3. HLD - 08/27/2019: Cholesterol 178; HDL 35; LDL Cholesterol 102; Triglycerides 206; VLDL 41  - Continue Crestor 5mg  qd and Zetia 10mg  qd - Consider PCSK9 inhibitor as outpatient   For questions or updates, please contact [facility] Please consult [email or web address] for contact info under    Signed, Jenkins Rouge, MD  09/01/2019, 9:05 AM

## 2019-09-01 NOTE — Progress Notes (Signed)
Made request of IV team to come and see patient.  Patient currently only has 1 IV and it is in Right wrist.  Difficult to maintian with as independent at patient is.  Had discussion with team RN to assist with plan of care and maintaining area for patient to receive prescribed treatments.

## 2019-09-01 NOTE — Care Management Important Message (Signed)
Important Message  Patient Details  Name: Bonnie Hancock MRN: [id number] Date of Birth: 1940/03/18   Medicare Important Message Given:  Yes     [name] 09/01/2019, 3:10 PM

## 2019-09-02 LAB — CBC
HCT: 33 % — ABNORMAL LOW (ref 36.0–46.0)
Hemoglobin: 10.5 g/dL — ABNORMAL LOW (ref 12.0–15.0)
MCH: 31.5 pg (ref 26.0–34.0)
MCHC: 31.8 g/dL (ref 30.0–36.0)
MCV: 99.1 fL (ref 80.0–100.0)
Platelets: 149 10*3/uL — ABNORMAL LOW (ref 150–400)
RBC: 3.33 MIL/uL — ABNORMAL LOW (ref 3.87–5.11)
RDW: 14.2 % (ref 11.5–15.5)
WBC: 6.3 10*3/uL (ref 4.0–10.5)
nRBC: 0 % (ref 0.0–0.2)

## 2019-09-02 LAB — HEPARIN LEVEL (UNFRACTIONATED): Heparin Unfractionated: 0.51 IU/mL (ref 0.30–0.70)

## 2019-09-02 MED ORDER — MENTHOL 3 MG MT LOZG
1.0000 | LOZENGE | OROMUCOSAL | Status: DC | PRN
  Administered 2019-09-02: 3 mg via ORAL
  Filled 2019-09-02: qty 9

## 2019-09-02 NOTE — Progress Notes (Signed)
CARDIAC REHAB PHASE I   PRE:  Rate/Rhythm: 59 SB  BP:  Supine:   Sitting: 93/33, 106/49  Left arm  Standing:    SaO2: 97%RA  MODE:  Ambulation: 470 ft   POST:  Rate/Rhythm: 84 SR  BP:  Supine:   Sitting: 146/42 left arm  Standing:    SaO2: 97%RA 1008-1039 Pt walked 470 ft on RA with gait belt use and asst x 1 with steady gait and tolerated well. Denied dizziness or weakness. To recliner with call bell and chair alarm.   Graylon Good, RN BSN  09/02/2019 10:36 AM

## 2019-09-02 NOTE — Progress Notes (Signed)
   09/02/19 1013  Vitals  BP (!) 106/49  MAP (mmHg) 67  BP Location Left Arm  BP Method Automatic  Patient Position (if appropriate) Sitting

## 2019-09-02 NOTE — Progress Notes (Signed)
ANTICOAGULATION CONSULT NOTE -   Pharmacy Consult for Heparin  Indication: chest pain/ACS  Allergies  Allergen Reactions  . Statins Other (See Comments)    Muscle weakness   Patient Measurements: Height: 5\' 2"  (157.5 cm) Weight: 83.1 kg (183 lb 4.8 oz) IBW/kg (Calculated) : 50.1    Vital Signs: Temp: 98.4 F (36.9 C) (07/14 0716) Temp Source: Oral (07/14 0716) BP: 128/44 (07/14 0716) Pulse Rate: 63 (07/14 0716)  Labs: Recent Labs    08/31/19 0331 08/31/19 0331 09/01/19 0413 09/01/19 1059 09/02/19 0552  HGB 10.6*   < > 11.0*  --  10.5*  HCT 34.0*  --  34.1*  --  33.0*  PLT 139*  --  163  --  149*  APTT  --   --   --  116*  --   LABPROT  --   --   --  14.1  --   INR  --   --   --  1.1  --   HEPARINUNFRC 0.52  --  0.40  --  0.51  CREATININE 0.83  --   --   --   --    < > = values in this interval not displayed.    Estimated Creatinine Clearance: 54 mL/min (by C-G formula based on SCr of 0.83 mg/dL).   Medical History: Past Medical History:  Diagnosis Date  . Anxiety   . Arthritis   . Cancer (HCC)    UTERINE  . Coronary artery disease   . Depression   . Dyspnea   . Heart murmur   . HOH (hard of hearing)   . Hyperlipidemia   . Hypothyroidism   . Non-STEMI (non-ST elevated myocardial infarction) [facility])     Assessment: 72 yoF admitted with ACS found to have multivessel CAD. Pharmacy consulted for IV heparin dosing. No AC PTA.  Heparin level therapeutic today at 0.51, H/H stable, pltc down slightly. CABG tentatively planned for 7/16. No overt bleeding documented.   Goal of Therapy:  Heparin level 0.3-0.7 units/ml Monitor platelets by anticoagulation protocol: Yes   Plan:  -Continue heparin 1000 units/h -Daily heparin level and CBC  Claudina Lick, PharmD PGY1 Acute Care Pharmacy Resident  09/02/2019 8:29 AM  Please check [email or web address] for unit-specific pharmacy phone numbers.

## 2019-09-02 NOTE — Progress Notes (Addendum)
Progress Note  Patient Name: Bonnie Hancock Date of Encounter: 09/02/2019  Primary Cardiologist: Ida Rogue, MD   Subjective   Pt feeling well today. No specific complaints of chest pain or SOB, Anticipate surgery Friday  Inpatient Medications    Scheduled Meds:  aspirin EC  81 mg Oral Daily   Chlorhexidine Gluconate Cloth  6 each Topical Daily   escitalopram  10 mg Oral Daily   ezetimibe  10 mg Oral QHS   levothyroxine  50 mcg Oral Q0600   losartan  12.5 mg Oral Daily   metoprolol tartrate  12.5 mg Oral BID   mupirocin ointment  1 application Nasal BID   rosuvastatin  5 mg Oral q1800   Continuous Infusions:  heparin 1,000 Units/hr (09/01/19 1911)   PRN Meds: acetaminophen   Vital Signs    Vitals:   09/02/19 0253 09/02/19 0317 09/02/19 0333 09/02/19 0716  BP:  (!) 97/37 (!) 102/45 (!) 128/44  Pulse:  (!) 59 (!) 53 63  Resp:  19  18  Temp:  98.3 F (36.8 C)  98.4 F (36.9 C)  TempSrc:  Oral  Oral  SpO2:  99%  99%  Weight: 83.1 kg     Height:        Intake/Output Summary (Last 24 hours) at 09/02/2019 0741 Last data filed at 09/02/2019 0300 Gross per 24 hour  Intake 866.85 ml  Output 950 ml  Net -83.15 ml   Filed Weights   08/31/19 0336 09/01/19 0410 09/02/19 0253  Weight: 83.6 kg 83.9 kg 83.1 kg    Physical Exam   General: Well developed, well nourished, NAD Neck: Negative for carotid bruits. No JVD Lungs:Clear to ausculation bilaterally. No wheezes, rales, or rhonchi. Breathing is unlabored. Cardiovascular: RRR with S1 S2. No murmurs Abdomen: Soft, non-tender, non-distended. No obvious abdominal masses. Extremities: No edema. Radial pulses 2+ bilaterally Neuro: Alert and oriented. No focal deficits. No facial asymmetry. MAE spontaneously. Psych: Responds to questions appropriately with normal affect.    Labs    Chemistry Recent Labs  Lab 08/27/19 0051 08/28/19 0228 08/31/19 0331  NA 138 139 140  K 4.3 3.8 4.0  CL 103 107 108  CO2  28 26 23   GLUCOSE 107* 96 98  BUN 30* 15 11  CREATININE 0.99 0.74 0.83  CALCIUM 9.1 9.0 9.1  GFRNONAA 54* >60 >60  GFRAA >60 >60 >60  ANIONGAP 7 6 9      Hematology Recent Labs  Lab 08/31/19 0331 09/01/19 0413 09/02/19 0552  WBC 7.3 8.0 6.3  RBC 3.48* 3.45* 3.33*  HGB 10.6* 11.0* 10.5*  HCT 34.0* 34.1* 33.0*  MCV 97.7 98.8 99.1  MCH 30.5 31.9 31.5  MCHC 31.2 32.3 31.8  RDW 13.9 14.1 14.2  PLT 139* 163 149*    Cardiac EnzymesNo results for input(s): TROPONINI in the last 168 hours. No results for input(s): TROPIPOC in the last 168 hours.   BNPNo results for input(s): BNP, PROBNP in the last 168 hours.   DDimer No results for input(s): DDIMER in the last 168 hours.   Radiology    DG Chest 2 View  Result Date: 09/01/2019 CLINICAL DATA:  Preop CABG. EXAM: CHEST - 2 VIEW COMPARISON:  08/25/2019 FINDINGS: The heart size and mediastinal contours are within normal limits. Both lungs are clear. Spondylosis identified within the thoracic spine. IMPRESSION: No active cardiopulmonary abnormalities. Electronically Signed   By: Kerby Moors M.D.   On: 09/01/2019 11:53   VAS [place]  LOWER EXTREMITY SAPHENOUS VEIN MAPPING  Result Date: 09/01/2019 LOWER EXTREMITY VEIN MAPPING Indications: pre op cabg, Edema, and varicosities  Comparison Study: no prior Performing Technologist: Abram Sander RVS  Examination Guidelines: A complete evaluation includes B-mode imaging, spectral Doppler, color Doppler, and power Doppler as needed of all accessible portions of each vessel. Bilateral testing is considered an integral part of a complete examination. Limited examinations for reoccurring indications may be performed as noted. +---------------+----------+---------------------+--------------+--------------+   RT Diameter      RT             GSV          LT Diameter   LT Findings        (cm)       Findings                           (cm)                     +---------------+----------+---------------------+--------------+--------------+      0.68      branching    Saphenofemoral         0.77                                                   Junction                                    +---------------+----------+---------------------+--------------+--------------+      0.18      branching    Proximal thigh         0.23       branching    +---------------+----------+---------------------+--------------+--------------+      0.27      branching       Mid thigh           0.26                    +---------------+----------+---------------------+--------------+--------------+      0.15                    Distal thigh          0.20                    +---------------+----------+---------------------+--------------+--------------+      0.30      branching         Knee              0.21       branching    +---------------+----------+---------------------+--------------+--------------+      0.20                      Prox calf                    not visualized +---------------+----------+---------------------+--------------+--------------+      0.17                      Mid calf                     not visualized +---------------+----------+---------------------+--------------+--------------+      0.17  Distal calf                   not visualized +---------------+----------+---------------------+--------------+--------------+      0.24                        Ankle                      not visualized +---------------+----------+---------------------+--------------+--------------+ +----------------+-----------+---------------+----------------+--------------+ RT diameter (cm)RT Findings      SSV      LT Diameter (cm) LT Findings   +----------------+-----------+---------------+----------------+--------------+       0.22                 Popliteal fossa      0.35                      +----------------+-----------+---------------+----------------+--------------+       0.23                  Proximal calf       0.30                     +----------------+-----------+---------------+----------------+--------------+       0.21       branching    Mid calf          0.24        branching    +----------------+-----------+---------------+----------------+--------------+                              Distal calf                  not visualized +----------------+-----------+---------------+----------------+--------------+ Diagnosing physician: Harold Barban MD Electronically signed by Harold Barban MD on 09/01/2019 at 4:24:13 PM.    Final    VAS US DOPPLER PRE CABG  Result Date: 09/01/2019 PREOPERATIVE VASCULAR EVALUATION  Indications:      Pre-CABG. Risk Factors:     Hyperlipidemia, coronary artery disease. Comparison Study: no prior Performing Technologist: Abram Sander RVS  Examination Guidelines: A complete evaluation includes B-mode imaging, spectral Doppler, color Doppler, and power Doppler as needed of all accessible portions of each vessel. Bilateral testing is considered an integral part of a complete examination. Limited examinations for reoccurring indications may be performed as noted.  Right Carotid Findings: +----------+--------+--------+--------+------------+--------+           PSV cm/sEDV cm/sStenosisDescribe    Comments +----------+--------+--------+--------+------------+--------+ CCA Prox  84      17              heterogenous         +----------+--------+--------+--------+------------+--------+ CCA Distal65      12              heterogenous         +----------+--------+--------+--------+------------+--------+ ICA Prox  105     24      1-39%   heterogenous         +----------+--------+--------+--------+------------+--------+ ICA Distal113     27                                    +----------+--------+--------+--------+------------+--------+ ECA       67                                           +----------+--------+--------+--------+------------+--------+  Portions of this table do not appear on this page. +----------+--------+-------+--------+------------+           PSV cm/sEDV cmsDescribeArm Pressure +----------+--------+-------+--------+------------+ Subclavian81                                  +----------+--------+-------+--------+------------+ +---------+--------+--+--------+--+---------+ VertebralPSV cm/s63EDV cm/s16Antegrade +---------+--------+--+--------+--+---------+ Left Carotid Findings: +----------+--------+--------+--------+------------+--------+           PSV cm/sEDV cm/sStenosisDescribe    Comments +----------+--------+--------+--------+------------+--------+ CCA Prox  82      12              heterogenous         +----------+--------+--------+--------+------------+--------+ CCA Distal82      14              heterogenous         +----------+--------+--------+--------+------------+--------+ ICA Prox  76      17      1-39%   heterogenous         +----------+--------+--------+--------+------------+--------+ ICA Distal87      22                                   +----------+--------+--------+--------+------------+--------+ ECA       79      7                                    +----------+--------+--------+--------+------------+--------+ +----------+--------+--------+--------+------------+ SubclavianPSV cm/sEDV cm/sDescribeArm Pressure +----------+--------+--------+--------+------------+           91                                   +----------+--------+--------+--------+------------+ +---------+--------+--+--------+--+---------+ VertebralPSV cm/s52EDV cm/s11Antegrade +---------+--------+--+--------+--+---------+  ABI Findings: +--------+------------------+-----+---------+--------+ Right   Rt  Pressure (mmHg)IndexWaveform Comment  +--------+------------------+-----+---------+--------+ [id number]                    triphasic         +--------+------------------+-----+---------+--------+ ATA                            triphasic         +--------+------------------+-----+---------+--------+ PTA                            triphasic         +--------+------------------+-----+---------+--------+ +--------+------------------+-----+---------+-------+ Left    Lt Pressure (mmHg)IndexWaveform Comment +--------+------------------+-----+---------+-------+ Brachial                       triphasic        +--------+------------------+-----+---------+-------+ ATA                            triphasic        +--------+------------------+-----+---------+-------+ PTA                            triphasic        +--------+------------------+-----+---------+-------+  Right Doppler Findings: +--------+--------+-----+---------+--------+ Site    PressureIndexDoppler  Comments +--------+--------+-----+---------+--------+ [id number]          triphasic         +--------+--------+-----+---------+--------+ Radial  triphasic         +--------+--------+-----+---------+--------+ Ulnar                triphasic         +--------+--------+-----+---------+--------+  Left Doppler Findings: +--------+--------+-----+---------+--------+ Site    PressureIndexDoppler  Comments +--------+--------+-----+---------+--------+ Brachial             triphasic         +--------+--------+-----+---------+--------+ Radial               triphasic         +--------+--------+-----+---------+--------+ Ulnar                triphasic         +--------+--------+-----+---------+--------+  Summary: Right Carotid: Velocities in the right ICA are consistent with a 1-39% stenosis. Left Carotid: Velocities in the left ICA are consistent with a 1-39% stenosis.  Vertebrals: Bilateral vertebral arteries demonstrate antegrade flow. Right Upper Extremity: Doppler waveforms remain within normal limits with right radial compression. Doppler waveform obliterate with right ulnar compression. Left Upper Extremity: Doppler waveform obliterate with left radial compression. Doppler waveforms remain within normal limits with left ulnar compression.  Electronically signed by Harold Barban MD on 09/01/2019 at 4:19:39 PM.    Final    Telemetry    09/02/19 SB with HR in the upper 50's - Personally Reviewed  ECG    No new tracing as of 07/03/19- Personally Reviewed  Cardiac Studies   LEFT HEART CATH AND CORONARY ANGIOGRAPHY 08/27/19  Conclusion   Mid LM to Dist LM lesion is 70% stenosed. Ost LAD to Prox LAD lesion is 80% stenosed. Prox Cx to Mid Cx lesion is 70% stenosed. Prox RCA to Mid RCA lesion is 99% stenosed. 2nd Mrg lesion is 60% stenosed. Mid LAD lesion is 50% stenosed. The left ventricular ejection fraction is 45-50% by visual estimate. There is no mitral valve regurgitation. LV end diastolic pressure is mildly elevated. There is mild left ventricular systolic dysfunction. Diagnostic Dominance: Right  Intervention     Echo 08/26/19  1. Left ventricular ejection fraction, by estimation, is 60 to 65%. The  left ventricle has normal function. The left ventricle has no regional  wall motion abnormalities. Left ventricular diastolic function could not  be evaluated.   2. Right ventricular systolic function is normal. The right ventricular  size is normal.   3. The mitral valve is normal in structure. Trivial mitral valve  regurgitation. No evidence of mitral stenosis.   4. The aortic valve is normal in structure. Aortic valve regurgitation is  not visualized. No aortic stenosis is present.   5. The inferior vena cava is normal in size with greater than 50%  respiratory variability, suggesting right atrial pressure of 3 mmHg.    Patient Profile       79 y.o. female with hx of chronic diastolic CHF, obesity, HLD, hypothyroidism, pulmonary nodules,  arthritis and NSTEMI with 08/27/19 LHC showing 3v CAD who  transferred to [facility] from [facility] for evaluation by CVTS for CABG versus stenting (multiple).   Assessment & Plan    1. NSTEMI: -Pt presented with NSTEMI found to have severe three vessel disease>>>plan for CABG 7/16 however noted to be high risk per TCTS note -LHC with distal LM, ostial LAD, mLCx, mRCA, ostial / proximal OM -Continue ASA, BB, statin   2. Chronic diastolic CHF: -[place] with LVEF at 45-50% and mildly elevated LVEDP with subsequent echocardiogram 08/26/19 showing EF at 60-65% -Continue BB and ARB -  Appears euvolemic on exam today    3. HLD: -LDL, 102 per lipid panel 08/27/2019 -Continue Crestor 5mg  qd and Zetia 10mg  qd>>intolerant to statins -Will refer to OP lipid clinic for consideration of PCSK9 inhibitor   4. Bradycardia: -HR, mid to upper 50's  -Asymptomatic    Signed, Kathyrn Drown NP-C [facility] Pager: [phone] 09/02/2019, 7:41 AM     Patient examined chart reviewed Doing well with no angina Waiting for CABG Friday EF normal Euvolemic with no arrhythmias Bradycardia decrease lopressor to toprol 12.5 mg once daily   Jenkins Rouge MD [facility]

## 2019-09-02 NOTE — Progress Notes (Signed)
Evening dose of PO Metoprolol not given due to patient bradycardic into the 40-50's and hold parameters on medication are for heart rate less than 50.  Patient sleeping most of evening shift and heart rate sustaining in the 40's.

## 2019-09-02 NOTE — Progress Notes (Signed)
Patient sitting in chair on telephone in no acute distress.  °

## 2019-09-02 NOTE — Progress Notes (Signed)
Patient complaining of a sore throat, no PRN ordered for sore throat, Cardiology paged.

## 2019-09-03 ENCOUNTER — Encounter (HOSPITAL_COMMUNITY): Payer: Self-pay | Admitting: Cardiovascular Disease

## 2019-09-03 ENCOUNTER — Encounter (HOSPITAL_COMMUNITY): Payer: PPO

## 2019-09-03 LAB — CBC
HCT: 31.9 % — ABNORMAL LOW (ref 36.0–46.0)
Hemoglobin: 9.9 g/dL — ABNORMAL LOW (ref 12.0–15.0)
MCH: 30.5 pg (ref 26.0–34.0)
MCHC: 31 g/dL (ref 30.0–36.0)
MCV: 98.2 fL (ref 80.0–100.0)
Platelets: 150 10*3/uL (ref 150–400)
RBC: 3.25 MIL/uL — ABNORMAL LOW (ref 3.87–5.11)
RDW: 14 % (ref 11.5–15.5)
WBC: 7.2 10*3/uL (ref 4.0–10.5)
nRBC: 0 % (ref 0.0–0.2)

## 2019-09-03 LAB — PREPARE RBC (CROSSMATCH)

## 2019-09-03 LAB — HEPARIN LEVEL (UNFRACTIONATED): Heparin Unfractionated: 0.5 IU/mL (ref 0.30–0.70)

## 2019-09-03 MED ORDER — EPINEPHRINE HCL 5 MG/250ML IV SOLN IN NS
0.0000 ug/min | INTRAVENOUS | Status: DC
  Filled 2019-09-03: qty 250

## 2019-09-03 MED ORDER — BISACODYL 5 MG PO TBEC
5.0000 mg | DELAYED_RELEASE_TABLET | Freq: Once | ORAL | Status: AC
  Administered 2019-09-03: 5 mg via ORAL
  Filled 2019-09-03: qty 1

## 2019-09-03 MED ORDER — CHLORHEXIDINE GLUCONATE 0.12 % MT SOLN
15.0000 mL | Freq: Once | OROMUCOSAL | Status: AC
  Administered 2019-09-04: 15 mL via OROMUCOSAL
  Filled 2019-09-03: qty 15

## 2019-09-03 MED ORDER — CHLORHEXIDINE GLUCONATE 4 % EX LIQD
60.0000 mL | Freq: Once | CUTANEOUS | Status: AC
  Administered 2019-09-03: 4 via TOPICAL
  Filled 2019-09-03: qty 60

## 2019-09-03 MED ORDER — MAGNESIUM SULFATE 50 % IJ SOLN
40.0000 meq | INTRAMUSCULAR | Status: DC
  Filled 2019-09-03: qty 9.85

## 2019-09-03 MED ORDER — VANCOMYCIN HCL 1250 MG/250ML IV SOLN
1250.0000 mg | INTRAVENOUS | Status: AC
  Administered 2019-09-04: 1250 mg via INTRAVENOUS
  Filled 2019-09-03: qty 250

## 2019-09-03 MED ORDER — PLASMA-LYTE 148 IV SOLN
INTRAVENOUS | Status: DC
  Filled 2019-09-03: qty 2.5

## 2019-09-03 MED ORDER — MILRINONE LACTATE IN DEXTROSE 20-5 MG/100ML-% IV SOLN
0.3000 ug/kg/min | INTRAVENOUS | Status: AC
  Administered 2019-09-04: .25 ug/kg/min via INTRAVENOUS
  Filled 2019-09-03: qty 100

## 2019-09-03 MED ORDER — DIAZEPAM 2 MG PO TABS
2.0000 mg | ORAL_TABLET | Freq: Once | ORAL | Status: AC
  Administered 2019-09-04: 2 mg via ORAL
  Filled 2019-09-03: qty 1

## 2019-09-03 MED ORDER — METOPROLOL TARTRATE 12.5 MG HALF TABLET
12.5000 mg | ORAL_TABLET | Freq: Once | ORAL | Status: DC
  Filled 2019-09-03: qty 1

## 2019-09-03 MED ORDER — PHENYLEPHRINE HCL-NACL 20-0.9 MG/250ML-% IV SOLN
30.0000 ug/min | INTRAVENOUS | Status: AC
  Administered 2019-09-04: 25 ug/min via INTRAVENOUS
  Filled 2019-09-03: qty 250

## 2019-09-03 MED ORDER — TRANEXAMIC ACID (OHS) BOLUS VIA INFUSION
15.0000 mg/kg | INTRAVENOUS | Status: AC
  Administered 2019-09-04: 1246.5 mg via INTRAVENOUS
  Filled 2019-09-03: qty 1247

## 2019-09-03 MED ORDER — NITROGLYCERIN IN D5W 200-5 MCG/ML-% IV SOLN
2.0000 ug/min | INTRAVENOUS | Status: DC
  Filled 2019-09-03: qty 250

## 2019-09-03 MED ORDER — SODIUM CHLORIDE 0.9 % IV SOLN
1.5000 g | INTRAVENOUS | Status: AC
  Administered 2019-09-04: 1.5 g via INTRAVENOUS
  Filled 2019-09-03: qty 1.5

## 2019-09-03 MED ORDER — NOREPINEPHRINE 4 MG/250ML-% IV SOLN
0.0000 ug/min | INTRAVENOUS | Status: AC
  Administered 2019-09-04: 2 ug/min via INTRAVENOUS
  Filled 2019-09-03: qty 250

## 2019-09-03 MED ORDER — DEXMEDETOMIDINE HCL IN NACL 400 MCG/100ML IV SOLN
0.1000 ug/kg/h | INTRAVENOUS | Status: AC
  Administered 2019-09-04: .7 ug/kg/h via INTRAVENOUS
  Administered 2019-09-04: .5 ug/kg/h via INTRAVENOUS
  Filled 2019-09-03: qty 100

## 2019-09-03 MED ORDER — TRANEXAMIC ACID 1000 MG/10ML IV SOLN
1.5000 mg/kg/h | INTRAVENOUS | Status: AC
  Administered 2019-09-04: 1.5 mg/kg/h via INTRAVENOUS
  Filled 2019-09-03: qty 25

## 2019-09-03 MED ORDER — TRANEXAMIC ACID (OHS) PUMP PRIME SOLUTION
2.0000 mg/kg | INTRAVENOUS | Status: DC
  Filled 2019-09-03: qty 1.66

## 2019-09-03 MED ORDER — SODIUM CHLORIDE 0.9 % IV SOLN
INTRAVENOUS | Status: DC
  Filled 2019-09-03: qty 30

## 2019-09-03 MED ORDER — INSULIN REGULAR(HUMAN) IN NACL 100-0.9 UT/100ML-% IV SOLN
INTRAVENOUS | Status: AC
  Administered 2019-09-04: .8 [IU]/h via INTRAVENOUS
  Filled 2019-09-03: qty 100

## 2019-09-03 MED ORDER — CHLORHEXIDINE GLUCONATE 4 % EX LIQD
60.0000 mL | Freq: Once | CUTANEOUS | Status: AC
  Administered 2019-09-04: 4 via TOPICAL
  Filled 2019-09-03: qty 60

## 2019-09-03 MED ORDER — SODIUM CHLORIDE 0.9 % IV SOLN
750.0000 mg | INTRAVENOUS | Status: AC
  Administered 2019-09-04: 750 mg via INTRAVENOUS
  Filled 2019-09-03: qty 750

## 2019-09-03 MED ORDER — POTASSIUM CHLORIDE 2 MEQ/ML IV SOLN
80.0000 meq | INTRAVENOUS | Status: DC
  Filled 2019-09-03: qty 40

## 2019-09-03 MED ORDER — TEMAZEPAM 15 MG PO CAPS
15.0000 mg | ORAL_CAPSULE | Freq: Once | ORAL | Status: AC | PRN
  Administered 2019-09-03: 15 mg via ORAL
  Filled 2019-09-03: qty 1

## 2019-09-03 NOTE — Progress Notes (Signed)
Pt just walked with mobility specialist. Answered her questions regarding surgery. Encouraged IS, performing 1200 mL. [place], ACSM 3:02 PM 09/03/2019

## 2019-09-03 NOTE — Progress Notes (Signed)
[facility] for Heparin  Indication: chest pain/ACS  Allergies  Allergen Reactions  . Statins Other (See Comments)    Muscle weakness   Patient Measurements: Height: 5\' 2"  (157.5 cm) Weight: 83.1 kg (183 lb 4.8 oz) IBW/kg (Calculated) : 50.1    Vital Signs: Temp: 98.9 F (37.2 C) (07/15 0532) Temp Source: Oral (07/15 0532) BP: 97/35 (07/15 0532) Pulse Rate: 60 (07/15 0532)  Labs: Recent Labs    09/01/19 0413 09/01/19 0413 09/01/19 1059 09/02/19 0552 09/03/19 0852  HGB 11.0*   < >  --  10.5* 9.9*  HCT 34.1*  --   --  33.0* 31.9*  PLT 163  --   --  149* 150  APTT  --   --  116*  --   --   LABPROT  --   --  14.1  --   --   INR  --   --  1.1  --   --   HEPARINUNFRC 0.40  --   --  0.51 0.50   < > = values in this interval not displayed.    Estimated Creatinine Clearance: 54 mL/min (by C-G formula based on SCr of 0.83 mg/dL).   Medical History: Past Medical History:  Diagnosis Date  . Anxiety   . Arthritis   . Cancer (HCC)    UTERINE  . Coronary artery disease   . Depression   . Dyspnea   . Heart murmur   . HOH (hard of hearing)   . Hyperlipidemia   . Hypothyroidism   . Non-STEMI (non-ST elevated myocardial infarction) [facility])     Assessment: 79 yo female admitted with ACS found to have multivessel CAD and the patient is scheduled for CABG tomorrow on 7/16. The patient was not on any anticoagulation PTA. Pharmacy was consulted for IV heparin dosing.  The patient is on 1000 units/hr of IV heparin with a therapeutic heparin level of 0.5 this morning. Currently, the patient's Hgb is 9.9, Hct is 31.9 and platelets are stable at 150. Will continue to monitor the patient's CBC.   Goal of Therapy:  Heparin level 0.3-0.7 units/ml Monitor platelets by anticoagulation protocol: Yes   Plan:  -Continue heparin IV 1000 units/hr -Daily heparin level and CBC  Shauna Hugh, PharmD, [place]  PGY-1 Pharmacy Resident 09/03/2019 10:43  AM  Please check [email or web address] for unit-specific pharmacy phone numbers.

## 2019-09-03 NOTE — Progress Notes (Signed)
Procedure(s) (LRB): CORONARY ARTERY BYPASS GRAFTING (CABG) (N/A) TRANSESOPHAGEAL ECHOCARDIOGRAM (TEE) (N/A) Subjective: Stable on iv heparin Plan CABG in am Benefits, risks and expected postop recovery discussed with patient  Objective: Vital signs in last 24 hours: Temp:  [98.8 F (37.1 C)-98.9 F (37.2 C)] 98.9 F (37.2 C) (07/15 0532) Pulse Rate:  [50-60] 60 (07/15 0532) Cardiac Rhythm: Sinus bradycardia (07/15 0900) Resp:  [16-18] 18 (07/15 0532) BP: (97-111)/(35-50) 97/35 (07/15 0532) SpO2:  [95 %-100 %] 95 % (07/15 0532)  Hemodynamic parameters for last 24 hours:    Intake/Output from previous day: 07/14 0701 - 07/15 0700 In: 482 [P.O.:222; I.V.:260] Out: -  Intake/Output this shift: No intake/output data recorded.       Exam    General- alert and comfortable    Neck- no JVD, no cervical adenopathy palpable, no carotid bruit   Lungs- clear without rales, wheezes   Cor- regular rate and rhythm, no murmur , gallop   Abdomen- soft, non-tender   Extremities - warm, non-tender, minimal edema   Neuro- oriented, appropriate, no focal weakness   Lab Results: Recent Labs    09/02/19 0552 09/03/19 0852  WBC 6.3 7.2  HGB 10.5* 9.9*  HCT 33.0* 31.9*  PLT 149* 150   BMET: No results for input(s): NA, K, CL, CO2, GLUCOSE, BUN, CREATININE, CALCIUM in the last 72 hours.  PT/INR:  Recent Labs    09/01/19 1059  LABPROT 14.1  INR 1.1   ABG    Component Value Date/Time   PHART 7.466 (H) 09/01/2019 1250   HCO3 20.7 09/01/2019 1250   ACIDBASEDEF 2.5 (H) 09/01/2019 1250   O2SAT 98.4 09/01/2019 1250   CBG (last 3)  No results for input(s): GLUCAP in the last 72 hours.  Assessment/Plan: S/P Procedure(s) (LRB): CORONARY ARTERY BYPASS GRAFTING (CABG) (N/A) TRANSESOPHAGEAL ECHOCARDIOGRAM (TEE) (N/A) CABG in am with grafts to LAD OM PD   LOS: 7 days    Tharon Aquas Trigt III 09/03/2019

## 2019-09-03 NOTE — Progress Notes (Signed)
      [address]       [place] [address]             [phone]     Feeling well on current medical management without change in sx.   Plan remains for surgery 7/16     John Giovanni, PA-C

## 2019-09-03 NOTE — Progress Notes (Signed)
Progress Note  Patient Name: Bonnie Hancock Date of Encounter: 09/03/2019  Primary Cardiologist: Ida Rogue, MD   Subjective   No complaints CABG in am   Inpatient Medications    Scheduled Meds: . aspirin EC  81 mg Oral Daily  . Chlorhexidine Gluconate Cloth  6 each Topical Daily  . escitalopram  10 mg Oral Daily  . ezetimibe  10 mg Oral QHS  . levothyroxine  50 mcg Oral Q0600  . losartan  12.5 mg Oral Daily  . metoprolol tartrate  12.5 mg Oral BID  . mupirocin ointment  1 application Nasal BID  . rosuvastatin  5 mg Oral q1800   Continuous Infusions: . heparin 1,000 Units/hr (09/02/19 2056)   PRN Meds: acetaminophen, menthol-cetylpyridinium   Vital Signs    Vitals:   09/02/19 1013 09/02/19 1642 09/02/19 2044 09/03/19 0532  BP: (!) 106/49 (!) 111/41 (!) 110/50 (!) 97/35  Pulse:  (!) 50 (!) 53 60  Resp:  16 18 18   Temp:  98.8 F (37.1 C) 98.9 F (37.2 C) 98.9 F (37.2 C)  TempSrc:  Oral Oral Oral  SpO2:  100% 99% 95%  Weight:      Height:        Intake/Output Summary (Last 24 hours) at 09/03/2019 0949 Last data filed at 09/03/2019 0500 Gross per 24 hour  Intake 482 ml  Output --  Net 482 ml   Filed Weights   08/31/19 0336 09/01/19 0410 09/02/19 0253  Weight: 83.6 kg 83.9 kg 83.1 kg    Physical Exam   General: Well developed, well nourished, NAD Neck: Negative for carotid bruits. No JVD Lungs:Clear to ausculation bilaterally. No wheezes, rales, or rhonchi. Breathing is unlabored. Cardiovascular: RRR with S1 S2. No murmurs Abdomen: Soft, non-tender, non-distended. No obvious abdominal masses. Extremities: No edema. Radial pulses 2+ bilaterally Neuro: Alert and oriented. No focal deficits. No facial asymmetry. MAE spontaneously. Psych: Responds to questions appropriately with normal affect.    Labs    Chemistry Recent Labs  Lab 08/28/19 0228 08/31/19 0331  NA 139 140  K 3.8 4.0  CL 107 108  CO2 26 23  GLUCOSE 96 98  BUN 15 11   CREATININE 0.74 0.83  CALCIUM 9.0 9.1  GFRNONAA >60 >60  GFRAA >60 >60  ANIONGAP 6 9     Hematology Recent Labs  Lab 09/01/19 0413 09/02/19 0552 09/03/19 0852  WBC 8.0 6.3 7.2  RBC 3.45* 3.33* 3.25*  HGB 11.0* 10.5* 9.9*  HCT 34.1* 33.0* 31.9*  MCV 98.8 99.1 98.2  MCH 31.9 31.5 30.5  MCHC 32.3 31.8 31.0  RDW 14.1 14.2 14.0  PLT 163 149* 150    Cardiac EnzymesNo results for input(s): TROPONINI in the last 168 hours. No results for input(s): TROPIPOC in the last 168 hours.   BNPNo results for input(s): BNP, PROBNP in the last 168 hours.   DDimer No results for input(s): DDIMER in the last 168 hours.   Radiology    DG Chest 2 View  Result Date: 09/01/2019 CLINICAL DATA:  Preop CABG. EXAM: CHEST - 2 VIEW COMPARISON:  08/25/2019 FINDINGS: The heart size and mediastinal contours are within normal limits. Both lungs are clear. Spondylosis identified within the thoracic spine. IMPRESSION: No active cardiopulmonary abnormalities. Electronically Signed   By: Kerby Moors M.D.   On: 09/01/2019 11:53   VAS [place] LOWER EXTREMITY SAPHENOUS VEIN MAPPING  Result Date: 09/01/2019 LOWER EXTREMITY VEIN MAPPING Indications: pre op cabg, Edema, and  varicosities  Comparison Study: no prior Performing Technologist: Abram Sander RVS  Examination Guidelines: A complete evaluation includes B-mode imaging, spectral Doppler, color Doppler, and power Doppler as needed of all accessible portions of each vessel. Bilateral testing is considered an integral part of a complete examination. Limited examinations for reoccurring indications may be performed as noted. +---------------+----------+---------------------+--------------+--------------+   RT Diameter      RT             GSV          LT Diameter   LT Findings        (cm)       Findings                           (cm)                    +---------------+----------+---------------------+--------------+--------------+      0.68      branching     Saphenofemoral         0.77                                                   Junction                                    +---------------+----------+---------------------+--------------+--------------+      0.18      branching    Proximal thigh         0.23       branching    +---------------+----------+---------------------+--------------+--------------+      0.27      branching       Mid thigh           0.26                    +---------------+----------+---------------------+--------------+--------------+      0.15                    Distal thigh          0.20                    +---------------+----------+---------------------+--------------+--------------+      0.30      branching         Knee              0.21       branching    +---------------+----------+---------------------+--------------+--------------+      0.20                      Prox calf                    not visualized +---------------+----------+---------------------+--------------+--------------+      0.17                      Mid calf                     not visualized +---------------+----------+---------------------+--------------+--------------+      0.17                     Distal calf  not visualized +---------------+----------+---------------------+--------------+--------------+      0.24                        Ankle                      not visualized +---------------+----------+---------------------+--------------+--------------+ +----------------+-----------+---------------+----------------+--------------+ RT diameter (cm)RT Findings      SSV      LT Diameter (cm) LT Findings   +----------------+-----------+---------------+----------------+--------------+       0.22                 Popliteal fossa      0.35                     +----------------+-----------+---------------+----------------+--------------+       0.23                  Proximal calf        0.30                     +----------------+-----------+---------------+----------------+--------------+       0.21       branching    Mid calf          0.24        branching    +----------------+-----------+---------------+----------------+--------------+                              Distal calf                  not visualized +----------------+-----------+---------------+----------------+--------------+ Diagnosing physician: Harold Barban MD Electronically signed by Harold Barban MD on 09/01/2019 at 4:24:13 PM.    Final    VAS US DOPPLER PRE CABG  Result Date: 09/01/2019 PREOPERATIVE VASCULAR EVALUATION  Indications:      Pre-CABG. Risk Factors:     Hyperlipidemia, coronary artery disease. Comparison Study: no prior Performing Technologist: Abram Sander RVS  Examination Guidelines: A complete evaluation includes B-mode imaging, spectral Doppler, color Doppler, and power Doppler as needed of all accessible portions of each vessel. Bilateral testing is considered an integral part of a complete examination. Limited examinations for reoccurring indications may be performed as noted.  Right Carotid Findings: +----------+--------+--------+--------+------------+--------+           PSV cm/sEDV cm/sStenosisDescribe    Comments +----------+--------+--------+--------+------------+--------+ CCA Prox  84      17              heterogenous         +----------+--------+--------+--------+------------+--------+ CCA Distal65      12              heterogenous         +----------+--------+--------+--------+------------+--------+ ICA Prox  105     24      1-39%   heterogenous         +----------+--------+--------+--------+------------+--------+ ICA Distal113     27                                   +----------+--------+--------+--------+------------+--------+ ECA       67                                            +----------+--------+--------+--------+------------+--------+ Portions of this table  do not appear on this page. +----------+--------+-------+--------+------------+           PSV cm/sEDV cmsDescribeArm Pressure +----------+--------+-------+--------+------------+ Subclavian81                                  +----------+--------+-------+--------+------------+ +---------+--------+--+--------+--+---------+ VertebralPSV cm/s63EDV cm/s16Antegrade +---------+--------+--+--------+--+---------+ Left Carotid Findings: +----------+--------+--------+--------+------------+--------+           PSV cm/sEDV cm/sStenosisDescribe    Comments +----------+--------+--------+--------+------------+--------+ CCA Prox  82      12              heterogenous         +----------+--------+--------+--------+------------+--------+ CCA Distal82      14              heterogenous         +----------+--------+--------+--------+------------+--------+ ICA Prox  76      17      1-39%   heterogenous         +----------+--------+--------+--------+------------+--------+ ICA Distal87      22                                   +----------+--------+--------+--------+------------+--------+ ECA       79      7                                    +----------+--------+--------+--------+------------+--------+ +----------+--------+--------+--------+------------+ SubclavianPSV cm/sEDV cm/sDescribeArm Pressure +----------+--------+--------+--------+------------+           91                                   +----------+--------+--------+--------+------------+ +---------+--------+--+--------+--+---------+ VertebralPSV cm/s52EDV cm/s11Antegrade +---------+--------+--+--------+--+---------+  ABI Findings: +--------+------------------+-----+---------+--------+ Right   Rt Pressure (mmHg)IndexWaveform Comment  +--------+------------------+-----+---------+--------+ [id number]                     triphasic         +--------+------------------+-----+---------+--------+ ATA                            triphasic         +--------+------------------+-----+---------+--------+ PTA                            triphasic         +--------+------------------+-----+---------+--------+ +--------+------------------+-----+---------+-------+ Left    Lt Pressure (mmHg)IndexWaveform Comment +--------+------------------+-----+---------+-------+ Brachial                       triphasic        +--------+------------------+-----+---------+-------+ ATA                            triphasic        +--------+------------------+-----+---------+-------+ PTA                            triphasic        +--------+------------------+-----+---------+-------+  Right Doppler Findings: +--------+--------+-----+---------+--------+ Site    PressureIndexDoppler  Comments +--------+--------+-----+---------+--------+ [id number]          triphasic         +--------+--------+-----+---------+--------+ Radial  triphasic         +--------+--------+-----+---------+--------+ Ulnar                triphasic         +--------+--------+-----+---------+--------+  Left Doppler Findings: +--------+--------+-----+---------+--------+ Site    PressureIndexDoppler  Comments +--------+--------+-----+---------+--------+ Brachial             triphasic         +--------+--------+-----+---------+--------+ Radial               triphasic         +--------+--------+-----+---------+--------+ Ulnar                triphasic         +--------+--------+-----+---------+--------+  Summary: Right Carotid: Velocities in the right ICA are consistent with a 1-39% stenosis. Left Carotid: Velocities in the left ICA are consistent with a 1-39% stenosis. Vertebrals: Bilateral vertebral arteries demonstrate antegrade flow. Right Upper Extremity: Doppler waveforms remain within normal  limits with right radial compression. Doppler waveform obliterate with right ulnar compression. Left Upper Extremity: Doppler waveform obliterate with left radial compression. Doppler waveforms remain within normal limits with left ulnar compression.  Electronically signed by Harold Barban MD on 09/01/2019 at 4:19:39 PM.    Final    Telemetry    09/02/19 SB with HR in the upper 50's - Personally Reviewed  ECG    No new tracing as of 07/03/19- Personally Reviewed  Cardiac Studies   LEFT HEART CATH AND CORONARY ANGIOGRAPHY 08/27/19  Conclusion    Mid LM to Dist LM lesion is 70% stenosed.  Ost LAD to Prox LAD lesion is 80% stenosed.  Prox Cx to Mid Cx lesion is 70% stenosed.  Prox RCA to Mid RCA lesion is 99% stenosed.  2nd Mrg lesion is 60% stenosed.  Mid LAD lesion is 50% stenosed.  The left ventricular ejection fraction is 45-50% by visual estimate.  There is no mitral valve regurgitation.  LV end diastolic pressure is mildly elevated.  There is mild left ventricular systolic dysfunction. Diagnostic Dominance: Right  Intervention     Echo 08/26/19  1. Left ventricular ejection fraction, by estimation, is 60 to 65%. The  left ventricle has normal function. The left ventricle has no regional  wall motion abnormalities. Left ventricular diastolic function could not  be evaluated.   2. Right ventricular systolic function is normal. The right ventricular  size is normal.   3. The mitral valve is normal in structure. Trivial mitral valve  regurgitation. No evidence of mitral stenosis.   4. The aortic valve is normal in structure. Aortic valve regurgitation is  not visualized. No aortic stenosis is present.   5. The inferior vena cava is normal in size with greater than 50%  respiratory variability, suggesting right atrial pressure of 3 mmHg.    Patient Profile     79 y.o. female with hx of chronic diastolic CHF, obesity, HLD, hypothyroidism, pulmonary nodules,  arthritis  and NSTEMI with 08/27/19 LHC showing 3v CAD who  transferred to [facility] from [facility] for evaluation by CVTS for CABG versus stenting (multiple).   Assessment & Plan    1. NSTEMI: -Pt presented with NSTEMI found to have severe three vessel disease>>>plan for CABG 7/16 however noted to be high risk per TCTS note -LHC with distal LM, ostial LAD, mLCx, mRCA, ostial / proximal OM -Continue ASA, BB, statin   2. Chronic diastolic CHF: -[place] with LVEF at 45-50% and mildly elevated LVEDP with subsequent echocardiogram  08/26/19 showing EF at 60-65% -Continue BB and ARB -Appears euvolemic on exam today    3. HLD: -LDL, 102 per lipid panel 08/27/2019 -Continue Crestor 5mg  qd and Zetia 10mg  qd>>intolerant to statins -Will refer to OP lipid clinic for consideration of PCSK9 inhibitor   4. Bradycardia: -HR, mid to upper 50's  -Asymptomatic  - lopressor dose decreased    Jenkins Rouge MD [facility]

## 2019-09-03 NOTE — Anesthesia Preprocedure Evaluation (Addendum)
Anesthesia Evaluation  Patient identified by MRN, date of birth, ID band Patient awake    Reviewed: Allergy & Precautions, NPO status , Patient's Chart, lab work & pertinent test results  Airway Mallampati: III  TM Distance: >3 FB Neck ROM: Full    Dental  (+) Dental Advisory Given, Teeth Intact   Pulmonary shortness of breath,    Pulmonary exam normal breath sounds clear to auscultation       Cardiovascular + CAD and + Past MI  Normal cardiovascular exam+ Valvular Problems/Murmurs  Rhythm:Regular Rate:Normal  Echo 08/26/2019 1. Left ventricular ejection fraction, by estimation, is 60 to 65%. The left ventricle has normal function. The left ventricle has no regional wall motion abnormalities. Left ventricular diastolic function could not be evaluated.  2. Right ventricular systolic function is normal. The right ventricular size is normal.  3. The mitral valve is normal in structure. Trivial mitral valve regurgitation. No evidence of mitral stenosis.  4. The aortic valve is normal in structure. Aortic valve regurgitation is not visualized. No aortic stenosis is present.  5. The inferior vena cava is normal in size with greater than 50% respiratory variability, suggesting right atrial pressure of 3 mmHg.    Cath 08/27/2019 Mid LM to Dist LM lesion is 70% stenosed. Ost LAD to Prox LAD lesion is 80% stenosed. Prox Cx to Mid Cx lesion is 70% stenosed. Prox RCA to Mid RCA lesion is 99% stenosed. 2nd Mrg lesion is 60% stenosed. Mid LAD lesion is 50% stenosed. The left ventricular ejection fraction is 45-50% by visual estimate. There is no mitral valve regurgitation. LV end diastolic pressure is mildly elevated. There is mild left ventricular systolic dysfunction.     Neuro/Psych PSYCHIATRIC DISORDERS Anxiety Depression negative neurological ROS     GI/Hepatic negative GI ROS, Neg liver ROS,   Endo/Other   Hypothyroidism   Renal/GU negative Renal ROS     Musculoskeletal  (+) Arthritis ,   Abdominal (+) + obese,   Peds  Hematology negative hematology ROS (+)   Anesthesia Other Findings   Reproductive/Obstetrics                           Anesthesia Physical  Anesthesia Plan  ASA: IV  Anesthesia Plan: General   Post-op Pain Management:    Induction:   PONV Risk Score and Plan: 4 or greater and Ondansetron  Airway Management Planned: Oral ETT  Additional Equipment: Arterial line, CVP, PA Cath, TEE and Ultrasound Guidance Line Placement  Intra-op Plan:   Post-operative Plan: Post-operative intubation/ventilation  Informed Consent: I have reviewed the patients History and Physical, chart, labs and discussed the procedure including the risks, benefits and alternatives for the proposed anesthesia with the patient or authorized representative who has indicated his/her understanding and acceptance.     Dental advisory given  Plan Discussed with: CRNA  Anesthesia Plan Comments:        Anesthesia Quick Evaluation

## 2019-09-04 ENCOUNTER — Inpatient Hospital Stay (HOSPITAL_COMMUNITY): Payer: PPO

## 2019-09-04 ENCOUNTER — Inpatient Hospital Stay (HOSPITAL_COMMUNITY): Payer: PPO | Admitting: Certified Registered Nurse Anesthetist

## 2019-09-04 ENCOUNTER — Inpatient Hospital Stay (HOSPITAL_COMMUNITY)
Admission: AD | Disposition: A | Discharge status: Home Health | Payer: Self-pay | Source: Other Acute Inpatient Hospital | Attending: Cardiovascular Disease

## 2019-09-04 DIAGNOSIS — Z951 Presence of aortocoronary bypass graft: Secondary | ICD-10-CM

## 2019-09-04 HISTORY — PX: CORONARY ARTERY BYPASS GRAFT: SHX141

## 2019-09-04 HISTORY — PX: ENDOVEIN HARVEST OF GREATER SAPHENOUS VEIN: SHX5059

## 2019-09-04 HISTORY — PX: TEE WITHOUT CARDIOVERSION: SHX5443

## 2019-09-04 LAB — POCT I-STAT, CHEM 8
BUN: 10 mg/dL (ref 8–23)
BUN: 10 mg/dL (ref 8–23)
BUN: 9 mg/dL (ref 8–23)
BUN: 9 mg/dL (ref 8–23)
BUN: 9 mg/dL (ref 8–23)
BUN: 9 mg/dL (ref 8–23)
Calcium, Ion: 1.27 mmol/L (ref 1.15–1.40)
Calcium, Ion: 1.27 mmol/L (ref 1.15–1.40)
Calcium, Ion: 0.99 mmol/L — ABNORMAL LOW (ref 1.15–1.40)
Calcium, Ion: 1.08 mmol/L — ABNORMAL LOW (ref 1.15–1.40)
Calcium, Ion: 1.09 mmol/L — ABNORMAL LOW (ref 1.15–1.40)
Calcium, Ion: 1.33 mmol/L (ref 1.15–1.40)
Chloride: 105 mmol/L (ref 98–111)
Chloride: 108 mmol/L (ref 98–111)
Chloride: 103 mmol/L (ref 98–111)
Chloride: 105 mmol/L (ref 98–111)
Chloride: 106 mmol/L (ref 98–111)
Chloride: 108 mmol/L (ref 98–111)
Creatinine, Ser: 0.6 mg/dL (ref 0.44–1.00)
Creatinine, Ser: 0.6 mg/dL (ref 0.44–1.00)
Creatinine, Ser: 0.5 mg/dL (ref 0.44–1.00)
Creatinine, Ser: 0.6 mg/dL (ref 0.44–1.00)
Creatinine, Ser: 0.6 mg/dL (ref 0.44–1.00)
Creatinine, Ser: 0.6 mg/dL (ref 0.44–1.00)
Glucose, Bld: 91 mg/dL (ref 70–99)
Glucose, Bld: 101 mg/dL — ABNORMAL HIGH (ref 70–99)
Glucose, Bld: 113 mg/dL — ABNORMAL HIGH (ref 70–99)
Glucose, Bld: 144 mg/dL — ABNORMAL HIGH (ref 70–99)
Glucose, Bld: 155 mg/dL — ABNORMAL HIGH (ref 70–99)
Glucose, Bld: 154 mg/dL — ABNORMAL HIGH (ref 70–99)
HCT: 29 % — ABNORMAL LOW (ref 36.0–46.0)
HCT: 27 % — ABNORMAL LOW (ref 36.0–46.0)
HCT: 23 % — ABNORMAL LOW (ref 36.0–46.0)
HCT: 26 % — ABNORMAL LOW (ref 36.0–46.0)
HCT: 26 % — ABNORMAL LOW (ref 36.0–46.0)
HCT: 28 % — ABNORMAL LOW (ref 36.0–46.0)
Hemoglobin: 9.9 g/dL — ABNORMAL LOW (ref 12.0–15.0)
Hemoglobin: 9.2 g/dL — ABNORMAL LOW (ref 12.0–15.0)
Hemoglobin: 7.8 g/dL — ABNORMAL LOW (ref 12.0–15.0)
Hemoglobin: 8.8 g/dL — ABNORMAL LOW (ref 12.0–15.0)
Hemoglobin: 8.8 g/dL — ABNORMAL LOW (ref 12.0–15.0)
Hemoglobin: 9.5 g/dL — ABNORMAL LOW (ref 12.0–15.0)
Potassium: 4.2 mmol/L (ref 3.5–5.1)
Potassium: 4.1 mmol/L (ref 3.5–5.1)
Potassium: 5.6 mmol/L — ABNORMAL HIGH (ref 3.5–5.1)
Potassium: 5.1 mmol/L (ref 3.5–5.1)
Potassium: 4.9 mmol/L (ref 3.5–5.1)
Potassium: 3.8 mmol/L (ref 3.5–5.1)
Sodium: 142 mmol/L (ref 135–145)
Sodium: 141 mmol/L (ref 135–145)
Sodium: 139 mmol/L (ref 135–145)
Sodium: 142 mmol/L (ref 135–145)
Sodium: 142 mmol/L (ref 135–145)
Sodium: 144 mmol/L (ref 135–145)
TCO2: 24 mmol/L (ref 22–32)
TCO2: 23 mmol/L (ref 22–32)
TCO2: 26 mmol/L (ref 22–32)
TCO2: 24 mmol/L (ref 22–32)
TCO2: 25 mmol/L (ref 22–32)
TCO2: 24 mmol/L (ref 22–32)

## 2019-09-04 LAB — POCT I-STAT 7, (LYTES, BLD GAS, ICA,H+H)
Acid-Base Excess: 2 mmol/L (ref 0.0–2.0)
Acid-Base Excess: 1 mmol/L (ref 0.0–2.0)
Acid-Base Excess: 2 mmol/L (ref 0.0–2.0)
Acid-base deficit: 1 mmol/L (ref 0.0–2.0)
Acid-base deficit: 1 mmol/L (ref 0.0–2.0)
Acid-base deficit: 3 mmol/L — ABNORMAL HIGH (ref 0.0–2.0)
Acid-base deficit: 3 mmol/L — ABNORMAL HIGH (ref 0.0–2.0)
Bicarbonate: 25.5 mmol/L (ref 20.0–28.0)
Bicarbonate: 25.7 mmol/L (ref 20.0–28.0)
Bicarbonate: 25.5 mmol/L (ref 20.0–28.0)
Bicarbonate: 26.5 mmol/L (ref 20.0–28.0)
Bicarbonate: 24.4 mmol/L (ref 20.0–28.0)
Bicarbonate: 23.7 mmol/L (ref 20.0–28.0)
Bicarbonate: 23 mmol/L (ref 20.0–28.0)
Calcium, Ion: 1.35 mmol/L (ref 1.15–1.40)
Calcium, Ion: 0.94 mmol/L — ABNORMAL LOW (ref 1.15–1.40)
Calcium, Ion: 1.04 mmol/L — ABNORMAL LOW (ref 1.15–1.40)
Calcium, Ion: 1.12 mmol/L — ABNORMAL LOW (ref 1.15–1.40)
Calcium, Ion: 1.41 mmol/L — ABNORMAL HIGH (ref 1.15–1.40)
Calcium, Ion: 1.49 mmol/L — ABNORMAL HIGH (ref 1.15–1.40)
Calcium, Ion: 1.47 mmol/L — ABNORMAL HIGH (ref 1.15–1.40)
HCT: 26 % — ABNORMAL LOW (ref 36.0–46.0)
HCT: 24 % — ABNORMAL LOW (ref 36.0–46.0)
HCT: 26 % — ABNORMAL LOW (ref 36.0–46.0)
HCT: 26 % — ABNORMAL LOW (ref 36.0–46.0)
HCT: 26 % — ABNORMAL LOW (ref 36.0–46.0)
HCT: 30 % — ABNORMAL LOW (ref 36.0–46.0)
HCT: 30 % — ABNORMAL LOW (ref 36.0–46.0)
Hemoglobin: 8.8 g/dL — ABNORMAL LOW (ref 12.0–15.0)
Hemoglobin: 8.2 g/dL — ABNORMAL LOW (ref 12.0–15.0)
Hemoglobin: 8.8 g/dL — ABNORMAL LOW (ref 12.0–15.0)
Hemoglobin: 8.8 g/dL — ABNORMAL LOW (ref 12.0–15.0)
Hemoglobin: 8.8 g/dL — ABNORMAL LOW (ref 12.0–15.0)
Hemoglobin: 10.2 g/dL — ABNORMAL LOW (ref 12.0–15.0)
Hemoglobin: 10.2 g/dL — ABNORMAL LOW (ref 12.0–15.0)
O2 Saturation: 100 %
O2 Saturation: 100 %
O2 Saturation: 100 %
O2 Saturation: 100 %
O2 Saturation: 100 %
O2 Saturation: 98 %
O2 Saturation: 94 %
Patient temperature: 34.8
Patient temperature: 36.2
Potassium: 4.1 mmol/L (ref 3.5–5.1)
Potassium: 4.3 mmol/L (ref 3.5–5.1)
Potassium: 5 mmol/L (ref 3.5–5.1)
Potassium: 5.2 mmol/L — ABNORMAL HIGH (ref 3.5–5.1)
Potassium: 3.8 mmol/L (ref 3.5–5.1)
Potassium: 4 mmol/L (ref 3.5–5.1)
Potassium: 4 mmol/L (ref 3.5–5.1)
Sodium: 142 mmol/L (ref 135–145)
Sodium: 144 mmol/L (ref 135–145)
Sodium: 141 mmol/L (ref 135–145)
Sodium: 142 mmol/L (ref 135–145)
Sodium: 144 mmol/L (ref 135–145)
Sodium: 144 mmol/L (ref 135–145)
Sodium: 144 mmol/L (ref 135–145)
TCO2: 27 mmol/L (ref 22–32)
TCO2: 27 mmol/L (ref 22–32)
TCO2: 27 mmol/L (ref 22–32)
TCO2: 28 mmol/L (ref 22–32)
TCO2: 26 mmol/L (ref 22–32)
TCO2: 25 mmol/L (ref 22–32)
TCO2: 24 mmol/L (ref 22–32)
pCO2 arterial: 51.5 mmHg — ABNORMAL HIGH (ref 32.0–48.0)
pCO2 arterial: 35.7 mmHg (ref 32.0–48.0)
pCO2 arterial: 37.8 mmHg (ref 32.0–48.0)
pCO2 arterial: 38.5 mmHg (ref 32.0–48.0)
pCO2 arterial: 44.5 mmHg (ref 32.0–48.0)
pCO2 arterial: 42 mmHg (ref 32.0–48.0)
pCO2 arterial: 43.5 mmHg (ref 32.0–48.0)
pH, Arterial: 7.302 — ABNORMAL LOW (ref 7.350–7.450)
pH, Arterial: 7.464 — ABNORMAL HIGH (ref 7.350–7.450)
pH, Arterial: 7.428 (ref 7.350–7.450)
pH, Arterial: 7.454 — ABNORMAL HIGH (ref 7.350–7.450)
pH, Arterial: 7.347 — ABNORMAL LOW (ref 7.350–7.450)
pH, Arterial: 7.348 — ABNORMAL LOW (ref 7.350–7.450)
pH, Arterial: 7.326 — ABNORMAL LOW (ref 7.350–7.450)
pO2, Arterial: 338 mmHg — ABNORMAL HIGH (ref 83.0–108.0)
pO2, Arterial: 359 mmHg — ABNORMAL HIGH (ref 83.0–108.0)
pO2, Arterial: 360 mmHg — ABNORMAL HIGH (ref 83.0–108.0)
pO2, Arterial: 364 mmHg — ABNORMAL HIGH (ref 83.0–108.0)
pO2, Arterial: 181 mmHg — ABNORMAL HIGH (ref 83.0–108.0)
pO2, Arterial: 110 mmHg — ABNORMAL HIGH (ref 83.0–108.0)
pO2, Arterial: 73 mmHg — ABNORMAL LOW (ref 83.0–108.0)

## 2019-09-04 LAB — MAGNESIUM: Magnesium: 3 mg/dL — ABNORMAL HIGH (ref 1.7–2.4)

## 2019-09-04 LAB — CBC
HCT: 31.1 % — ABNORMAL LOW (ref 36.0–46.0)
HCT: 32.9 % — ABNORMAL LOW (ref 36.0–46.0)
HCT: 33 % — ABNORMAL LOW (ref 36.0–46.0)
Hemoglobin: 9.8 g/dL — ABNORMAL LOW (ref 12.0–15.0)
Hemoglobin: 10.4 g/dL — ABNORMAL LOW (ref 12.0–15.0)
Hemoglobin: 10.8 g/dL — ABNORMAL LOW (ref 12.0–15.0)
MCH: 30.7 pg (ref 26.0–34.0)
MCH: 29.5 pg (ref 26.0–34.0)
MCH: 30.3 pg (ref 26.0–34.0)
MCHC: 31.5 g/dL (ref 30.0–36.0)
MCHC: 31.6 g/dL (ref 30.0–36.0)
MCHC: 32.7 g/dL (ref 30.0–36.0)
MCV: 97.5 fL (ref 80.0–100.0)
MCV: 93.2 fL (ref 80.0–100.0)
MCV: 92.7 fL (ref 80.0–100.0)
Platelets: 171 10*3/uL (ref 150–400)
Platelets: 88 10*3/uL — ABNORMAL LOW (ref 150–400)
Platelets: 114 10*3/uL — ABNORMAL LOW (ref 150–400)
RBC: 3.19 MIL/uL — ABNORMAL LOW (ref 3.87–5.11)
RBC: 3.53 MIL/uL — ABNORMAL LOW (ref 3.87–5.11)
RBC: 3.56 MIL/uL — ABNORMAL LOW (ref 3.87–5.11)
RDW: 14.1 % (ref 11.5–15.5)
RDW: 16.1 % — ABNORMAL HIGH (ref 11.5–15.5)
RDW: 15.9 % — ABNORMAL HIGH (ref 11.5–15.5)
WBC: 7.4 10*3/uL (ref 4.0–10.5)
WBC: 7.7 10*3/uL (ref 4.0–10.5)
WBC: 14.8 10*3/uL — ABNORMAL HIGH (ref 4.0–10.5)
nRBC: 0 % (ref 0.0–0.2)
nRBC: 0 % (ref 0.0–0.2)
nRBC: 0 % (ref 0.0–0.2)

## 2019-09-04 LAB — URINALYSIS, COMPLETE (UACMP) WITH MICROSCOPIC
Bilirubin Urine: NEGATIVE
Glucose, UA: NEGATIVE mg/dL
Ketones, ur: NEGATIVE mg/dL
Leukocytes,Ua: NEGATIVE
Nitrite: NEGATIVE
Protein, ur: NEGATIVE mg/dL
RBC / HPF: NONE SEEN RBC/hpf (ref 0–5)
Specific Gravity, Urine: 1.01 (ref 1.005–1.030)
WBC, UA: NONE SEEN WBC/hpf (ref 0–5)
pH: 5.5 (ref 5.0–8.0)

## 2019-09-04 LAB — APTT: aPTT: 40 seconds — ABNORMAL HIGH (ref 24–36)

## 2019-09-04 LAB — BASIC METABOLIC PANEL
Anion gap: 9 (ref 5–15)
Anion gap: 8 (ref 5–15)
BUN: 11 mg/dL (ref 8–23)
BUN: 8 mg/dL (ref 8–23)
CO2: 22 mmol/L (ref 22–32)
CO2: 21 mmol/L — ABNORMAL LOW (ref 22–32)
Calcium: 9 mg/dL (ref 8.9–10.3)
Calcium: 9.3 mg/dL (ref 8.9–10.3)
Chloride: 109 mmol/L (ref 98–111)
Chloride: 111 mmol/L (ref 98–111)
Creatinine, Ser: 0.84 mg/dL (ref 0.44–1.00)
Creatinine, Ser: 0.89 mg/dL (ref 0.44–1.00)
GFR calc Af Amer: >60 mL/min (ref >60)
GFR calc Af Amer: >60 mL/min (ref >60)
GFR calc non Af Amer: >60 mL/min (ref >60)
GFR calc non Af Amer: >60 mL/min (ref >60)
Glucose, Bld: 91 mg/dL (ref 70–99)
Glucose, Bld: 214 mg/dL — ABNORMAL HIGH (ref 70–99)
Potassium: 3.9 mmol/L (ref 3.5–5.1)
Potassium: 3.5 mmol/L (ref 3.5–5.1)
Sodium: 140 mmol/L (ref 135–145)
Sodium: 140 mmol/L (ref 135–145)

## 2019-09-04 LAB — PROTIME-INR
INR: 1.4 — ABNORMAL HIGH (ref 0.8–1.2)
Prothrombin Time: 16.9 seconds — ABNORMAL HIGH (ref 11.4–15.2)

## 2019-09-04 LAB — PLATELET COUNT: Platelets: 108 10*3/uL — ABNORMAL LOW (ref 150–400)

## 2019-09-04 LAB — GLUCOSE, CAPILLARY
Glucose-Capillary: 157 mg/dL — ABNORMAL HIGH (ref 70–99)
Glucose-Capillary: 155 mg/dL — ABNORMAL HIGH (ref 70–99)
Glucose-Capillary: 151 mg/dL — ABNORMAL HIGH (ref 70–99)
Glucose-Capillary: 151 mg/dL — ABNORMAL HIGH (ref 70–99)
Glucose-Capillary: 153 mg/dL — ABNORMAL HIGH (ref 70–99)
Glucose-Capillary: 205 mg/dL — ABNORMAL HIGH (ref 70–99)
Glucose-Capillary: 189 mg/dL — ABNORMAL HIGH (ref 70–99)
Glucose-Capillary: 173 mg/dL — ABNORMAL HIGH (ref 70–99)

## 2019-09-04 LAB — PREPARE RBC (CROSSMATCH)

## 2019-09-04 LAB — HEMOGLOBIN AND HEMATOCRIT, BLOOD
HCT: 27.2 % — ABNORMAL LOW (ref 36.0–46.0)
Hemoglobin: 8.6 g/dL — ABNORMAL LOW (ref 12.0–15.0)

## 2019-09-04 LAB — HEPARIN LEVEL (UNFRACTIONATED): Heparin Unfractionated: 0.46 IU/mL (ref 0.30–0.70)

## 2019-09-04 SURGERY — CORONARY ARTERY BYPASS GRAFTING (CABG)
Anesthesia: General | Site: Chest | Laterality: Right

## 2019-09-04 MED ORDER — METOPROLOL TARTRATE 25 MG/10 ML ORAL SUSPENSION: 12.5000 mg | Freq: Two times a day (BID) | ORAL | Status: DC

## 2019-09-04 MED ORDER — LACTATED RINGERS IV SOLN
INTRAVENOUS | Status: DC | PRN
Start: 2019-09-04 — End: 2019-09-04

## 2019-09-04 MED ORDER — HEPARIN SODIUM (PORCINE) 1000 UNIT/ML IJ SOLN
INTRAMUSCULAR | Status: DC | PRN
  Administered 2019-09-04: 2000 [IU] via INTRAVENOUS
  Administered 2019-09-04: 24000 [IU] via INTRAVENOUS

## 2019-09-04 MED ORDER — SODIUM CHLORIDE 0.9 % IV SOLN: INTRAVENOUS | Status: DC

## 2019-09-04 MED ORDER — ROCURONIUM BROMIDE 10 MG/ML (PF) SYRINGE
PREFILLED_SYRINGE | INTRAVENOUS | Status: DC | PRN
  Administered 2019-09-04 (×3): 50 mg via INTRAVENOUS
  Administered 2019-09-04: 100 mg via INTRAVENOUS
  Administered 2019-09-04: 50 mg via INTRAVENOUS

## 2019-09-04 MED ORDER — FENTANYL CITRATE (PF) 250 MCG/5ML IJ SOLN
INTRAMUSCULAR | Status: AC
  Filled 2019-09-04: qty 5

## 2019-09-04 MED ORDER — ALBUMIN HUMAN 5 % IV SOLN
INTRAVENOUS | Status: DC | PRN
Start: 2019-09-04 — End: 2019-09-04

## 2019-09-04 MED ORDER — MILRINONE LACTATE IN DEXTROSE 20-5 MG/100ML-% IV SOLN
0.2500 ug/kg/min | INTRAVENOUS | Status: DC
  Administered 2019-09-05: 0.25 ug/kg/min via INTRAVENOUS
  Administered 2019-09-05: 0.125 ug/kg/min via INTRAVENOUS
  Filled 2019-09-04 (×2): qty 100

## 2019-09-04 MED ORDER — SODIUM CHLORIDE (PF) 0.9 % IJ SOLN
OROMUCOSAL | Status: DC | PRN
  Administered 2019-09-04 (×4): 4 mL via TOPICAL

## 2019-09-04 MED ORDER — ROCURONIUM BROMIDE 10 MG/ML (PF) SYRINGE
PREFILLED_SYRINGE | INTRAVENOUS | Status: AC
  Filled 2019-09-04: qty 10

## 2019-09-04 MED ORDER — EPINEPHRINE 1 MG/10ML IJ SOSY
PREFILLED_SYRINGE | INTRAMUSCULAR | Status: AC
  Filled 2019-09-04: qty 10

## 2019-09-04 MED ORDER — ACETAMINOPHEN 160 MG/5ML PO SOLN: 650.0000 mg | Freq: Once | ORAL | Status: AC

## 2019-09-04 MED ORDER — ORAL CARE MOUTH RINSE
15.0000 mL | OROMUCOSAL | Status: DC
  Administered 2019-09-04 – 2019-09-05 (×6): 15 mL via OROMUCOSAL

## 2019-09-04 MED ORDER — ACETAMINOPHEN 500 MG PO TABS
1000.0000 mg | ORAL_TABLET | Freq: Four times a day (QID) | ORAL | Status: AC
  Administered 2019-09-05 – 2019-09-09 (×16): 1000 mg via ORAL
  Filled 2019-09-04 (×16): qty 2

## 2019-09-04 MED ORDER — SODIUM CHLORIDE 0.9% FLUSH
3.0000 mL | Freq: Two times a day (BID) | INTRAVENOUS | Status: DC
  Administered 2019-09-05 – 2019-09-10 (×9): 3 mL via INTRAVENOUS

## 2019-09-04 MED ORDER — MIDAZOLAM HCL 5 MG/5ML IJ SOLN
INTRAMUSCULAR | Status: DC | PRN
  Administered 2019-09-04: 3 mg via INTRAVENOUS
  Administered 2019-09-04: 2 mg via INTRAVENOUS
  Administered 2019-09-04: 1 mg via INTRAVENOUS

## 2019-09-04 MED ORDER — HEMOSTATIC AGENTS (NO CHARGE) OPTIME
TOPICAL | Status: DC | PRN
  Administered 2019-09-04 (×3): 1 via TOPICAL

## 2019-09-04 MED ORDER — MAGNESIUM SULFATE 4 GM/100ML IV SOLN
4.0000 g | Freq: Once | INTRAVENOUS | Status: AC
  Administered 2019-09-04: 4 g via INTRAVENOUS

## 2019-09-04 MED ORDER — SODIUM CHLORIDE 0.9 % IV SOLN: 250.0000 mL | INTRAVENOUS | Status: DC

## 2019-09-04 MED ORDER — DIPHENHYDRAMINE HCL 50 MG/ML IJ SOLN
INTRAMUSCULAR | Status: AC
  Filled 2019-09-04: qty 1

## 2019-09-04 MED ORDER — CHLORHEXIDINE GLUCONATE 0.12 % MT SOLN
15.0000 mL | OROMUCOSAL | Status: AC
  Filled 2019-09-04: qty 15

## 2019-09-04 MED ORDER — FAMOTIDINE IN NACL 20-0.9 MG/50ML-% IV SOLN
20.0000 mg | Freq: Two times a day (BID) | INTRAVENOUS | Status: DC
  Administered 2019-09-05: 20 mg via INTRAVENOUS
  Filled 2019-09-04: qty 50

## 2019-09-04 MED ORDER — CHLORHEXIDINE GLUCONATE 0.12% ORAL RINSE (MEDLINE KIT)
15.0000 mL | Freq: Two times a day (BID) | OROMUCOSAL | Status: DC
  Administered 2019-09-04 – 2019-09-08 (×7): 15 mL via OROMUCOSAL

## 2019-09-04 MED ORDER — VASOPRESSIN 20 UNIT/ML IV SOLN
INTRAVENOUS | Status: AC
  Filled 2019-09-04: qty 1

## 2019-09-04 MED ORDER — CALCIUM CHLORIDE 10 % IV SOLN
INTRAVENOUS | Status: AC
  Filled 2019-09-04: qty 20

## 2019-09-04 MED ORDER — VASOPRESSIN 20 UNITS/100 ML INFUSION FOR SHOCK
0.0000 [IU]/min | INTRAVENOUS | Status: DC
  Filled 2019-09-04: qty 100

## 2019-09-04 MED ORDER — PHENYLEPHRINE 40 MCG/ML (10ML) SYRINGE FOR IV PUSH (FOR BLOOD PRESSURE SUPPORT)
PREFILLED_SYRINGE | INTRAVENOUS | Status: DC | PRN
  Administered 2019-09-04: 80 ug via INTRAVENOUS
  Administered 2019-09-04: 40 ug via INTRAVENOUS

## 2019-09-04 MED ORDER — PROPOFOL 10 MG/ML IV BOLUS
INTRAVENOUS | Status: AC
  Filled 2019-09-04: qty 20

## 2019-09-04 MED ORDER — POTASSIUM CHLORIDE 10 MEQ/50ML IV SOLN: 10.0000 meq | INTRAVENOUS | Status: AC

## 2019-09-04 MED ORDER — LACTATED RINGERS IV SOLN: INTRAVENOUS | Status: DC

## 2019-09-04 MED ORDER — EPINEPHRINE 1 MG/10ML IJ SOSY
PREFILLED_SYRINGE | INTRAMUSCULAR | Status: DC | PRN
  Administered 2019-09-04: .1 mg via INTRAVENOUS

## 2019-09-04 MED ORDER — METOPROLOL TARTRATE 12.5 MG HALF TABLET
12.5000 mg | ORAL_TABLET | Freq: Two times a day (BID) | ORAL | Status: DC
  Administered 2019-09-06: 12.5 mg via ORAL
  Filled 2019-09-04 (×3): qty 1

## 2019-09-04 MED ORDER — SODIUM CHLORIDE 0.45 % IV SOLN: INTRAVENOUS | Status: DC | PRN

## 2019-09-04 MED ORDER — PHENYLEPHRINE HCL-NACL 20-0.9 MG/250ML-% IV SOLN: 0.0000 ug/min | INTRAVENOUS | Status: DC

## 2019-09-04 MED ORDER — PLASMA-LYTE 148 IV SOLN
INTRAVENOUS | Status: DC | PRN
  Administered 2019-09-04: 500 mL

## 2019-09-04 MED ORDER — POTASSIUM CHLORIDE 10 MEQ/50ML IV SOLN
10.0000 meq | INTRAVENOUS | Status: AC
  Administered 2019-09-04 – 2019-09-05 (×3): 10 meq via INTRAVENOUS

## 2019-09-04 MED ORDER — VASOPRESSIN 20 UNITS/100 ML INFUSION FOR SHOCK
0.0000 [IU]/min | INTRAVENOUS | Status: AC
  Administered 2019-09-04: .03 [IU]/min via INTRAVENOUS
  Filled 2019-09-04: qty 100

## 2019-09-04 MED ORDER — METOPROLOL TARTRATE 5 MG/5ML IV SOLN: 2.5000 mg | INTRAVENOUS | Status: DC | PRN

## 2019-09-04 MED ORDER — MIDAZOLAM HCL (PF) 10 MG/2ML IJ SOLN
INTRAMUSCULAR | Status: AC
  Filled 2019-09-04: qty 2

## 2019-09-04 MED ORDER — ACETAMINOPHEN 160 MG/5ML PO SOLN
1000.0000 mg | Freq: Four times a day (QID) | ORAL | Status: DC
  Administered 2019-09-05 (×2): 1000 mg
  Filled 2019-09-04 (×2): qty 40.6

## 2019-09-04 MED ORDER — PANTOPRAZOLE SODIUM 40 MG PO TBEC
40.0000 mg | DELAYED_RELEASE_TABLET | Freq: Every day | ORAL | Status: DC
  Administered 2019-09-06 – 2019-09-11 (×6): 40 mg via ORAL
  Filled 2019-09-04 (×7): qty 1

## 2019-09-04 MED ORDER — METHYLPREDNISOLONE SODIUM SUCC 125 MG IJ SOLR
INTRAMUSCULAR | Status: DC | PRN
  Administered 2019-09-04: 125 mg via INTRAVENOUS

## 2019-09-04 MED ORDER — SODIUM CHLORIDE 0.9 % IV SOLN
20.0000 ug | Freq: Once | INTRAVENOUS | Status: AC
  Administered 2019-09-04: 20 ug via INTRAVENOUS
  Filled 2019-09-04: qty 5

## 2019-09-04 MED ORDER — PROPOFOL 10 MG/ML IV BOLUS
INTRAVENOUS | Status: DC | PRN
  Administered 2019-09-04: 30 mg via INTRAVENOUS

## 2019-09-04 MED ORDER — SODIUM CHLORIDE 0.9 % IV SOLN
1.5000 g | Freq: Two times a day (BID) | INTRAVENOUS | Status: AC
  Administered 2019-09-05 – 2019-09-06 (×4): 1.5 g via INTRAVENOUS
  Filled 2019-09-04 (×4): qty 1.5

## 2019-09-04 MED ORDER — BISACODYL 5 MG PO TBEC
10.0000 mg | DELAYED_RELEASE_TABLET | Freq: Every day | ORAL | Status: DC
  Administered 2019-09-05 – 2019-09-11 (×6): 10 mg via ORAL
  Filled 2019-09-04 (×6): qty 2

## 2019-09-04 MED ORDER — DIPHENHYDRAMINE HCL 50 MG/ML IJ SOLN
INTRAMUSCULAR | Status: DC | PRN
Start: 2019-09-04 — End: 2019-09-04
  Administered 2019-09-04: 25 mg via INTRAVENOUS

## 2019-09-04 MED ORDER — FENTANYL CITRATE (PF) 250 MCG/5ML IJ SOLN
INTRAMUSCULAR | Status: DC | PRN
  Administered 2019-09-04: 250 ug via INTRAVENOUS
  Administered 2019-09-04: 150 ug via INTRAVENOUS
  Administered 2019-09-04 (×2): 50 ug via INTRAVENOUS
  Administered 2019-09-04: 250 ug via INTRAVENOUS
  Administered 2019-09-04: 50 ug via INTRAVENOUS
  Administered 2019-09-04: 100 ug via INTRAVENOUS
  Administered 2019-09-04: 200 ug via INTRAVENOUS
  Administered 2019-09-04: 150 ug via INTRAVENOUS
  Administered 2019-09-04: 200 ug via INTRAVENOUS

## 2019-09-04 MED ORDER — OXYCODONE HCL 5 MG PO TABS: 5.0000 mg | ORAL_TABLET | ORAL | Status: DC | PRN

## 2019-09-04 MED ORDER — TRAMADOL HCL 50 MG PO TABS
50.0000 mg | ORAL_TABLET | ORAL | Status: DC | PRN
  Administered 2019-09-10: 100 mg via ORAL
  Filled 2019-09-04: qty 2

## 2019-09-04 MED ORDER — GLYCOPYRROLATE PF 0.2 MG/ML IJ SOSY
PREFILLED_SYRINGE | INTRAMUSCULAR | Status: AC
  Filled 2019-09-04: qty 1

## 2019-09-04 MED ORDER — MIDAZOLAM HCL 2 MG/2ML IJ SOLN
2.0000 mg | INTRAMUSCULAR | Status: DC | PRN
  Administered 2019-09-04 – 2019-09-05 (×3): 2 mg via INTRAVENOUS
  Filled 2019-09-04 (×3): qty 2

## 2019-09-04 MED ORDER — BISACODYL 10 MG RE SUPP: 10.0000 mg | Freq: Every day | RECTAL | Status: DC

## 2019-09-04 MED ORDER — NITROGLYCERIN IN D5W 200-5 MCG/ML-% IV SOLN: 0.0000 ug/min | INTRAVENOUS | Status: DC

## 2019-09-04 MED ORDER — ASPIRIN EC 325 MG PO TBEC
325.0000 mg | DELAYED_RELEASE_TABLET | Freq: Every day | ORAL | Status: DC
  Administered 2019-09-05 – 2019-09-11 (×7): 325 mg via ORAL
  Filled 2019-09-04 (×7): qty 1

## 2019-09-04 MED ORDER — DEXMEDETOMIDINE HCL IN NACL 400 MCG/100ML IV SOLN
0.1000 ug/kg/h | INTRAVENOUS | Status: DC
  Filled 2019-09-04 (×2): qty 100

## 2019-09-04 MED ORDER — ACETAMINOPHEN 650 MG RE SUPP
650.0000 mg | Freq: Once | RECTAL | Status: AC
  Administered 2019-09-04: 650 mg via RECTAL

## 2019-09-04 MED ORDER — NOREPINEPHRINE 4 MG/250ML-% IV SOLN
0.0000 ug/min | INTRAVENOUS | Status: DC
  Filled 2019-09-04: qty 250

## 2019-09-04 MED ORDER — INSULIN REGULAR(HUMAN) IN NACL 100-0.9 UT/100ML-% IV SOLN
INTRAVENOUS | Status: DC
  Administered 2019-09-05: 3 [IU]/h via INTRAVENOUS
  Filled 2019-09-04: qty 100

## 2019-09-04 MED ORDER — LACTATED RINGERS IV SOLN: INTRAVENOUS | Status: DC | PRN

## 2019-09-04 MED ORDER — ALBUMIN HUMAN 5 % IV SOLN
250.0000 mL | INTRAVENOUS | Status: DC | PRN
  Administered 2019-09-04 (×2): 12.5 g via INTRAVENOUS

## 2019-09-04 MED ORDER — ASPIRIN 81 MG PO CHEW: 324.0000 mg | CHEWABLE_TABLET | Freq: Every day | ORAL | Status: DC

## 2019-09-04 MED ORDER — DOCUSATE SODIUM 100 MG PO CAPS
200.0000 mg | ORAL_CAPSULE | Freq: Every day | ORAL | Status: DC
  Administered 2019-09-05 – 2019-09-11 (×6): 200 mg via ORAL
  Filled 2019-09-04 (×6): qty 2

## 2019-09-04 MED ORDER — PROTAMINE SULFATE 10 MG/ML IV SOLN
INTRAVENOUS | Status: DC | PRN
  Administered 2019-09-04: 260 mg via INTRAVENOUS

## 2019-09-04 MED ORDER — 0.9 % SODIUM CHLORIDE (POUR BTL) OPTIME
TOPICAL | Status: DC | PRN
  Administered 2019-09-04: 1000 mL
  Administered 2019-09-04: 5000 mL

## 2019-09-04 MED ORDER — MORPHINE SULFATE (PF) 2 MG/ML IV SOLN
1.0000 mg | INTRAVENOUS | Status: DC | PRN
  Administered 2019-09-05 – 2019-09-06 (×3): 2 mg via INTRAVENOUS
  Filled 2019-09-04: qty 2
  Filled 2019-09-04 (×2): qty 1

## 2019-09-04 MED ORDER — CALCIUM CHLORIDE 10 % IV SOLN
INTRAVENOUS | Status: DC | PRN
Start: 2019-09-04 — End: 2019-09-04
  Administered 2019-09-04: 1000 mg via INTRAVENOUS
  Administered 2019-09-04: 500 mg via INTRAVENOUS

## 2019-09-04 MED ORDER — ONDANSETRON HCL 4 MG/2ML IJ SOLN
4.0000 mg | Freq: Four times a day (QID) | INTRAMUSCULAR | Status: DC | PRN
  Administered 2019-09-06 – 2019-09-07 (×2): 4 mg via INTRAVENOUS
  Filled 2019-09-04 (×2): qty 2

## 2019-09-04 MED ORDER — LACTATED RINGERS IV SOLN: 500.0000 mL | Freq: Once | INTRAVENOUS | Status: DC | PRN

## 2019-09-04 MED ORDER — DEXTROSE 50 % IV SOLN: 0.0000 mL | INTRAVENOUS | Status: DC | PRN

## 2019-09-04 MED ORDER — SODIUM CHLORIDE 0.9% IV SOLUTION: Freq: Once | INTRAVENOUS | Status: DC

## 2019-09-04 MED ORDER — CHLORHEXIDINE GLUCONATE CLOTH 2 % EX PADS
6.0000 | MEDICATED_PAD | Freq: Every day | CUTANEOUS | Status: DC
  Administered 2019-09-05 – 2019-09-09 (×5): 6 via TOPICAL

## 2019-09-04 MED ORDER — DEXMEDETOMIDINE HCL IN NACL 400 MCG/100ML IV SOLN
0.0000 ug/kg/h | INTRAVENOUS | Status: DC
  Administered 2019-09-04 – 2019-09-05 (×2): 0.7 ug/kg/h via INTRAVENOUS
  Filled 2019-09-04 (×2): qty 100

## 2019-09-04 MED ORDER — SODIUM CHLORIDE 0.9% FLUSH
3.0000 mL | INTRAVENOUS | Status: DC | PRN
  Administered 2019-09-10: 3 mL via INTRAVENOUS

## 2019-09-04 MED ORDER — VANCOMYCIN HCL IN DEXTROSE 1-5 GM/200ML-% IV SOLN
1000.0000 mg | Freq: Once | INTRAVENOUS | Status: AC
  Administered 2019-09-04: 1000 mg via INTRAVENOUS
  Filled 2019-09-04: qty 200

## 2019-09-04 SURGICAL SUPPLY — 104 items
ADAPTER CARDIO PERF ANTE/RETRO (ADAPTER) ×5 IMPLANT
BAG DECANTER FOR FLEXI CONT (MISCELLANEOUS) ×5 IMPLANT
BLADE CLIPPER SURG (BLADE) ×5 IMPLANT
BLADE MINI RND TIP GREEN BEAV (BLADE) ×5 IMPLANT
BLADE STERNUM SYSTEM 6 (BLADE) ×5 IMPLANT
BLADE SURG 12 STRL SS (BLADE) ×5 IMPLANT
BNDG ELASTIC 4X5.8 VLCR STR LF (GAUZE/BANDAGES/DRESSINGS) ×5 IMPLANT
BNDG ELASTIC 6X5.8 VLCR STR LF (GAUZE/BANDAGES/DRESSINGS) ×5 IMPLANT
BNDG GAUZE ELAST 4 BULKY (GAUZE/BANDAGES/DRESSINGS) ×5 IMPLANT
CANISTER SUCT 3000ML PPV (MISCELLANEOUS) ×5 IMPLANT
CANNULA GUNDRY RCSP 15FR (MISCELLANEOUS) ×5 IMPLANT
CATH CPB KIT VANTRIGT (MISCELLANEOUS) ×5 IMPLANT
CATH ROBINSON RED A/P 18FR (CATHETERS) ×15 IMPLANT
CATH THORACIC 28FR RT ANG (CATHETERS) ×10 IMPLANT
CHLORAPREP W/TINT 26 (MISCELLANEOUS) ×5 IMPLANT
CLIP RETRACTION 3.0MM CORONARY (MISCELLANEOUS) ×5 IMPLANT
CLIP VESOCCLUDE SM WIDE 24/CT (CLIP) ×5 IMPLANT
DERMABOND ADVANCED (GAUZE/BANDAGES/DRESSINGS) ×2
DERMABOND ADVANCED .7 DNX12 (GAUZE/BANDAGES/DRESSINGS) ×3 IMPLANT
DOPPLER CAUTERY SUPPRESSOR (INSTRUMENTS) ×5 IMPLANT
DRAIN CHANNEL 32F RND 10.7 FF (WOUND CARE) ×5 IMPLANT
DRAPE CARDIOVASCULAR INCISE (DRAPES) ×2
DRAPE SLUSH/WARMER DISC (DRAPES) ×5 IMPLANT
DRAPE SRG 135X102X78XABS (DRAPES) ×3 IMPLANT
DRSG AQUACEL AG ADV 3.5X14 (GAUZE/BANDAGES/DRESSINGS) ×5 IMPLANT
ELECT BLADE 4.0 EZ CLEAN MEGAD (MISCELLANEOUS) ×5
ELECT BLADE 6.5 EXT (BLADE) ×5 IMPLANT
ELECT CAUTERY BLADE 6.4 (BLADE) ×5 IMPLANT
ELECT REM PT RETURN 9FT ADLT (ELECTROSURGICAL) ×10
ELECTRODE BLDE 4.0 EZ CLN MEGD (MISCELLANEOUS) ×3 IMPLANT
ELECTRODE REM PT RTRN 9FT ADLT (ELECTROSURGICAL) ×6 IMPLANT
FELT TEFLON 1X6 (MISCELLANEOUS) ×5 IMPLANT
GAUZE SPONGE 4X4 12PLY STRL (GAUZE/BANDAGES/DRESSINGS) ×10 IMPLANT
GAUZE SPONGE 4X4 12PLY STRL LF (GAUZE/BANDAGES/DRESSINGS) ×10 IMPLANT
GLOVE BIO SURGEON STRL SZ 6.5 (GLOVE) ×8 IMPLANT
GLOVE BIO SURGEON STRL SZ7.5 (GLOVE) ×15 IMPLANT
GLOVE BIO SURGEONS STRL SZ 6.5 (GLOVE) ×2
GLOVE BIOGEL PI IND STRL 6.5 (GLOVE) ×3 IMPLANT
GLOVE BIOGEL PI IND STRL 7.5 (GLOVE) ×3 IMPLANT
GLOVE BIOGEL PI INDICATOR 6.5 (GLOVE) ×2
GLOVE BIOGEL PI INDICATOR 7.5 (GLOVE) ×2
GLOVE SURG SS PI 7.0 STRL IVOR (GLOVE) ×5 IMPLANT
GLOVE SURG SS PI 7.5 STRL IVOR (GLOVE) ×20 IMPLANT
GOWN STRL REUS W/ TWL LRG LVL3 (GOWN DISPOSABLE) ×12 IMPLANT
GOWN STRL REUS W/TWL LRG LVL3 (GOWN DISPOSABLE) ×8
HEMOSTAT POWDER SURGIFOAM 1G (HEMOSTASIS) ×15 IMPLANT
HEMOSTAT SURGICEL 2X14 (HEMOSTASIS) ×5 IMPLANT
INSERT FOGARTY XLG (MISCELLANEOUS) IMPLANT
KIT BASIN OR (CUSTOM PROCEDURE TRAY) ×5 IMPLANT
KIT SUCTION CATH 14FR (SUCTIONS) ×5 IMPLANT
KIT TURNOVER KIT B (KITS) ×5 IMPLANT
KIT VASOVIEW HEMOPRO 2 VH 4000 (KITS) ×5 IMPLANT
LEAD PACING MYOCARDI (MISCELLANEOUS) ×5 IMPLANT
MARKER GRAFT CORONARY BYPASS (MISCELLANEOUS) ×15 IMPLANT
MATRIX HEMOSTAT SURGIFLO (HEMOSTASIS) IMPLANT
NS IRRIG 1000ML POUR BTL (IV SOLUTION) ×25 IMPLANT
PACK E OPEN HEART (SUTURE) ×5 IMPLANT
PACK OPEN HEART (CUSTOM PROCEDURE TRAY) ×5 IMPLANT
PAD ARMBOARD 7.5X6 YLW CONV (MISCELLANEOUS) ×10 IMPLANT
PAD ELECT DEFIB RADIOL ZOLL (MISCELLANEOUS) ×5 IMPLANT
PENCIL BUTTON HOLSTER BLD 10FT (ELECTRODE) ×5 IMPLANT
POSITIONER HEAD DONUT 9IN (MISCELLANEOUS) ×5 IMPLANT
PUNCH AORTIC ROTATE 4.0MM (MISCELLANEOUS) ×5 IMPLANT
PUNCH AORTIC ROTATE 4.5MM 8IN (MISCELLANEOUS) IMPLANT
PUNCH AORTIC ROTATE 5MM 8IN (MISCELLANEOUS) IMPLANT
SET CARDIOPLEGIA MPS 5001102 (MISCELLANEOUS) ×5 IMPLANT
SPONGE LAP 18X18 RF (DISPOSABLE) ×10 IMPLANT
SPONGE LAP 4X18 RFD (DISPOSABLE) ×5 IMPLANT
SUPPORT HEART JANKE-BARRON (MISCELLANEOUS) ×5 IMPLANT
SURGIFLO W/THROMBIN 8M KIT (HEMOSTASIS) ×10 IMPLANT
SUT BONE WAX W31G (SUTURE) ×5 IMPLANT
SUT MNCRL AB 4-0 PS2 18 (SUTURE) IMPLANT
SUT PROLENE 3 0 SH DA (SUTURE) ×5 IMPLANT
SUT PROLENE 3 0 SH1 36 (SUTURE) IMPLANT
SUT PROLENE 4 0 RB 1 (SUTURE) ×8
SUT PROLENE 4 0 SH DA (SUTURE) ×5 IMPLANT
SUT PROLENE 4-0 RB1 .5 CRCL 36 (SUTURE) ×12 IMPLANT
SUT PROLENE 5 0 C 1 36 (SUTURE) IMPLANT
SUT PROLENE 6 0 C 1 30 (SUTURE) ×15 IMPLANT
SUT PROLENE 6 0 CC (SUTURE) ×45 IMPLANT
SUT PROLENE 8 0 BV175 6 (SUTURE) ×45 IMPLANT
SUT PROLENE BLUE 7 0 (SUTURE) ×5 IMPLANT
SUT SILK  1 MH (SUTURE)
SUT SILK 1 MH (SUTURE) IMPLANT
SUT SILK 2 0 SH CR/8 (SUTURE) ×5 IMPLANT
SUT SILK 3 0 SH CR/8 (SUTURE) IMPLANT
SUT STEEL 6MS V (SUTURE) ×10 IMPLANT
SUT STEEL SZ 6 DBL 3X14 BALL (SUTURE) ×5 IMPLANT
SUT VIC AB 1 CTX 36 (SUTURE) ×12
SUT VIC AB 1 CTX36XBRD ANBCTR (SUTURE) ×18 IMPLANT
SUT VIC AB 2-0 CT1 27 (SUTURE) ×2
SUT VIC AB 2-0 CT1 TAPERPNT 27 (SUTURE) ×3 IMPLANT
SUT VIC AB 2-0 CTX 27 (SUTURE) IMPLANT
SUT VIC AB 3-0 X1 27 (SUTURE) ×5 IMPLANT
SYR 5ML LUER SLIP (SYRINGE) ×5 IMPLANT
SYSTEM SAHARA CHEST DRAIN ATS (WOUND CARE) ×5 IMPLANT
TAPE CLOTH SURG 4X10 WHT LF (GAUZE/BANDAGES/DRESSINGS) ×5 IMPLANT
TAPE PAPER 2X10 WHT MICROPORE (GAUZE/BANDAGES/DRESSINGS) ×5 IMPLANT
TOWEL GREEN STERILE (TOWEL DISPOSABLE) ×5 IMPLANT
TOWEL GREEN STERILE FF (TOWEL DISPOSABLE) ×5 IMPLANT
TRAY FOLEY SLVR 16FR TEMP STAT (SET/KITS/TRAYS/PACK) ×5 IMPLANT
TUBING LAP HI FLOW INSUFFLATIO (TUBING) ×5 IMPLANT
UNDERPAD 30X36 HEAVY ABSORB (UNDERPADS AND DIAPERS) ×5 IMPLANT
WATER STERILE IRR 1000ML POUR (IV SOLUTION) ×10 IMPLANT

## 2019-09-04 NOTE — Anesthesia Procedure Notes (Addendum)
Central Venous Catheter Insertion Performed by: Nolon Nations, MD, anesthesiologist Patient location: Pre-op. Preanesthetic checklist: patient identified, IV checked, site marked, risks and benefits discussed, surgical consent, monitors and equipment checked, pre-op evaluation, timeout performed and anesthesia consent Position: Trendelenburg Lidocaine 1% used for infiltration and patient sedated Hand hygiene performed  and maximum sterile barriers used  Catheter size: 9 Fr MAC introducer Procedure performed using ultrasound guided technique. Ultrasound Notes:anatomy identified, needle tip was noted to be adjacent to the nerve/plexus identified, no ultrasound evidence of intravascular and/or intraneural injection and image(s) printed for medical record Attempts: 1 Following insertion, line sutured and dressing applied. Post procedure assessment: blood return through all ports, free fluid flow and no air  Patient tolerated the procedure well with no immediate complications. Additional procedure comments: Attempted to pass PA catheter, but met resistance shortly after the end of the introducer. Wire passed through introducer, and it wouldn't pass farther than a couple of CM past the end of the catheter. Marland Kitchen

## 2019-09-04 NOTE — Transfer of Care (Signed)
Immediate Anesthesia Transfer of Care Note  Patient: Bonnie Hancock  Procedure(s) Performed: CORONARY ARTERY BYPASS GRAFTING (CABG), ON PUMP, TIMES THREE, LIMA TO LAD, SVG TO PDA, SVG TO OM 2 (N/A Chest) TRANSESOPHAGEAL ECHOCARDIOGRAM (TEE) (N/A ) ENDOVEIN HARVEST OF GREATER SAPHENOUS VEIN (Right )  Patient Location: ICU  Anesthesia Type:General  Level of Consciousness: sedated and Patient remains intubated per anesthesia plan  Airway & Oxygen Therapy: Patient remains intubated per anesthesia plan and Patient placed on Ventilator (see vital sign flow sheet for setting)  Post-op Assessment: Report given to RN and Post -op Vital signs reviewed and stable  Post vital signs: Reviewed and stable  Last Vitals:  Vitals Value Taken Time  BP    Temp    Pulse    Resp    SpO2      Last Pain:  Vitals:   09/04/19 0437  TempSrc: Oral  PainSc:       Patients Stated Pain Goal: 0 (79/02/40 9735)  Complications: Presumed transfusion reaction to FFP. Dr. Prescott Gum and Dr. Lissa Hoard aware. Blood bank notified at time of reaction and appropriate paperwork/labs sent for workup.

## 2019-09-04 NOTE — Progress Notes (Signed)
TCTS BRIEF SICU PROGRESS NOTE  Day of Surgery  S/P Procedure(s) (LRB): CORONARY ARTERY BYPASS GRAFTING (CABG), ON PUMP, TIMES THREE, LIMA TO LAD, SVG TO PDA, SVG TO OM 2 (N/A) TRANSESOPHAGEAL ECHOCARDIOGRAM (TEE) (N/A) ENDOVEIN HARVEST OF GREATER SAPHENOUS VEIN (Right)   Sedated on vent AAI paced w/ stable hemodynamics O2 sats 100% Chest tube output low UOP adequate Labs okay  Plan: Continue routine early postop  Rexene Alberts, MD 09/04/2019 4:59 PM

## 2019-09-04 NOTE — Brief Op Note (Signed)
08/27/2019 - 09/04/2019  12:29 PM  PATIENT:  Federico Flake  79 y.o. female  PRE-OPERATIVE DIAGNOSIS:  Coronary Artery Disease, Recent NSTEMI  POST-OPERATIVE DIAGNOSIS:  Coronary Artery Disease, Recent NSTEMI  PROCEDURE:   CORONARY ARTERY BYPASS GRAFTING  LIMA->distal LAD SVG->OM SVG->PDA  Endoscopic Saphenous Vein Harvest,  Right Thigh  TRANSESOPHAGEAL ECHOCARDIOGRAM   SURGEON:   Ivin Poot, MD - Primary  PHYSICIAN ASSISTANT: Asahel Risden  ANESTHESIA:   general  EBL:  Per anesthesia and perfusion notes   BLOOD ADMINISTERED: 2 units PRBC's  DRAINS: Left pleural and mediastinal drains   LOCAL MEDICATIONS USED:  NONE  SPECIMEN:  No Specimen  DISPOSITION OF SPECIMEN:  N/A  COUNTS:  YES  DICTATION: .Dragon Dictation  PLAN OF CARE: Admit to inpatient   PATIENT DISPOSITION:  ICU - intubated and hemodynamically stable.   Delay start of Pharmacological VTE agent (>24hrs) due to surgical blood loss or risk of bleeding: yes

## 2019-09-04 NOTE — Anesthesia Procedure Notes (Signed)
Arterial Line Insertion Start/End7/16/2021 6:40 AM, 09/04/2019 6:55 AM Performed by: Candis Shine, CRNA, CRNA  Preanesthetic checklist: patient identified, IV checked, site marked, risks and benefits discussed, surgical consent, monitors and equipment checked, pre-op evaluation and timeout performed Lidocaine 1% used for infiltration and patient sedated Left, radial was placed Catheter size: 20 G Hand hygiene performed  and maximum sterile barriers used  Allen's test indicative of satisfactory collateral circulation Attempts: 2 Procedure performed without using ultrasound guided technique. Ultrasound Notes:anatomy identified, needle tip was noted to be adjacent to the nerve/plexus identified and no ultrasound evidence of intravascular and/or intraneural injection Following insertion, Biopatch and dressing applied. Patient tolerated the procedure well with no immediate complications. Additional procedure comments: A-line inserted by Lydiana Milley, srna under supervision of crna.

## 2019-09-04 NOTE — Anesthesia Procedure Notes (Signed)
Procedure Name: Intubation Date/Time: 09/04/2019 7:51 AM Performed by: Candis Shine, CRNA Pre-anesthesia Checklist: Patient identified, Emergency Drugs available, Suction available and Patient being monitored Patient Re-evaluated:Patient Re-evaluated prior to induction Oxygen Delivery Method: Circle System Utilized Preoxygenation: Pre-oxygenation with 100% oxygen Induction Type: IV induction Ventilation: Mask ventilation without difficulty Laryngoscope Size: Glidescope Grade View: Grade I Tube type: Oral Tube size: 8.0 mm Number of attempts: 1 Airway Equipment and Method: Oral airway,  Rigid stylet and Video-laryngoscopy Placement Confirmation: ETT inserted through vocal cords under direct vision,  positive ETCO2 and breath sounds checked- equal and bilateral Secured at: 23 cm Tube secured with: Tape Dental Injury: Teeth and Oropharynx as per pre-operative assessment  Difficulty Due To: Difficulty was unanticipated Future Recommendations: Recommend- induction with short-acting agent, and alternative techniques readily available Comments: Intubated by lauren ferm, srna after 1 attempt to DL with MAC 3 blade, patient had small mouth opening & difficult to DL. Switched to glidescope and able to successfully intubate

## 2019-09-05 ENCOUNTER — Inpatient Hospital Stay (HOSPITAL_COMMUNITY): Payer: PPO

## 2019-09-05 LAB — BPAM FFP
Blood Product Expiration Date: 202107202359
Blood Product Expiration Date: 202107212359
ISSUE DATE / TIME: 202107161236
ISSUE DATE / TIME: 202107161236
Unit Type and Rh: 600
Unit Type and Rh: 6200

## 2019-09-05 LAB — BASIC METABOLIC PANEL
Anion gap: 6 (ref 5–15)
Anion gap: 9 (ref 5–15)
BUN: 9 mg/dL (ref 8–23)
BUN: 12 mg/dL (ref 8–23)
CO2: 21 mmol/L — ABNORMAL LOW (ref 22–32)
CO2: 21 mmol/L — ABNORMAL LOW (ref 22–32)
Calcium: 9.2 mg/dL (ref 8.9–10.3)
Calcium: 9.1 mg/dL (ref 8.9–10.3)
Chloride: 113 mmol/L — ABNORMAL HIGH (ref 98–111)
Chloride: 110 mmol/L (ref 98–111)
Creatinine, Ser: 0.86 mg/dL (ref 0.44–1.00)
Creatinine, Ser: 0.95 mg/dL (ref 0.44–1.00)
GFR calc Af Amer: >60 mL/min (ref >60)
GFR calc Af Amer: >60 mL/min (ref >60)
GFR calc non Af Amer: >60 mL/min (ref >60)
GFR calc non Af Amer: 57 mL/min — ABNORMAL LOW (ref >60)
Glucose, Bld: 155 mg/dL — ABNORMAL HIGH (ref 70–99)
Glucose, Bld: 203 mg/dL — ABNORMAL HIGH (ref 70–99)
Potassium: 4.3 mmol/L (ref 3.5–5.1)
Potassium: 4 mmol/L (ref 3.5–5.1)
Sodium: 140 mmol/L (ref 135–145)
Sodium: 140 mmol/L (ref 135–145)

## 2019-09-05 LAB — CBC
HCT: 30.1 % — ABNORMAL LOW (ref 36.0–46.0)
HCT: 29.1 % — ABNORMAL LOW (ref 36.0–46.0)
Hemoglobin: 10 g/dL — ABNORMAL LOW (ref 12.0–15.0)
Hemoglobin: 9.6 g/dL — ABNORMAL LOW (ref 12.0–15.0)
MCH: 30.6 pg (ref 26.0–34.0)
MCH: 30.6 pg (ref 26.0–34.0)
MCHC: 33.2 g/dL (ref 30.0–36.0)
MCHC: 33 g/dL (ref 30.0–36.0)
MCV: 92 fL (ref 80.0–100.0)
MCV: 92.7 fL (ref 80.0–100.0)
Platelets: 113 10*3/uL — ABNORMAL LOW (ref 150–400)
Platelets: 120 10*3/uL — ABNORMAL LOW (ref 150–400)
RBC: 3.27 MIL/uL — ABNORMAL LOW (ref 3.87–5.11)
RBC: 3.14 MIL/uL — ABNORMAL LOW (ref 3.87–5.11)
RDW: 15.5 % (ref 11.5–15.5)
RDW: 15.7 % — ABNORMAL HIGH (ref 11.5–15.5)
WBC: 13.4 10*3/uL — ABNORMAL HIGH (ref 4.0–10.5)
WBC: 15.9 10*3/uL — ABNORMAL HIGH (ref 4.0–10.5)
nRBC: 0 % (ref 0.0–0.2)
nRBC: 0 % (ref 0.0–0.2)

## 2019-09-05 LAB — TYPE AND SCREEN
ABO/RH(D): O POS
Antibody Screen: NEGATIVE
Unit division: 0
Unit division: 0
Unit division: 0
Unit division: 0
Unit division: 0
Unit division: 0

## 2019-09-05 LAB — ECHO INTRAOPERATIVE TEE
AV Mean grad: 5.8 mmHg
AV Peak grad: 10.3 mmHg
Ao pk vel: 1.6 m/s
Height: 62 in
MV Vena cont: 0.3 cm
Weight: 2945.6 oz

## 2019-09-05 LAB — POCT I-STAT 7, (LYTES, BLD GAS, ICA,H+H)
Acid-base deficit: 3 mmol/L — ABNORMAL HIGH (ref 0.0–2.0)
Acid-base deficit: 3 mmol/L — ABNORMAL HIGH (ref 0.0–2.0)
Acid-base deficit: 2 mmol/L (ref 0.0–2.0)
Bicarbonate: 22.1 mmol/L (ref 20.0–28.0)
Bicarbonate: 21.6 mmol/L (ref 20.0–28.0)
Bicarbonate: 22.7 mmol/L (ref 20.0–28.0)
Calcium, Ion: 1.4 mmol/L (ref 1.15–1.40)
Calcium, Ion: 1.39 mmol/L (ref 1.15–1.40)
Calcium, Ion: 1.36 mmol/L (ref 1.15–1.40)
HCT: 28 % — ABNORMAL LOW (ref 36.0–46.0)
HCT: 28 % — ABNORMAL LOW (ref 36.0–46.0)
HCT: 29 % — ABNORMAL LOW (ref 36.0–46.0)
Hemoglobin: 9.5 g/dL — ABNORMAL LOW (ref 12.0–15.0)
Hemoglobin: 9.5 g/dL — ABNORMAL LOW (ref 12.0–15.0)
Hemoglobin: 9.9 g/dL — ABNORMAL LOW (ref 12.0–15.0)
O2 Saturation: 96 %
O2 Saturation: 93 %
O2 Saturation: 98 %
Patient temperature: 37.1
Patient temperature: 36.6
Patient temperature: 36.7
Potassium: 4.4 mmol/L (ref 3.5–5.1)
Potassium: 4.6 mmol/L (ref 3.5–5.1)
Potassium: 4.3 mmol/L (ref 3.5–5.1)
Sodium: 143 mmol/L (ref 135–145)
Sodium: 142 mmol/L (ref 135–145)
Sodium: 143 mmol/L (ref 135–145)
TCO2: 23 mmol/L (ref 22–32)
TCO2: 23 mmol/L (ref 22–32)
TCO2: 24 mmol/L (ref 22–32)
pCO2 arterial: 40.7 mmHg (ref 32.0–48.0)
pCO2 arterial: 36.3 mmHg (ref 32.0–48.0)
pCO2 arterial: 37 mmHg (ref 32.0–48.0)
pH, Arterial: 7.343 — ABNORMAL LOW (ref 7.350–7.450)
pH, Arterial: 7.381 (ref 7.350–7.450)
pH, Arterial: 7.393 (ref 7.350–7.450)
pO2, Arterial: 84 mmHg (ref 83.0–108.0)
pO2, Arterial: 66 mmHg — ABNORMAL LOW (ref 83.0–108.0)
pO2, Arterial: 107 mmHg (ref 83.0–108.0)

## 2019-09-05 LAB — GLUCOSE, CAPILLARY
Glucose-Capillary: 123 mg/dL — ABNORMAL HIGH (ref 70–99)
Glucose-Capillary: 126 mg/dL — ABNORMAL HIGH (ref 70–99)
Glucose-Capillary: 136 mg/dL — ABNORMAL HIGH (ref 70–99)
Glucose-Capillary: 136 mg/dL — ABNORMAL HIGH (ref 70–99)
Glucose-Capillary: 137 mg/dL — ABNORMAL HIGH (ref 70–99)
Glucose-Capillary: 142 mg/dL — ABNORMAL HIGH (ref 70–99)
Glucose-Capillary: 143 mg/dL — ABNORMAL HIGH (ref 70–99)
Glucose-Capillary: 150 mg/dL — ABNORMAL HIGH (ref 70–99)
Glucose-Capillary: 155 mg/dL — ABNORMAL HIGH (ref 70–99)
Glucose-Capillary: 164 mg/dL — ABNORMAL HIGH (ref 70–99)
Glucose-Capillary: 167 mg/dL — ABNORMAL HIGH (ref 70–99)
Glucose-Capillary: 184 mg/dL — ABNORMAL HIGH (ref 70–99)
Glucose-Capillary: 196 mg/dL — ABNORMAL HIGH (ref 70–99)

## 2019-09-05 LAB — BPAM RBC
Blood Product Expiration Date: 202108072359
Blood Product Expiration Date: 202108072359
Blood Product Expiration Date: 202108162359
Blood Product Expiration Date: 202108162359
Blood Product Expiration Date: 202108162359
Blood Product Expiration Date: 202108162359
ISSUE DATE / TIME: 202107160814
ISSUE DATE / TIME: 202107160814
ISSUE DATE / TIME: 202107161018
ISSUE DATE / TIME: 202107161018
ISSUE DATE / TIME: 202107170026
ISSUE DATE / TIME: 202107170327
Unit Type and Rh: 5100
Unit Type and Rh: 5100
Unit Type and Rh: 5100
Unit Type and Rh: 5100
Unit Type and Rh: 5100
Unit Type and Rh: 5100

## 2019-09-05 LAB — PREPARE FRESH FROZEN PLASMA
Unit division: 0
Unit division: 0

## 2019-09-05 LAB — COOXEMETRY PANEL
Carboxyhemoglobin: 1.1 % (ref 0.5–1.5)
Methemoglobin: 0.7 % (ref 0.0–1.5)
O2 Saturation: 69.4 %
Total hemoglobin: 9.9 g/dL — ABNORMAL LOW (ref 12.0–16.0)

## 2019-09-05 LAB — MAGNESIUM
Magnesium: 2.6 mg/dL — ABNORMAL HIGH (ref 1.7–2.4)
Magnesium: 2.2 mg/dL (ref 1.7–2.4)

## 2019-09-05 MED ORDER — FUROSEMIDE 10 MG/ML IJ SOLN
20.0000 mg | Freq: Two times a day (BID) | INTRAMUSCULAR | Status: AC
  Administered 2019-09-05 – 2019-09-06 (×3): 20 mg via INTRAVENOUS
  Filled 2019-09-05 (×3): qty 2

## 2019-09-05 MED ORDER — INSULIN ASPART 100 UNIT/ML ~~LOC~~ SOLN
0.0000 [IU] | SUBCUTANEOUS | Status: DC
  Administered 2019-09-05: 4 [IU] via SUBCUTANEOUS
  Administered 2019-09-05: 2 [IU] via SUBCUTANEOUS
  Administered 2019-09-05 – 2019-09-06 (×2): 4 [IU] via SUBCUTANEOUS
  Administered 2019-09-06 (×4): 2 [IU] via SUBCUTANEOUS
  Administered 2019-09-06: 4 [IU] via SUBCUTANEOUS
  Administered 2019-09-07: 2 [IU] via SUBCUTANEOUS

## 2019-09-05 MED ORDER — ENOXAPARIN SODIUM 30 MG/0.3ML ~~LOC~~ SOLN
30.0000 mg | Freq: Every day | SUBCUTANEOUS | Status: DC
  Administered 2019-09-06 – 2019-09-10 (×5): 30 mg via SUBCUTANEOUS
  Filled 2019-09-05 (×5): qty 0.3

## 2019-09-05 MED FILL — Vasopressin IV Soln 20 Unit/ML (For IV Infusion): INTRAVENOUS | Qty: 20 | Status: AC

## 2019-09-05 NOTE — Progress Notes (Signed)
[address]       [place] [address]             [phone]        CARDIOTHORACIC SURGERY PROGRESS NOTE   R1 Day Post-Op Procedure(s) (LRB): CORONARY ARTERY BYPASS GRAFTING (CABG), ON PUMP, TIMES THREE, LIMA TO LAD, SVG TO PDA, SVG TO OM 2 (N/A) TRANSESOPHAGEAL ECHOCARDIOGRAM (TEE) (N/A) ENDOVEIN HARVEST OF GREATER SAPHENOUS VEIN (Right)  Subjective: Sedated on vent.  Reportedly will follow some commands.  Objective: Vital signs: BP Readings from Last 1 Encounters:  09/05/19 (!) 118/57   Pulse Readings from Last 1 Encounters:  09/05/19 89   Resp Readings from Last 1 Encounters:  09/05/19 (!) 22   Temp Readings from Last 1 Encounters:  09/05/19 97.7 F (36.5 C)    Hemodynamics: PAP: (22-33)/(9-16) 23/10 CO:  [3.9 L/min-4.4 L/min] 4.3 L/min CI:  [2.1 L/min/m2-2.4 L/min/m2] 2.3 L/min/m2  Mixed venous co-ox 69%   Physical Exam:  Rhythm:   Sinus brady - AAI paced  Breath sounds: clear  Heart sounds:  RRR  Incisions:  Dressings dry, intact  Abdomen:  Soft, non-distended  Extremities:  Warm, well-perfused  Chest tubes:  low volume thin serosanguinous output, no air leak    Intake/Output from previous day: 07/16 0701 - 07/17 0700 In: 4672.7 [I.V.:2632.7; Blood:1014; NG/GT:30; IV Piggyback:996] Out: 5852 [Urine:2595; Emesis/NG output:300; Blood:1200; Chest Tube:330] Intake/Output this shift: Total I/O In: 87.1 [I.V.:87.1] Out: 70 [Urine:70]  Lab Results:  CBC: Recent Labs    09/04/19 2102 09/04/19 2102 09/05/19 0406 09/05/19 0408  WBC 14.8*  --   --  13.4*  HGB 10.8*   < > 9.5* 10.0*  HCT 33.0*   < > 28.0* 30.1*  PLT 114*  --   --  113*   < > = values in this interval not displayed.    BMET:  Recent Labs    09/04/19 2102 09/04/19 2102 09/05/19 0406 09/05/19 0408  NA 140   < > 143 140  K 3.5   < > 4.4 4.3  CL 111  --   --  113*  CO2 21*  --   --  21*  GLUCOSE 214*  --   --  155*  BUN 8  --   --  9  CREATININE 0.89   --   --  0.86  CALCIUM 9.3  --   --  9.2   < > = values in this interval not displayed.     PT/INR:   Recent Labs    09/04/19 1559  LABPROT 16.9*  INR 1.4*    CBG (last 3)  Recent Labs    09/05/19 0404 09/05/19 0459 09/05/19 0615  GLUCAP 150* 136* 142*    ABG    Component Value Date/Time   PHART 7.343 (L) 09/05/2019 0406   PCO2ART 40.7 09/05/2019 0406   PO2ART 84 09/05/2019 0406   HCO3 22.1 09/05/2019 0406   TCO2 23 09/05/2019 0406   ACIDBASEDEF 3.0 (H) 09/05/2019 0406   O2SAT 69.4 09/05/2019 0408    CXR: Clear  EKG: AAI paced, w/out acute ischemic changes, RBBB   Assessment/Plan: S/P Procedure(s) (LRB): CORONARY ARTERY BYPASS GRAFTING (CABG), ON PUMP, TIMES THREE, LIMA TO LAD, SVG TO PDA, SVG TO OM 2 (N/A) TRANSESOPHAGEAL ECHOCARDIOGRAM (TEE) (N/A) ENDOVEIN HARVEST OF GREATER SAPHENOUS VEIN (Right)  Overall stable POD1 Proceed with acute vent wean and extubation Mobilize after extubation Wean milrinone slowly  Rexene Alberts, MD  09/05/2019 9:15 AM

## 2019-09-05 NOTE — Procedures (Signed)
Extubation Procedure Note  Patient Details:   Name: Bonnie Hancock DOB: 12-31-1940 MRN: [id number]   Airway Documentation:    Vent end date: 09/05/19 Vent end time: 1015   Evaluation  O2 sats: currently acceptable Complications: No apparent complications Patient did tolerate procedure well. Bilateral Breath Sounds: Clear, Diminished   Pt extubated to 4L [place] per Dr Roxy Manns order. Pt unable to complete NIF and VC due to pt being hard of hearing (Dr Roxy Manns aware). Cuff leak was positive.  Pt is able to voice her name and no stridor noted. Pt had to be placed on 8L HFNC shortly after extubation due to sats dropping to 88%. Incentive spirometer started by [employer].    [name] 09/05/2019, 10:20 AM

## 2019-09-05 NOTE — Progress Notes (Signed)
[facility] BRIEF SICU PROGRESS NOTE  1 Day Post-Op  S/P Procedure(s) (LRB): CORONARY ARTERY BYPASS GRAFTING (CABG), ON PUMP, TIMES THREE, LIMA TO LAD, SVG TO PDA, SVG TO OM 2 (N/A) TRANSESOPHAGEAL ECHOCARDIOGRAM (TEE) (N/A) ENDOVEIN HARVEST OF GREATER SAPHENOUS VEIN (Right)   Stable day Extubated uneventfully  Plan: Continue current plan  Rexene Alberts, MD 09/05/2019 5:22 PM

## 2019-09-05 NOTE — Evaluation (Signed)
Physical Therapy Evaluation Patient Details Name: Bonnie Hancock MRN: [id number] DOB: [birth year] Today's Date: 09/05/2019   History of Present Illness  Patient is an 79 y/o female admitted 08/26/19 with NSTEMI, had cath 7/8 and found to have blockages, underwent CABG x 3 on 09/04/19.  PMH positive for obesity, HLD, hypothyroidism, and arthritis  Clinical Impression  Patient presents with decreased mobility and decreased cognition with low BP and RN working to get better capture on pacemaker.  Patient currently +2 mod to max A for mobility in chair.  She was independent helping her spouse who is a dialysis.  She will benefeit from skilled PT in the acute setting and may need follow up CIR level rehab at d/c.  PT to follow.     Follow Up Recommendations CIR    Equipment Recommendations  Rolling walker with 5" wheels    Recommendations for Other Services Rehab consult     Precautions / Restrictions Precautions Precautions: Fall;Sternal      Mobility  Bed Mobility               General bed mobility comments: in recliner and RN wanted back to recliner  Transfers Overall transfer level: Needs assistance   Transfers: Sit to/from Stand Sit to Stand: Mod assist;+2 physical assistance         General transfer comment: heavy lifiting help from recliner to stedy allowed hands on bar with elbows in, did not pivot due to focus on standing balance/tolerance/endurance  Ambulation/Gait                Stairs            Wheelchair Mobility    Modified Rankin (Stroke Patients Only)       Balance Overall balance assessment: Needs assistance Sitting-balance support: Feet supported;Bilateral upper extremity supported Sitting balance-Leahy Scale: Poor Sitting balance - Comments: UE support and assist at her back seated edge of chair   Standing balance support: Bilateral upper extremity supported Standing balance-Leahy Scale: Poor Standing balance comment: stood  about 30 seconds with cues to focus on words on sign and to read them. pt slow to respond, returned to sitting and BP 72/47, RN in room as informed pt with decreased BP and slow to respond                             Pertinent Vitals/Pain Pain Assessment: Faces Faces Pain Scale: Hurts little more Pain Location: chest Pain Descriptors / Indicators: Grimacing;Guarding Pain Intervention(s): Repositioned;Monitored during session;Limited activity within patient's tolerance    Home Living Family/patient expects to be discharged to:: Private residence Living Arrangements: Spouse/significant other Available Help at Discharge: Available PRN/intermittently (spouse is on dialysis) Type of Home: House Home Access: Stairs to enter Entrance Stairs-Rails: Left Entrance Stairs-Number of Steps: 3 Home Layout: One level Home Equipment: Environmental consultant - 2 wheels Additional Comments: spouse has had walkers    Prior Function Level of Independence: Independent               Hand Dominance        Extremity/Trunk Assessment   Upper Extremity Assessment Upper Extremity Assessment: Generalized weakness    Lower Extremity Assessment Lower Extremity Assessment: Generalized weakness       Communication   Communication: No difficulties  Cognition Arousal/Alertness: Lethargic Behavior During Therapy: Flat affect Overall Cognitive Status: Impaired/Different from baseline Area of Impairment: Orientation;Attention;Following commands;Problem solving  Orientation Level: Time (able to recall at [place] after originally giving me her home address) Current Attention Level: Sustained   Following Commands: Follows one step commands inconsistently;Follows one step commands with increased time     Problem Solving: Slow processing        General Comments General comments (skin integrity, edema, etc.): RN in room trying to get external pacer to capture and others in to  assist, pt alert but lethargic throughout    Exercises     Assessment/Plan    PT Assessment Patient needs continued PT services  PT Problem List Decreased strength;Decreased mobility;Decreased safety awareness;Decreased activity tolerance;Decreased cognition;Cardiopulmonary status limiting activity;Decreased balance;Decreased knowledge of use of DME;Pain;Decreased knowledge of precautions       PT Treatment Interventions DME instruction;Therapeutic activities;Gait training;Therapeutic exercise;Patient/family education;Balance training;Functional mobility training    PT Goals (Current goals can be found in the Care Plan section)  Acute Rehab PT Goals Patient Stated Goal: to return home PT Goal Formulation: With patient Time For Goal Achievement: 09/19/19    Frequency Min 3X/week   Barriers to discharge        Co-evaluation               AM-PAC PT "6 Clicks" Mobility  Outcome Measure Help needed turning from your back to your side while in a flat bed without using bedrails?: A Lot Help needed moving from lying on your back to sitting on the side of a flat bed without using bedrails?: Total Help needed moving to and from a bed to a chair (including a wheelchair)?: Total Help needed standing up from a chair using your arms (e.g., wheelchair or bedside chair)?: Total Help needed to walk in hospital room?: Total Help needed climbing 3-5 steps with a railing? : Total 6 Click Score: 7    End of Session Equipment Utilized During Treatment: Gait belt Activity Tolerance: Treatment limited secondary to medical complications (Comment) (BP drop) Patient left: in chair;with call bell/phone within reach;with nursing/sitter in room   PT Visit Diagnosis: Other abnormalities of gait and mobility (R26.89);Muscle weakness (generalized) (M62.81);Difficulty in walking, not elsewhere classified (R26.2)    Time: 7001-7494 PT Time Calculation (min) (ACUTE ONLY): 24 min   Charges:   PT  Evaluation $PT Eval High Complexity: 1 High PT Treatments $Therapeutic Activity: 8-22 mins        Magda Kiel, PT Acute Rehabilitation Services WHQPR:[phone] [phone] 09/05/2019   Reginia Naas 09/05/2019, 5:10 PM

## 2019-09-05 NOTE — Anesthesia Postprocedure Evaluation (Signed)
Anesthesia Post Note  Patient: Bonnie Hancock  Procedure(s) Performed: CORONARY ARTERY BYPASS GRAFTING (CABG), ON PUMP, TIMES THREE, LIMA TO LAD, SVG TO PDA, SVG TO OM 2 (N/A Chest) TRANSESOPHAGEAL ECHOCARDIOGRAM (TEE) (N/A ) ENDOVEIN HARVEST OF GREATER SAPHENOUS VEIN (Right )     Patient location during evaluation: PACU Anesthesia Type: General Level of consciousness: sedated and patient remains intubated per anesthesia plan Pain management: pain level controlled Vital Signs Assessment: post-procedure vital signs reviewed and stable Respiratory status: patient remains intubated per anesthesia plan and patient on ventilator - see flowsheet for VS Cardiovascular status: stable Anesthetic complications: no   No complications documented.  Last Vitals:  Vitals:   09/05/19 1953 09/05/19 2000  BP:  (!) 101/56  Pulse:  89  Resp:  (!) 23  Temp: 36.6 C   SpO2:  97%    Last Pain:  Vitals:   09/05/19 1953  TempSrc: Oral  PainSc:                  Nolon Nations

## 2019-09-06 ENCOUNTER — Inpatient Hospital Stay (HOSPITAL_COMMUNITY): Payer: PPO

## 2019-09-06 LAB — COOXEMETRY PANEL
Carboxyhemoglobin: 0.8 % (ref 0.5–1.5)
Methemoglobin: 0.6 % (ref 0.0–1.5)
O2 Saturation: 74.6 %
Total hemoglobin: 8.5 g/dL — ABNORMAL LOW (ref 12.0–16.0)

## 2019-09-06 LAB — BASIC METABOLIC PANEL
Anion gap: 7 (ref 5–15)
Anion gap: 6 (ref 5–15)
BUN: 15 mg/dL (ref 8–23)
BUN: 18 mg/dL (ref 8–23)
CO2: 21 mmol/L — ABNORMAL LOW (ref 22–32)
CO2: 24 mmol/L (ref 22–32)
Calcium: 8.8 mg/dL — ABNORMAL LOW (ref 8.9–10.3)
Calcium: 8.6 mg/dL — ABNORMAL LOW (ref 8.9–10.3)
Chloride: 108 mmol/L (ref 98–111)
Chloride: 109 mmol/L (ref 98–111)
Creatinine, Ser: 0.96 mg/dL (ref 0.44–1.00)
Creatinine, Ser: 0.93 mg/dL (ref 0.44–1.00)
GFR calc Af Amer: >60 mL/min (ref >60)
GFR calc Af Amer: >60 mL/min (ref >60)
GFR calc non Af Amer: 56 mL/min — ABNORMAL LOW (ref >60)
GFR calc non Af Amer: 58 mL/min — ABNORMAL LOW (ref >60)
Glucose, Bld: 150 mg/dL — ABNORMAL HIGH (ref 70–99)
Glucose, Bld: 173 mg/dL — ABNORMAL HIGH (ref 70–99)
Potassium: 3.7 mmol/L (ref 3.5–5.1)
Potassium: 4.7 mmol/L (ref 3.5–5.1)
Sodium: 136 mmol/L (ref 135–145)
Sodium: 139 mmol/L (ref 135–145)

## 2019-09-06 LAB — CBC
HCT: 26.8 % — ABNORMAL LOW (ref 36.0–46.0)
Hemoglobin: 8.8 g/dL — ABNORMAL LOW (ref 12.0–15.0)
MCH: 30.8 pg (ref 26.0–34.0)
MCHC: 32.8 g/dL (ref 30.0–36.0)
MCV: 93.7 fL (ref 80.0–100.0)
Platelets: 121 10*3/uL — ABNORMAL LOW (ref 150–400)
RBC: 2.86 MIL/uL — ABNORMAL LOW (ref 3.87–5.11)
RDW: 15.8 % — ABNORMAL HIGH (ref 11.5–15.5)
WBC: 13.3 10*3/uL — ABNORMAL HIGH (ref 4.0–10.5)
nRBC: 0 % (ref 0.0–0.2)

## 2019-09-06 LAB — GLUCOSE, CAPILLARY
Glucose-Capillary: 118 mg/dL — ABNORMAL HIGH (ref 70–99)
Glucose-Capillary: 133 mg/dL — ABNORMAL HIGH (ref 70–99)
Glucose-Capillary: 157 mg/dL — ABNORMAL HIGH (ref 70–99)
Glucose-Capillary: 159 mg/dL — ABNORMAL HIGH (ref 70–99)
Glucose-Capillary: 160 mg/dL — ABNORMAL HIGH (ref 70–99)
Glucose-Capillary: 165 mg/dL — ABNORMAL HIGH (ref 70–99)
Glucose-Capillary: 171 mg/dL — ABNORMAL HIGH (ref 70–99)

## 2019-09-06 MED ORDER — POTASSIUM CHLORIDE 10 MEQ/50ML IV SOLN
10.0000 meq | INTRAVENOUS | Status: AC
  Administered 2019-09-06 (×3): 10 meq via INTRAVENOUS
  Filled 2019-09-06 (×2): qty 50

## 2019-09-06 MED ORDER — ~~LOC~~ CARDIAC SURGERY, PATIENT & FAMILY EDUCATION: Freq: Once | Status: AC

## 2019-09-06 MED ORDER — FUROSEMIDE 10 MG/ML IJ SOLN
40.0000 mg | Freq: Two times a day (BID) | INTRAMUSCULAR | Status: AC
  Administered 2019-09-06 – 2019-09-07 (×3): 40 mg via INTRAVENOUS
  Filled 2019-09-06 (×3): qty 4

## 2019-09-06 MED ORDER — CHLORHEXIDINE GLUCONATE 0.12 % MT SOLN
OROMUCOSAL | Status: AC
  Filled 2019-09-06: qty 15

## 2019-09-06 MED ORDER — SODIUM CHLORIDE 0.9% FLUSH: 10.0000 mL | INTRAVENOUS | Status: DC | PRN

## 2019-09-06 MED ORDER — FUROSEMIDE 10 MG/ML IJ SOLN: 20.0000 mg | Freq: Two times a day (BID) | INTRAMUSCULAR | Status: DC

## 2019-09-06 MED ORDER — POTASSIUM CHLORIDE CRYS ER 20 MEQ PO TBCR: 20.0000 meq | EXTENDED_RELEASE_TABLET | ORAL | Status: DC

## 2019-09-06 MED ORDER — SODIUM CHLORIDE 0.9% FLUSH
10.0000 mL | Freq: Two times a day (BID) | INTRAVENOUS | Status: DC
  Administered 2019-09-07 – 2019-09-10 (×5): 10 mL

## 2019-09-06 MED ORDER — OXYCODONE HCL 5 MG PO TABS: 5.0000 mg | ORAL_TABLET | ORAL | Status: DC | PRN

## 2019-09-06 MED ORDER — POTASSIUM CHLORIDE 10 MEQ/50ML IV SOLN
10.0000 meq | INTRAVENOUS | Status: AC
  Administered 2019-09-06 (×2): 10 meq via INTRAVENOUS
  Filled 2019-09-06: qty 50

## 2019-09-06 NOTE — Progress Notes (Signed)
      [address]       [place] [address]             [phone]        CARDIOTHORACIC SURGERY PROGRESS NOTE   R2 Days Post-Op Procedure(s) (LRB): CORONARY ARTERY BYPASS GRAFTING (CABG), ON PUMP, TIMES THREE, LIMA TO LAD, SVG TO PDA, SVG TO OM 2 (N/A) TRANSESOPHAGEAL ECHOCARDIOGRAM (TEE) (N/A) ENDOVEIN HARVEST OF GREATER SAPHENOUS VEIN (Right)  Subjective: Looks good.  Denies pain, SOB.  Very weak.  Objective: Vital signs: BP Readings from Last 1 Encounters:  09/06/19 (!) 128/54   Pulse Readings from Last 1 Encounters:  09/06/19 71   Resp Readings from Last 1 Encounters:  09/06/19 10   Temp Readings from Last 1 Encounters:  09/06/19 98.4 F (36.9 C)    Hemodynamics: PAP: (20-26)/(10-12) 24/11  Mixed venous co-ox 75%  Physical Exam:  Rhythm:   sinus  Breath sounds: Diminished at bases  Heart sounds:  RRR  Incisions:  Dressings dry, intact  Abdomen:  Soft, non-distended, non-tender  Extremities:  Warm, well-perfused  Chest tubes:  low volume thin serosanguinous output, no air leak    Intake/Output from previous day: 07/17 0701 - 07/18 0700 In: 829.3 [I.V.:624.4; IV Piggyback:204.9] Out: 1775 [Urine:1525; Chest Tube:250] Intake/Output this shift: Total I/O In: 56.2 [I.V.:8.8; IV Piggyback:47.4] Out: 50 [Urine:50]  Lab Results:  CBC: Recent Labs    09/05/19 1611 09/06/19 0432  WBC 15.9* 13.3*  HGB 9.6* 8.8*  HCT 29.1* 26.8*  PLT 120* 121*    BMET:  Recent Labs    09/05/19 1916 09/06/19 0432  NA 140 136  K 4.0 3.7  CL 110 108  CO2 21* 21*  GLUCOSE 203* 150*  BUN 12 15  CREATININE 0.95 0.96  CALCIUM 9.1 8.8*     PT/INR:   Recent Labs    09/04/19 1559  LABPROT 16.9*  INR 1.4*    CBG (last 3)  Recent Labs    09/06/19 0000 09/06/19 0352 09/06/19 0549  GLUCAP 160* 133* 159*    ABG    Component Value Date/Time   PHART 7.393 09/05/2019 1210   PCO2ART 37.0 09/05/2019 1210   PO2ART 107 09/05/2019 1210    HCO3 22.7 09/05/2019 1210   TCO2 24 09/05/2019 1210   ACIDBASEDEF 2.0 09/05/2019 1210   O2SAT 74.6 09/06/2019 0432    CXR: Mild bibasilar ATX  Assessment/Plan: S/P Procedure(s) (LRB): CORONARY ARTERY BYPASS GRAFTING (CABG), ON PUMP, TIMES THREE, LIMA TO LAD, SVG TO PDA, SVG TO OM 2 (N/A) TRANSESOPHAGEAL ECHOCARDIOGRAM (TEE) (N/A) ENDOVEIN HARVEST OF GREATER SAPHENOUS VEIN (Right)  Overall doing remarkably well POD2 Maintaining NSR w/ stable BP off milrinone Breathing comfortably w/ O2 sats 98-99% on HFNC, CXR looks clear Expected post op acute blood loss anemia, stable Expected post op volume excess, weight down but still reportedly 3 kg > preop, UOP adequate Expected post op atelectasis, mild Severe weakness, deconditioning   Mobilize  Diuresis  D/C tubes  PT consult  Eventually consider possible D/C to CIR service  Rexene Alberts, MD 09/06/2019 9:18 AM

## 2019-09-06 NOTE — Progress Notes (Signed)
Inpatient Rehab Admissions Coordinator Note:   Per therapy recommendations, pt was screened for CIR candidacy by Denesha Brouse, MS CCC-SLP. At this time, Pt. Appears to have functional decline and is a good candidate for CIR. Will pursue order for rehab consult per protocol.  Please contact me with questions.   Lizzie Cokley, MS, CCC-SLP Rehab Admissions Coordinator  [phone] (celll) [phone] (office)   

## 2019-09-06 NOTE — Progress Notes (Signed)
Prescott Gum, MD called to confirm pacer setting - VVI at rate 70 due to incidence of bradycardia in 50s. Will call for any changes. Orders for BMP labs.   Fuller Canada, RN 09/06/2019 [phone]

## 2019-09-07 ENCOUNTER — Encounter (HOSPITAL_COMMUNITY): Payer: Self-pay | Admitting: Cardiothoracic Surgery

## 2019-09-07 ENCOUNTER — Inpatient Hospital Stay (HOSPITAL_COMMUNITY): Payer: PPO

## 2019-09-07 LAB — BASIC METABOLIC PANEL
Anion gap: 7 (ref 5–15)
BUN: 19 mg/dL (ref 8–23)
CO2: 24 mmol/L (ref 22–32)
Calcium: 8.5 mg/dL — ABNORMAL LOW (ref 8.9–10.3)
Chloride: 108 mmol/L (ref 98–111)
Creatinine, Ser: 0.9 mg/dL (ref 0.44–1.00)
GFR calc Af Amer: >60 mL/min (ref >60)
GFR calc non Af Amer: >60 mL/min (ref >60)
Glucose, Bld: 135 mg/dL — ABNORMAL HIGH (ref 70–99)
Potassium: 4.6 mmol/L (ref 3.5–5.1)
Sodium: 139 mmol/L (ref 135–145)

## 2019-09-07 LAB — GLUCOSE, CAPILLARY
Glucose-Capillary: 125 mg/dL — ABNORMAL HIGH (ref 70–99)
Glucose-Capillary: 154 mg/dL — ABNORMAL HIGH (ref 70–99)
Glucose-Capillary: 102 mg/dL — ABNORMAL HIGH (ref 70–99)
Glucose-Capillary: 101 mg/dL — ABNORMAL HIGH (ref 70–99)
Glucose-Capillary: 97 mg/dL (ref 70–99)

## 2019-09-07 LAB — TRANSFUSION REACTION
DAT C3: NEGATIVE
Post RXN DAT IgG: NEGATIVE

## 2019-09-07 LAB — CBC
HCT: 29.4 % — ABNORMAL LOW (ref 36.0–46.0)
Hemoglobin: 9.2 g/dL — ABNORMAL LOW (ref 12.0–15.0)
MCH: 30 pg (ref 26.0–34.0)
MCHC: 31.3 g/dL (ref 30.0–36.0)
MCV: 95.8 fL (ref 80.0–100.0)
Platelets: 115 10*3/uL — ABNORMAL LOW (ref 150–400)
RBC: 3.07 MIL/uL — ABNORMAL LOW (ref 3.87–5.11)
RDW: 16.1 % — ABNORMAL HIGH (ref 11.5–15.5)
WBC: 10.3 10*3/uL (ref 4.0–10.5)
nRBC: 0 % (ref 0.0–0.2)

## 2019-09-07 MED ORDER — INSULIN ASPART 100 UNIT/ML ~~LOC~~ SOLN: 0.0000 [IU] | Freq: Every day | SUBCUTANEOUS | Status: DC

## 2019-09-07 MED ORDER — SODIUM CHLORIDE 0.9% FLUSH
10.0000 mL | Freq: Two times a day (BID) | INTRAVENOUS | Status: DC
  Administered 2019-09-08 – 2019-09-10 (×4): 10 mL

## 2019-09-07 MED ORDER — METOLAZONE 5 MG PO TABS
5.0000 mg | ORAL_TABLET | Freq: Every day | ORAL | Status: AC
  Administered 2019-09-07 – 2019-09-08 (×2): 5 mg via ORAL
  Filled 2019-09-07 (×2): qty 1

## 2019-09-07 MED ORDER — LOPERAMIDE HCL 2 MG PO CAPS
2.0000 mg | ORAL_CAPSULE | ORAL | Status: DC | PRN
  Administered 2019-09-07: 2 mg via ORAL
  Filled 2019-09-07: qty 1

## 2019-09-07 MED ORDER — SODIUM CHLORIDE 0.9% FLUSH: 10.0000 mL | INTRAVENOUS | Status: DC | PRN

## 2019-09-07 MED ORDER — INSULIN ASPART 100 UNIT/ML ~~LOC~~ SOLN
0.0000 [IU] | Freq: Three times a day (TID) | SUBCUTANEOUS | Status: DC
  Administered 2019-09-07: 3 [IU] via SUBCUTANEOUS

## 2019-09-07 MED ORDER — CHLORHEXIDINE GLUCONATE 0.12 % MT SOLN
OROMUCOSAL | Status: AC
  Filled 2019-09-07: qty 15

## 2019-09-07 MED FILL — Heparin Sodium (Porcine) Inj 1000 Unit/ML: INTRAMUSCULAR | Qty: 30 | Status: AC

## 2019-09-07 MED FILL — Magnesium Sulfate Inj 50%: INTRAMUSCULAR | Qty: 10 | Status: AC

## 2019-09-07 MED FILL — Potassium Chloride Inj 2 mEq/ML: INTRAVENOUS | Qty: 40 | Status: AC

## 2019-09-07 NOTE — Progress Notes (Signed)
Inpatient Rehab Admissions:  Inpatient Rehab Consult received.  I met with patient at the bedside for rehabilitation assessment and to discuss goals and expectations of an inpatient rehab admission.  She states she's not sure she will need rehab and that her spouse is able to help her at home.  Per chart review, She was helping her spouse who is on dialysis.  Will continue to follow for progress with therapy and medical stability.  Will also need to confirm support at home and obtain prior authorization from insurance.   Signed: Shann Medal, PT, DPT Admissions Coordinator [phone] 09/07/19  11:42 AM

## 2019-09-07 NOTE — Progress Notes (Signed)
Physical Therapy Treatment Patient Details Name: Bonnie Hancock MRN: [id number] DOB: [birth year] Today's Date: 09/07/2019    History of Present Illness Patient is an 79 y/o female admitted 08/26/19 with NSTEMI, had cath 7/8 and found to have blockages, underwent CABG x 3 on 09/04/19.  PMH positive for obesity, HLD, hypothyroidism, and arthritis    PT Comments    Pt making steady progress but remains weak. Pt told CIR she didn't think she needed rehab. I encouraged pt to consider rehab since she remains weak with poor activity tolerance. Her husband is frail and has his own medical problems so she will need to be moving fairly well prior to return home. At this time I feel pt will need rehab prior to return home.    Follow Up Recommendations  CIR     Equipment Recommendations  Rolling walker with 5" wheels    Recommendations for Other Services       Precautions / Restrictions Precautions Precautions: Fall;Sternal    Mobility  Bed Mobility               General bed mobility comments: Pt up in chair  Transfers Overall transfer level: Needs assistance Equipment used: Rolling walker (2 wheeled);1 person hand held assist Transfers: Sit to/from Stand;Stand Pivot Transfers Sit to Stand: Mod assist;+2 safety/equipment Stand pivot transfers: Min assist;+2 safety/equipment       General transfer comment: Assist to bring hips up and for balance. Verbal cues for hands to knees for sternal precautions. Chair to bsc with stand pivot with hand held assist   Ambulation/Gait Ambulation/Gait assistance: Mod assist;Min assist;+2 safety/equipment Gait Distance (Feet): 50 Feet (50' x 1, 20' x 1) Assistive device: 4-wheeled walker (EVA walker) Gait Pattern/deviations: Step-through pattern;Decreased step length - right;Decreased step length - left;Shuffle;Trunk flexed Gait velocity: decr Gait velocity interpretation: <1.31 ft/sec, indicative of household ambulator General Gait Details:  Assist for balance and support. Pt fatigues quickly and requires seated rest break. Close follow with chair.    Stairs             Wheelchair Mobility    Modified Rankin (Stroke Patients Only)       Balance Overall balance assessment: Needs assistance Sitting-balance support: Feet supported;No upper extremity supported Sitting balance-Leahy Scale: Fair     Standing balance support: Bilateral upper extremity supported Standing balance-Leahy Scale: Poor Standing balance comment: walker and min assist for static standing                            Cognition Arousal/Alertness: Awake/alert Behavior During Therapy: Flat affect Overall Cognitive Status: Impaired/Different from baseline Area of Impairment: Problem solving;Safety/judgement                         Safety/Judgement: Decreased awareness of deficits   Problem Solving: Slow processing;Requires verbal cues General Comments: Decr insight into functional status and ability to return home with husband at current level      Exercises      General Comments General comments (skin integrity, edema, etc.): Pt on 3L of O2      Pertinent Vitals/Pain Pain Assessment: Faces Faces Pain Scale: Hurts little more Pain Location: chest Pain Descriptors / Indicators: Grimacing;Guarding Pain Intervention(s): Limited activity within patient's tolerance;Repositioned    Home Living                      Prior Function  PT Goals (current goals can now be found in the care plan section) Acute Rehab PT Goals Patient Stated Goal: to return home Progress towards PT goals: Progressing toward goals    Frequency    Min 3X/week      PT Plan Current plan remains appropriate    Co-evaluation              AM-PAC PT "6 Clicks" Mobility   Outcome Measure  Help needed turning from your back to your side while in a flat bed without using bedrails?: A Lot Help needed moving  from lying on your back to sitting on the side of a flat bed without using bedrails?: Total Help needed moving to and from a bed to a chair (including a wheelchair)?: Total Help needed standing up from a chair using your arms (e.g., wheelchair or bedside chair)?: Total Help needed to walk in hospital room?: Total Help needed climbing 3-5 steps with a railing? : Total 6 Click Score: 7    End of Session Equipment Utilized During Treatment: Gait belt Activity Tolerance: Patient limited by fatigue Patient left: in chair;with call bell/phone within reach;with family/visitor present Nurse Communication: Mobility status PT Visit Diagnosis: Other abnormalities of gait and mobility (R26.89);Muscle weakness (generalized) (M62.81);Difficulty in walking, not elsewhere classified (R26.2)     Time: 4765-4650 PT Time Calculation (min) (ACUTE ONLY): 27 min  Charges:  $Gait Training: 23-37 mins                     [place] Pager [phone] Office [place] 09/07/2019, 2:56 PM

## 2019-09-07 NOTE — Progress Notes (Signed)
3 Days Post-Op Procedure(s) (LRB): CORONARY ARTERY BYPASS GRAFTING (CABG), ON PUMP, TIMES THREE, LIMA TO LAD, SVG TO PDA, SVG TO OM 2 (N/A) TRANSESOPHAGEAL ECHOCARDIOGRAM (TEE) (N/A) ENDOVEIN HARVEST OF GREATER SAPHENOUS VEIN (Right) Subjective:  a-pacing for HR 50-60 nsr Needs PT for mobilization CXR clear Objective: Vital signs in last 24 hours: Temp:  [97.1 F (36.2 C)-98.4 F (36.9 C)] 97.9 F (36.6 C) (07/19 0320) Pulse Rate:  [56-71] 70 (07/19 0600) Cardiac Rhythm: Ventricular paced (07/19 0400) Resp:  [0-22] 0 (07/19 0600) BP: (104-130)/(54-71) 122/66 (07/19 0600) SpO2:  [94 %-100 %] 96 % (07/19 0600) Arterial Line BP: (116-131)/(44-50) 131/50 (07/18 0900) Weight:  [87.7 kg] 87.7 kg (07/19 0500)  Hemodynamic parameters for last 24 hours:    Intake/Output from previous day: 07/18 0701 - 07/19 0700 In: 717.7 [P.O.:360; I.V.:12.6; IV Piggyback:345.1] Out: 1456 [Urine:1395; Stool:1; Chest Tube:60] Intake/Output this shift: No intake/output data recorded.       Exam    General- alert and comfortable    Neck- no JVD, no cervical adenopathy palpable, no carotid bruit   Lungs- clear without rales, wheezes   Cor- regular rate and rhythm, no murmur , gallop   Abdomen- soft, non-tender   Extremities - warm, non-tender, minimal edema   Neuro- oriented, appropriate, no focal weakness   Lab Results: Recent Labs    09/06/19 0432 09/07/19 0409  WBC 13.3* 10.3  HGB 8.8* 9.2*  HCT 26.8* 29.4*  PLT 121* 115*   BMET:  Recent Labs    09/06/19 1958 09/07/19 0409  NA 139 139  K 4.7 4.6  CL 109 108  CO2 24 24  GLUCOSE 173* 135*  BUN 18 19  CREATININE 0.93 0.90  CALCIUM 8.6* 8.5*    PT/INR:  Recent Labs    09/04/19 1559  LABPROT 16.9*  INR 1.4*   ABG    Component Value Date/Time   PHART 7.393 09/05/2019 1210   HCO3 22.7 09/05/2019 1210   TCO2 24 09/05/2019 1210   ACIDBASEDEF 2.0 09/05/2019 1210   O2SAT 74.6 09/06/2019 0432   CBG (last 3)  Recent Labs     09/06/19 1957 09/06/19 2351 09/07/19 0318  GLUCAP 157* 118* 125*    Assessment/Plan: S/P Procedure(s) (LRB): CORONARY ARTERY BYPASS GRAFTING (CABG), ON PUMP, TIMES THREE, LIMA TO LAD, SVG TO PDA, SVG TO OM 2 (N/A) TRANSESOPHAGEAL ECHOCARDIOGRAM (TEE) (N/A) ENDOVEIN HARVEST OF GREATER SAPHENOUS VEIN (Right) Mobilize Diuresis d/c tubes/lines keep in ICU today   LOS: 11 days    Bonnie Hancock 09/07/2019

## 2019-09-07 NOTE — Progress Notes (Signed)
CT surgery p.m. Rounds  Patient had stable day Remains AV paced for bradycardia, sinus 50-60 Up in chair in a short walk today Receiving Imodium tonight for some loose stools Blood pressure (!) 115/58, pulse 81, temperature 97.7 F (36.5 C), resp. rate 18, height 5\' 2"  (1.575 m), weight 87.7 kg, SpO2 90 %.
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
## 2019-09-07 NOTE — Discharge Instructions (Signed)

## 2019-09-07 NOTE — Op Note (Signed)
NAME: Bonnie Hancock, Bonnie Hancock MEDICAL RECORD LO:[id number] ACCOUNT [id number] DATE OF BIRTH:1940/04/27 FACILITY: [facility] LOCATION: [facility] PHYSICIAN:Javonda Suh VAN TRIGT III, MD  OPERATIVE REPORT  DATE OF PROCEDURE:  09/04/2019  OPERATION: 1.  Coronary artery bypass grafting x3 (left internal mammary artery to left anterior descending, saphenous vein graft to obtuse marginal, saphenous vein graft to posterior descending). 2.  Endoscopic harvest of right leg greater saphenous vein.  SURGEON:  Ivin Poot, MD  ASSISTANT:  Enid Cutter PA-C.  PREOPERATIVE DIAGNOSES:  Severe 3-vessel coronary artery disease, class IV angina, obesity.  POSTOPERATIVE DIAGNOSES:  Severe 3-vessel coronary artery disease, class IV angina, obesity.  ANESTHESIA:  General by Dr. Janice Norrie.  CLINICAL NOTE:  The patient is 79 years old and was evaluated at [facility] for increasing severity angina with fairly well preserved LV function by echo.  She underwent cardiac catheterization, which showed severe 3-vessel coronary  artery disease.  She was sent to this hospital for surgical evaluation for CABG, which was recommended as her best option for long-term therapy of her CAD.  I reviewed the coronary arteriograms and echocardiogram images and discussed the results with the  patient and her family.  I discussed the procedure of CABG in detail including the expected benefits, the alternatives, and the potential risks, which include stroke bleeding, blood transfusion, postoperative organ failure, postoperative infection, and  death.  She had demonstrated her understanding and agreed to proceed with surgery under what I felt was an informed consent.  The patient's preoperative studies showed adequate, but not exceptional pulmonary function test.  Her previous CABG Doppler showed no significant carotid disease.  Her vein mapping showed small but patent saphenous vein conduit and she was able to stand  and walk  in her room under my direct vision without any difficulty.  DESCRIPTION OF PROCEDURE:  The patient was brought from preoperative holding where informed consent was documented and final issues were addressed with the patient.  Unfortunately, the PA catheter could not be placed in the preoperative holding because  of difficulties traversing the SVC, so the patient was brought back to the operating room where the Swan was floated under C-arm fluoroscopy into the PA.  The patient was placed supine on the operating table and general anesthesia was induced.  She remained stable.  A transesophageal echo probe was placed by the anesthesia team.  I then placed a PA catheter through the Cordis introducer.  which was prepped  and draped as a sterile field.  Under C-arm fluoroscopy, the PA catheter floated directly into the right pulmonary artery.  The patient was then prepped and draped as a sterile field.  A proper timeout was performed.  A sternal incision was made as the saphenous vein was harvested endoscopically from the right leg.  The left internal mammary artery was harvested as a pedicle  graft from its origin at the subclavian vessels.  This was difficult because of the patient's short, obese body habitus.  The sternal retractor was placed using the deep blades due to her obese body habitus.  The pericardium was opened and suspended.   The aorta was inspected and palpated and was found to have no significant calcification.  Pursestrings were placed in the ascending aorta and right atrium and after the vein had been harvested and flushed and found to be adequate.  The patient was  heparinized and cannulated and placed on cardiopulmonary bypass.  The coronaries were identified for grafting.  The coronaries were small, approximately 1.5  mm in diameter for the LAD and 1 mm in diameter for the OM and posterior descending.   Cardioplegia cannulas were placed both antegrade and retrograde cold blood  cardioplegia, and the patient was cooled to 32 degrees.  The aortic crossclamp was applied, and a liter of cold blood cardioplegia was delivered in split doses between the  antegrade aortic and retrograde coronary sinus catheters.  There was good cardioplegic arrest and supple temperature dropped less than 14 degrees.  Cardioplegia was delivered every 20 minutes.  The distal coronary anastomoses were performed.  The first distal anastomosis was to the posterior descending.  This was a 3 mm vessel, proximal 90% stenosis.  Reverse saphenous vein was sewn with running 7-0 Prolene with good flow through the graft.   Cardioplegia was redosed.  The second distal anastomosis was to the OM, one branch of the left circumflex.  This was a 2 mm vessel with proximal 90% stenosis.  Reverse saphenous vein was sewn using running 8-0 Prolene with good flow through the graft.  Cardioplegia was redosed.  The third distal anastomosis was to the mid to distal third of the LAD.  This was a 1.5 mm vessel, proximal 90% stenosis.  The left IMA pedicle was brought through an opening in the mass in the lateral pericardium and was brought down onto the LAD and  sewn end-to-side with running 8-0 with Prolene.  There was good flow through the anastomosis after briefly releasing the pedicle was briefly releasing the clamp on the mammary pedicle.  The clamp was reapplied, and the pedicle secured to the epicardium.   Cardioplegia was redosed.  While the cross clamp was still in place, 2 proximal vein anastomoses were performed on the aorta using a 4.0 mm punch and running 6-0 Prolene.  Prior to tying down the final proximal anastomosis, air was vented from the coronaries with a dose of  retrograde warm blood cardioplegia.  The crossclamp was removed.  The heart resumed a spontaneous rhythm.  The cardioplegia cannulas were removed.  The vein grafts were opened.  Each had good flow, and hemostasis was documented at the proximal and  distal sites.  The mammary artery also had hemostasis at its  anastomosis.  Temporary pacing wires were applied and the patient was rewarmed and reperfused.  The patient was optimized for separation from cardiopulmonary bypass with low-dose milrinone.  The lungs were expanded.  Ventilator was resumed.  The patient transitioned  off cardiopulmonary bypass without difficulty.  Cardiac output was 4 liters and hemodynamics stable.  Echo showed good global LV function with mild 2+ mitral regurgitation, unchanged.  During the process of completing the protamine and also while the patient was receiving a unit of FFP, the patient had vasodilatation with vigorous cardiac function, but low mean arterial pressure, which was supported with volume, norepinephrine and  calcium.  She rebounded quickly to have a normal blood pressures.  Subsequent to this event, after the drapes were removed, it was noted  the patient had a diffuse fine rash and this was felt to be probably an allergic reaction.  She was treated with  steroids and Benadryl and remained hemodynamically stable.  The mediastinum was irrigated.  The superior pericardial fat was closed over the aorta.  The anterior mediastinum and bilateral chest tubes were placed and brought out through separate incisions.  The sternum was closed with #7 wire.  The patient  remained stable.  The echo remained normal.  The pectoralis fascia was closed with  a running #1 Vicryl.  Subcutaneous and skin layers were closed with running Vicryl.  Sterile dressings were placed and a chest x-ray was taken to the OR, which showed no  pneumothorax and the tubes and lines in good position.  Total cardiopulmonary bypass time was 150 minutes.  PN/NUANCE  D:09/04/2019 T:09/05/2019 JOB:[id number]

## 2019-09-08 ENCOUNTER — Inpatient Hospital Stay (HOSPITAL_COMMUNITY): Payer: PPO

## 2019-09-08 LAB — BASIC METABOLIC PANEL
Anion gap: 10 (ref 5–15)
BUN: 22 mg/dL (ref 8–23)
CO2: 26 mmol/L (ref 22–32)
Calcium: 8.6 mg/dL — ABNORMAL LOW (ref 8.9–10.3)
Chloride: 103 mmol/L (ref 98–111)
Creatinine, Ser: 1.02 mg/dL — ABNORMAL HIGH (ref 0.44–1.00)
GFR calc Af Amer: >60 mL/min (ref >60)
GFR calc non Af Amer: 52 mL/min — ABNORMAL LOW (ref >60)
Glucose, Bld: 85 mg/dL (ref 70–99)
Potassium: 3.4 mmol/L — ABNORMAL LOW (ref 3.5–5.1)
Sodium: 139 mmol/L (ref 135–145)

## 2019-09-08 LAB — GLUCOSE, CAPILLARY
Glucose-Capillary: 74 mg/dL (ref 70–99)
Glucose-Capillary: 140 mg/dL — ABNORMAL HIGH (ref 70–99)
Glucose-Capillary: 114 mg/dL — ABNORMAL HIGH (ref 70–99)
Glucose-Capillary: 106 mg/dL — ABNORMAL HIGH (ref 70–99)

## 2019-09-08 LAB — CBC
HCT: 29.6 % — ABNORMAL LOW (ref 36.0–46.0)
Hemoglobin: 9.4 g/dL — ABNORMAL LOW (ref 12.0–15.0)
MCH: 29.6 pg (ref 26.0–34.0)
MCHC: 31.8 g/dL (ref 30.0–36.0)
MCV: 93.1 fL (ref 80.0–100.0)
Platelets: 139 10*3/uL — ABNORMAL LOW (ref 150–400)
RBC: 3.18 MIL/uL — ABNORMAL LOW (ref 3.87–5.11)
RDW: 15.6 % — ABNORMAL HIGH (ref 11.5–15.5)
WBC: 11.3 10*3/uL — ABNORMAL HIGH (ref 4.0–10.5)
nRBC: 0 % (ref 0.0–0.2)

## 2019-09-08 MED ORDER — FUROSEMIDE 10 MG/ML IJ SOLN
40.0000 mg | Freq: Every day | INTRAMUSCULAR | Status: AC
  Administered 2019-09-08 – 2019-09-10 (×3): 40 mg via INTRAVENOUS
  Filled 2019-09-08 (×3): qty 4

## 2019-09-08 MED ORDER — POTASSIUM CHLORIDE CRYS ER 20 MEQ PO TBCR
20.0000 meq | EXTENDED_RELEASE_TABLET | ORAL | Status: AC
  Administered 2019-09-08 (×3): 20 meq via ORAL
  Filled 2019-09-08 (×3): qty 1

## 2019-09-08 NOTE — Progress Notes (Addendum)
TCTS DAILY ICU PROGRESS NOTE                   [address]            [place] [address]          [phone]   4 Days Post-Op Procedure(s) (LRB): CORONARY ARTERY BYPASS GRAFTING (CABG), ON PUMP, TIMES THREE, LIMA TO LAD, SVG TO PDA, SVG TO OM 2 (N/A) TRANSESOPHAGEAL ECHOCARDIOGRAM (TEE) (N/A) ENDOVEIN HARVEST OF GREATER SAPHENOUS VEIN (Right)  Total Length of Stay:  LOS: 12 days   Subjective: Feels okay this morning. Diarrhea is better. Pain is well controlled.   Objective: Vital signs in last 24 hours: Temp:  [97.7 F (36.5 C)-98.5 F (36.9 C)] 98.2 F (36.8 C) (07/20 0743) Pulse Rate:  [56-83] 56 (07/20 0738) Cardiac Rhythm: Normal sinus rhythm (07/20 0745) Resp:  [9-22] 15 (07/20 0738) BP: (107-141)/(42-73) 116/53 (07/20 0738) SpO2:  [90 %-99 %] 98 % (07/20 0738) Weight:  [84.5 kg] 84.5 kg (07/20 0500)  Filed Weights   09/06/19 0500 09/07/19 0500 09/08/19 0500  Weight: 86 kg 87.7 kg 84.5 kg    Weight change: -3.2 kg   Hemodynamic parameters for last 24 hours:    Intake/Output from previous day: 07/19 0701 - 07/20 0700 In: 120 [P.O.:120] Out: 500 [Urine:500]  Intake/Output this shift: No intake/output data recorded.  Current Meds: Scheduled Meds: . acetaminophen  1,000 mg Oral Q6H  . aspirin EC  325 mg Oral Daily  . bisacodyl  10 mg Oral Daily   Or  . bisacodyl  10 mg Rectal Daily  . chlorhexidine gluconate (MEDLINE KIT)  15 mL Mouth Rinse BID  . Chlorhexidine Gluconate Cloth  6 each Topical Daily  . docusate sodium  200 mg Oral Daily  . enoxaparin (LOVENOX) injection  30 mg Subcutaneous QHS  . furosemide  40 mg Intravenous Daily  . levothyroxine  50 mcg Oral Q0600  . metolazone  5 mg Oral Daily  . pantoprazole  40 mg Oral Daily  . potassium chloride  20 mEq Oral Q4H  . rosuvastatin  5 mg Oral q1800  . sodium chloride flush  10-40 mL Intracatheter Q12H  . sodium chloride flush  10-40 mL Intracatheter Q12H  . sodium chloride  flush  3 mL Intravenous Q12H   Continuous Infusions: . sodium chloride Stopped (09/05/19 0415)   PRN Meds:.loperamide, ondansetron (ZOFRAN) IV, oxyCODONE, sodium chloride flush, sodium chloride flush, traMADol  General appearance: alert, cooperative and no distress Heart: regular rate and rhythm, S1, S2 normal, no murmur, click, rub or gallop Lungs: clear to auscultation bilaterally Abdomen: soft, non-tender; bowel sounds normal; no masses,  no organomegaly Extremities: extremities normal, atraumatic, no cyanosis or edema Wound: clean and dry. Some ecchymosis on bilateral legs likely due to [facility]  Lab Results: CBC: Recent Labs    09/07/19 0409 09/08/19 0242  WBC 10.3 11.3*  HGB 9.2* 9.4*  HCT 29.4* 29.6*  PLT 115* 139*   BMET:  Recent Labs    09/07/19 0409 09/08/19 0242  NA 139 139  K 4.6 3.4*  CL 108 103  CO2 24 26  GLUCOSE 135* 85  BUN 19 22  CREATININE 0.90 1.02*  CALCIUM 8.5* 8.6*    CMET: Lab Results  Component Value Date   WBC 11.3 (H) 09/08/2019   HGB 9.4 (L) 09/08/2019   HCT 29.6 (L) 09/08/2019   PLT 139 (L) 09/08/2019   GLUCOSE 85 09/08/2019   CHOL 178  08/27/2019   TRIG 206 (H) 08/27/2019   HDL 35 (L) 08/27/2019   LDLCALC 102 (H) 08/27/2019   NA 139 09/08/2019   K 3.4 (L) 09/08/2019   CL 103 09/08/2019   CREATININE 1.02 (H) 09/08/2019   BUN 22 09/08/2019   CO2 26 09/08/2019   TSH 3.269 08/28/2019   INR 1.4 (H) 09/04/2019   HGBA1C 5.9 (H) 09/01/2019      PT/INR: No results for input(s): LABPROT, INR in the last 72 hours. Radiology: [facility] Chest Port 1 View  Result Date: 09/08/2019 CLINICAL DATA:  Status post CABG. EXAM: PORTABLE CHEST 1 VIEW COMPARISON:  09/07/2019 FINDINGS: Sequelae of CABG are again identified. The right jugular sheath has been removed. The cardiomediastinal silhouette is unchanged with mild cardiomegaly. There is increased, mild pulmonary vascular congestion. Bibasilar airspace opacities and small pleural effusions are similar  to the prior study. No pneumothorax is identified. IMPRESSION: Pulmonary vascular congestion, bibasilar atelectasis, and small pleural effusions. Electronically Signed   By: Logan Bores M.D.   On: 09/08/2019 09:04     Assessment/Plan: S/P Procedure(s) (LRB): CORONARY ARTERY BYPASS GRAFTING (CABG), ON PUMP, TIMES THREE, LIMA TO LAD, SVG TO PDA, SVG TO OM 2 (N/A) TRANSESOPHAGEAL ECHOCARDIOGRAM (TEE) (N/A) ENDOVEIN HARVEST OF GREATER SAPHENOUS VEIN (Right)  1. CV-NSR in the 70s, on back-up rate of 56 on the pacer box. BP has been stable. Avoiding nodal agents. Continue ASA, and statin.  2. Pulm-On 1L [place] with good oxygen saturation. Encouraged incentive spirometer. CXR stable with small bilateral pleural effusions and some bibasilar atelectasis.  3. Renal- creatinine 1.02, hypokalemia-potassium replacement ordered. Continue IV lasix for fluid overload. Remains about 1.5kg over baseline.  4. H and H 9.4/29.6, expected acute blood loss anemia 5. Endo-blood glucose well controlled.   Plan: Doing well today. Possible transfer to [facility] today if bed available. Continue to wean oxygen as tolerated. She has not walked in the halls per the patient so this should be done today. OT recommending CIR. Diarrhea improved with Imodium dose yesterday.    Bonnie Hancock 09/08/2019 10:22 AM   HR is slowly increasing- today NSR 70. Holding lopressor for now. progressing slowly but is ready for tx to 2C while CIR evaluation is ongoing Keep EPWs for now until the bradycardia resolves patient examined and medical record reviewed,agree with above note. Bonnie Hancock 09/08/2019

## 2019-09-08 NOTE — Plan of Care (Signed)

## 2019-09-08 NOTE — Progress Notes (Signed)
Inpatient Rehab Admissions Coordinator:   Met with pt again at bedside today.  She is now agreeable to inpatient rehab, if insurance will approve.  I will speak to husband and start insurance auth process.    Shann Medal, PT, DPT Admissions Coordinator [phone] 09/08/19  3:01 PM

## 2019-09-08 NOTE — Evaluation (Signed)
Occupational Therapy Evaluation Patient Details Name: Bonnie Hancock MRN: [id number] DOB: Jul 10, 1940 Today's Date: 09/08/2019    History of Present Illness Patient is an 79 y/o female admitted 08/26/19 with NSTEMI, had cath 7/8 and found to have blockages, underwent CABG x 3 on 09/04/19.  PMH positive for obesity, HLD, hypothyroidism, and arthritis   Clinical Impression   Patient is s/p CABG x3 surgery resulting in functional limitations due to the deficits listed below (see OT problem list). Pt able to complete basic sit<>stand Min (A) with RW. Pt needs cues for sternal precautions throughout session due to poor recall.  Patient will benefit from skilled OT acutely to increase independence and safety with ADLS to allow discharge CIR.     Follow Up Recommendations  CIR    Equipment Recommendations  3 in 1 bedside commode    Recommendations for Other Services Rehab consult     Precautions / Restrictions Precautions Precautions: Fall;Sternal Precaution Comments: external pacemaker present Restrictions Weight Bearing Restrictions: Yes (sternal precautions)      Mobility Bed Mobility               General bed mobility comments: up in chair on arrival  Transfers Overall transfer level: Needs assistance Equipment used: Rolling walker (2 wheeled) Transfers: Sit to/from Stand Sit to Stand: Min assist         General transfer comment: cues for hand placement and not to pull on RW    Balance                                           ADL either performed or assessed with clinical judgement   ADL Overall ADL's : Needs assistance/impaired Eating/Feeding: Set up;Sitting   Grooming: Minimal assistance;Standing   Upper Body Bathing: Min guard;Sitting   Lower Body Bathing: Moderate assistance;Sit to/from stand         Lower Body Dressing Details (indicate cue type and reason): pt is able to figure 4 cross bil LE with reaching foot on R foot. pt is  unable to reach L foot so will need further education on use of reacher Toilet Transfer: Minimal assistance;RW Toilet Transfer Details (indicate cue type and reason): simulated with sit <>stand from chair           General ADL Comments: sit<>Stand from chair 1 minute test - 5 sit<> stand reps with rest breaks     Vision Baseline Vision/History: Wears glasses Wears Glasses: At all times       Perception     Praxis      Pertinent Vitals/Pain Pain Assessment: No/denies pain     Hand Dominance Right   Extremity/Trunk Assessment Upper Extremity Assessment Upper Extremity Assessment: Overall WFL for tasks assessed (not assessed past 90 degrees due to CABG)   Lower Extremity Assessment Lower Extremity Assessment: Defer to PT evaluation   Cervical / Trunk Assessment Cervical / Trunk Assessment: Kyphotic   Communication Communication Communication: No difficulties   Cognition Arousal/Alertness: Awake/alert Behavior During Therapy: Flat affect Overall Cognitive Status: Impaired/Different from baseline Area of Impairment: Orientation;Memory;Following commands;Safety/judgement;Awareness                 Orientation Level: Disoriented to;Situation Current Attention Level: Selective Memory: Decreased recall of precautions;Decreased short-term memory Following Commands: Follows one step commands consistently;Follows multi-step commands with increased time Safety/Judgement: Decreased awareness of safety;Decreased awareness of deficits   Problem  Solving: Slow processing General Comments: pt no recall of sternal precautions at this time. pt states "if they told me i forgot it"   General Comments  2L [place] with external pacer present    Exercises     Shoulder Instructions      Home Living Family/patient expects to be discharged to:: Private residence Living Arrangements: Spouse/significant other Available Help at Discharge: Available PRN/intermittently Type of Home:  House Home Access: Stairs to enter [employer] of Steps: 3 Entrance Stairs-Rails: Left Home Layout: One level     Bathroom Shower/Tub: Teacher, early years/pre: [place]: Environmental consultant - 2 wheels;Adaptive equipment Adaptive Equipment: Reacher Additional Comments: spouse has had walkers/ normally cares for two yorkies ( Mia/ [employer])      Prior Functioning/Environment Level of Independence: Independent        Comments: normally does all the cooking and cleaning at home. Pt reports she does not drive but they go out to eat for several meals a week        OT Problem List: Decreased strength;Decreased activity tolerance;Impaired balance (sitting and/or standing);Decreased cognition;Decreased safety awareness;Decreased knowledge of precautions;Decreased knowledge of use of DME or AE;Obesity      OT Treatment/Interventions: Self-care/ADL training;Therapeutic exercise;Energy conservation;DME and/or AE instruction;Therapeutic activities;Cognitive remediation/compensation;Patient/family education;Balance training    OT Goals(Current goals can be found in the care plan section) Acute Rehab OT Goals Patient Stated Goal: to return home OT Goal Formulation: With patient Time For Goal Achievement: 09/22/19 Potential to Achieve Goals: Good  OT Frequency: Min 3X/week   Barriers to D/C:    2 daughters and sister that can help. reports one daughter could stay with her if needed. the other two family members can come daily. Spouse can be present 24/7       Co-evaluation              AM-PAC OT "6 Clicks" Daily Activity     Outcome Measure Help from another person eating meals?: A Little Help from another person taking care of personal grooming?: A Little Help from another person toileting, which includes using toliet, bedpan, or urinal?: A Lot Help from another person bathing (including washing, rinsing, drying)?: A Lot Help from another person to put  on and taking off regular upper body clothing?: A Little Help from another person to put on and taking off regular lower body clothing?: A Lot 6 Click Score: 15   End of Session Equipment Utilized During Treatment: Rolling walker;Oxygen Nurse Communication: Mobility status;Precautions  Activity Tolerance: Patient tolerated treatment well Patient left: in chair;with call bell/phone within reach  OT Visit Diagnosis: Unsteadiness on feet (R26.81);Muscle weakness (generalized) (M62.81)                Time: 4540-9811 OT Time Calculation (min): 18 min Charges:  OT General Charges $OT Visit: 1 Visit OT Evaluation $OT Eval Moderate Complexity: 1 Mod   Brynn, OTR/L  Acute Rehabilitation Services Pager: [phone] Office: [phone] .   Jeri Modena 09/08/2019, 9:59 AM

## 2019-09-09 ENCOUNTER — Inpatient Hospital Stay (HOSPITAL_COMMUNITY): Payer: PPO

## 2019-09-09 LAB — GLUCOSE, CAPILLARY
Glucose-Capillary: 96 mg/dL (ref 70–99)
Glucose-Capillary: 104 mg/dL — ABNORMAL HIGH (ref 70–99)
Glucose-Capillary: 105 mg/dL — ABNORMAL HIGH (ref 70–99)
Glucose-Capillary: 135 mg/dL — ABNORMAL HIGH (ref 70–99)

## 2019-09-09 LAB — CBC
HCT: 29.4 % — ABNORMAL LOW (ref 36.0–46.0)
Hemoglobin: 9.5 g/dL — ABNORMAL LOW (ref 12.0–15.0)
MCH: 30.1 pg (ref 26.0–34.0)
MCHC: 32.3 g/dL (ref 30.0–36.0)
MCV: 93 fL (ref 80.0–100.0)
Platelets: 178 10*3/uL (ref 150–400)
RBC: 3.16 MIL/uL — ABNORMAL LOW (ref 3.87–5.11)
RDW: 15.5 % (ref 11.5–15.5)
WBC: 9.3 10*3/uL (ref 4.0–10.5)
nRBC: 0 % (ref 0.0–0.2)

## 2019-09-09 LAB — BASIC METABOLIC PANEL
Anion gap: 12 (ref 5–15)
BUN: 23 mg/dL (ref 8–23)
CO2: 22 mmol/L (ref 22–32)
Calcium: 8.6 mg/dL — ABNORMAL LOW (ref 8.9–10.3)
Chloride: 104 mmol/L (ref 98–111)
Creatinine, Ser: 1.03 mg/dL — ABNORMAL HIGH (ref 0.44–1.00)
GFR calc Af Amer: 59 mL/min — ABNORMAL LOW (ref >60)
GFR calc non Af Amer: 51 mL/min — ABNORMAL LOW (ref >60)
Glucose, Bld: 103 mg/dL — ABNORMAL HIGH (ref 70–99)
Potassium: 3.9 mmol/L (ref 3.5–5.1)
Sodium: 138 mmol/L (ref 135–145)

## 2019-09-09 NOTE — Progress Notes (Signed)
Inpatient Rehab Admissions Coordinator:   Spoke to pt's spouse on the phone to provide insurance benefits.  He feels they cannot afford CIR and with pt's current level of function he feels they can manage at home with home health therapy.  Will sign off for CIR at this time.  I will let TOC team know.   Shann Medal, PT, DPT Admissions Coordinator [phone] 09/09/19  1:29 PM

## 2019-09-09 NOTE — Plan of Care (Signed)
  Problem: Education: Goal: Knowledge of General Education information will improve Description: Including pain rating scale, medication(s)/side effects and non-pharmacologic comfort measures Outcome: Progressing   Problem: Clinical Measurements: Goal: Diagnostic test results will improve Outcome: Progressing Goal: Cardiovascular complication will be avoided Outcome: Progressing   Problem: Nutrition: Goal: Adequate nutrition will be maintained Outcome: Progressing   Problem: Coping: Goal: Level of anxiety will decrease Outcome: Progressing   Problem: Pain Managment: Goal: General experience of comfort will improve Outcome: Progressing   

## 2019-09-09 NOTE — Plan of Care (Signed)

## 2019-09-09 NOTE — TOC Transition Note (Signed)
Transition of Care [facility]) - CM/SW Discharge Note   Patient Details  Name: NOORAH GIAMMONA MRN: [id number] Date of Birth: [birth year]  Transition of Care [facility]) CM/SW Contact:  Zenon Mayo, RN Phone Number: 09/09/2019, 4:51 PM   Clinical Narrative:    NCM offered choice, she states Alvis Lemmings is ok with her. NCM made referral to [facility] ,  He is able to take referral for [facility] [place]. Soc will begin 24 to 48 hrs post dc.  She will also need a 3 n 1, referral made to [facility] with [employer]. This will be delivered to room prior to dc.   Final next level of care: [place] Barriers to Discharge: Continued Medical Work up   Patient Goals and CMS Choice Patient states their goals for this hospitalization and ongoing recovery are:: to get better CMS Medicare.gov Compare Post Acute Care list provided to:: Patient Choice offered to / list presented to : Patient  Discharge Placement                       Discharge Plan and Services                DME Arranged: 3-N-1 DME Agency: [employer] Date DME Agency Contacted: 09/09/19 Time DME Agency Contacted: [phone] Representative spoke with at DME Agency: Thedore Mins HH Arranged: RN, PT [place] Agency: [place] Date [place]: 09/09/19 Time [place]: 1651 Representative spoke with at [place]: [place] ([place]) Interventions     Readmission Risk Interventions No flowsheet data found.

## 2019-09-09 NOTE — Progress Notes (Addendum)
      [address]       [place] [address]             [phone]      5 Days Post-Op Procedure(s) (LRB): CORONARY ARTERY BYPASS GRAFTING (CABG), ON PUMP, TIMES THREE, LIMA TO LAD, SVG TO PDA, SVG TO OM 2 (N/A) TRANSESOPHAGEAL ECHOCARDIOGRAM (TEE) (N/A) ENDOVEIN HARVEST OF GREATER SAPHENOUS VEIN (Right) Subjective: Doing well. She is getting around okay on her own.   Objective: Vital signs in last 24 hours: Temp:  [98.1 F (36.7 C)-99.3 F (37.4 C)] 98.8 F (37.1 C) (07/21 0758) Pulse Rate:  [62-72] 71 (07/21 0758) Cardiac Rhythm: Normal sinus rhythm (07/21 0702) Resp:  [12-26] 19 (07/21 0758) BP: (99-130)/(55-92) 130/67 (07/21 0758) SpO2:  [90 %-97 %] 90 % (07/21 0758) Weight:  [83.2 kg] 83.2 kg (07/21 0416)     Intake/Output from previous day: 07/20 0701 - 07/21 0700 In: 253 [P.O.:240; I.V.:13] Out: 1250 [Urine:1250] Intake/Output this shift: No intake/output data recorded.  General appearance: alert, cooperative and no distress Heart: regular rate and rhythm, S1, S2 normal, no murmur, click, rub or gallop Lungs: clear to auscultation bilaterally Abdomen: soft, non-tender; bowel sounds normal; no masses,  no organomegaly Extremities: extremities normal, atraumatic, no cyanosis or edema Wound: clean and dry  Lab Results: Recent Labs    09/08/19 0242 09/09/19 0217  WBC 11.3* 9.3  HGB 9.4* 9.5*  HCT 29.6* 29.4*  PLT 139* 178   BMET:  Recent Labs    09/08/19 0242 09/09/19 0217  NA 139 138  K 3.4* 3.9  CL 103 104  CO2 26 22  GLUCOSE 85 103*  BUN 22 23  CREATININE 1.02* 1.03*  CALCIUM 8.6* 8.6*    PT/INR: No results for input(s): LABPROT, INR in the last 72 hours. ABG    Component Value Date/Time   PHART 7.393 09/05/2019 1210   HCO3 22.7 09/05/2019 1210   TCO2 24 09/05/2019 1210   ACIDBASEDEF 2.0 09/05/2019 1210   O2SAT 74.6 09/06/2019 0432   CBG (last 3)  Recent Labs    09/08/19 1633 09/08/19 2113 09/09/19 0612    GLUCAP 114* 106* 96    Assessment/Plan: S/P Procedure(s) (LRB): CORONARY ARTERY BYPASS GRAFTING (CABG), ON PUMP, TIMES THREE, LIMA TO LAD, SVG TO PDA, SVG TO OM 2 (N/A) TRANSESOPHAGEAL ECHOCARDIOGRAM (TEE) (N/A) ENDOVEIN HARVEST OF GREATER SAPHENOUS VEIN (Right)  1. CV-NSR in the 70s, BP well controlled. Will keep pacing wires one more day.  Continue ASA, and statin.  2. Pulm-Tolerating room air. Encouraged incentive spirometer. CXR stable with small bilateral pleural effusions and some bibasilar atelectasis.  3. Renal- creatinine 1.03, Electrolytes okay. Continue IV lasix for once more day. She is close to her baseline weight.  4. H and H 9.5/29.4, expected acute blood loss anemia 5. Endo-blood glucose well controlled.    Plan: Keep EPW one more day while rhythm continues to recover. Hopefully /home  in the next 1-2 days. She has lots of help at home.  Patient has declined CIR placement    LOS: 13 days    Elgie Collard 09/09/2019  Hold beta blocker due to low HR and keep EPWs in place CXR today is satisfactory We will place face-to-face for home physical therapy and home restorative care by RN patient examined and medical record reviewed,agree with above note. Tharon Aquas Trigt III 09/09/2019

## 2019-09-09 NOTE — Progress Notes (Signed)
Physical Therapy Treatment Patient Details Name: Bonnie Hancock MRN: [id number] DOB: [birth year] Today's Date: 09/09/2019    History of Present Illness Pt is an 79 y.o. female admitted 08/27/19 with NSTEMI. Cath showed blockages. 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 PMH includes obesity, HLD, arthritis.   PT Comments    Pt progressing well with mobility. Able to transfer and increase ambulation distance with RW, requiring intermittent minA to prevent LOB with turning. Cues for sternal precaution and safety with RW. Sister present and supportive. Discussed plans to d/c home, including home set-up, DME needs and fall risk reduction; will have appropriate family support. Recommend follow-up with HHPT services.    Follow Up Recommendations  Home health PT;Supervision for mobility/OOB (declined CIR/SNF)     Equipment Recommendations  3in1 (PT) (already owns RW)    Recommendations for Other Services       Precautions / Restrictions Precautions Precautions: Fall;Sternal    Mobility  Bed Mobility               General bed mobility comments: Sitting EOB upon arrival  Transfers Overall transfer level: Needs assistance Equipment used: None Transfers: Sit to/from Stand Sit to Stand: Supervision            Ambulation/Gait Ambulation/Gait assistance: Min guard;Min assist Gait Distance (Feet): 485 Feet Assistive device: Rolling walker (2 wheeled) Gait Pattern/deviations: Step-through pattern;Decreased stride length;Trunk flexed   Gait velocity interpretation: 1.31 - 2.62 ft/sec, indicative of limited community ambulator General Gait Details: Mostly steady gait with RW and min guard for balance; 2x LOB with initial turns requiring minA to correct, did not occur again on subsequent turns. Pt able to speed up gait with cues. Cues to maintain closer proximity to [employer]    Modified Rankin (Stroke Patients Only)       Balance Overall balance  assessment: Needs assistance Sitting-balance support: Feet supported;No upper extremity supported Sitting balance-Leahy Scale: Good       Standing balance-Leahy Scale: Fair Standing balance comment: Can static stand and take steps without UE support                            Cognition Arousal/Alertness: Awake/alert Behavior During Therapy: WFL for tasks assessed/performed Overall Cognitive Status: Impaired/Different from baseline Area of Impairment: Attention;Memory;Safety/judgement;Problem solving                   Current Attention Level: Selective Memory: Decreased short-term memory   Safety/Judgement: Decreased awareness of safety;Decreased awareness of deficits   Problem Solving: Slow processing General Comments: Suspect apparent cognitive impairments exacerbated by [facility]      Exercises      General Comments General comments (skin integrity, edema, etc.): Sister present and supportive. Discussed home set-up, DME needs and fall risk reduction      Pertinent Vitals/Pain Pain Assessment: No/denies pain Pain Intervention(s): Monitored during session    Home Living                      Prior Function            PT Goals (current goals can now be found in the care plan section) Progress towards PT goals: Progressing toward goals    Frequency    Min 3X/week      PT Plan Discharge plan needs to be updated    Co-evaluation  AM-PAC PT "6 Clicks" Mobility   Outcome Measure  Help needed turning from your back to your side while in a flat bed without using bedrails?: A Little Help needed moving from lying on your back to sitting on the side of a flat bed without using bedrails?: A Little Help needed moving to and from a bed to a chair (including a wheelchair)?: None Help needed standing up from a chair using your arms (e.g., wheelchair or bedside chair)?: None Help needed to walk in hospital room?: A Little Help  needed climbing 3-5 steps with a railing? : A Little 6 Click Score: 20    End of Session Equipment Utilized During Treatment: Gait belt Activity Tolerance: Patient tolerated treatment well Patient left: in chair;with call bell/phone within reach;with family/visitor present Nurse Communication: Mobility status PT Visit Diagnosis: Other abnormalities of gait and mobility (R26.89);Muscle weakness (generalized) (M62.81);Difficulty in walking, not elsewhere classified (R26.2)     Time: 1021-1173 PT Time Calculation (min) (ACUTE ONLY): 18 min  Charges:  $Gait Training: 8-22 mins                     Mabeline Caras, PT, DPT Acute Rehabilitation Services  Pager [phone] Office [place] 09/09/2019, 5:27 PM

## 2019-09-09 NOTE — Progress Notes (Signed)
CARDIAC REHAB PHASE I   PRE:  Rate/Rhythm: 68 SR  BP:  Supine: 118/56  Sitting:   Standing:    SaO2: 93%RA  MODE:  Ambulation: 280 ft   POST:  Rate/Rhythm: 98 SR  BP:  Supine:   Sitting: 122/49  Standing:    SaO2: 98%RA 1320-1340 Pt assisted to bathroom and then walked 280 ft on RA with gait belt use and rolling walker. Encouraged to stay close to walker. Reinforced sternal precautions. Encouraged pt to call if she needs to get OOB. Bed alarm on.  Call bell in reach.   Graylon Good, RN BSN  09/09/2019 1:36 PM

## 2019-09-10 LAB — GLUCOSE, CAPILLARY
Glucose-Capillary: 95 mg/dL (ref 70–99)
Glucose-Capillary: 97 mg/dL (ref 70–99)
Glucose-Capillary: 166 mg/dL — ABNORMAL HIGH (ref 70–99)
Glucose-Capillary: 110 mg/dL — ABNORMAL HIGH (ref 70–99)

## 2019-09-10 LAB — BASIC METABOLIC PANEL
Anion gap: 11 (ref 5–15)
BUN: 18 mg/dL (ref 8–23)
CO2: 25 mmol/L (ref 22–32)
Calcium: 8.3 mg/dL — ABNORMAL LOW (ref 8.9–10.3)
Chloride: 98 mmol/L (ref 98–111)
Creatinine, Ser: 0.9 mg/dL (ref 0.44–1.00)
GFR calc Af Amer: >60 mL/min (ref >60)
GFR calc non Af Amer: >60 mL/min (ref >60)
Glucose, Bld: 92 mg/dL (ref 70–99)
Potassium: 3.4 mmol/L — ABNORMAL LOW (ref 3.5–5.1)
Sodium: 134 mmol/L — ABNORMAL LOW (ref 135–145)

## 2019-09-10 LAB — CBC
HCT: 29.6 % — ABNORMAL LOW (ref 36.0–46.0)
Hemoglobin: 9.7 g/dL — ABNORMAL LOW (ref 12.0–15.0)
MCH: 30.5 pg (ref 26.0–34.0)
MCHC: 32.8 g/dL (ref 30.0–36.0)
MCV: 93.1 fL (ref 80.0–100.0)
Platelets: 238 10*3/uL (ref 150–400)
RBC: 3.18 MIL/uL — ABNORMAL LOW (ref 3.87–5.11)
RDW: 15.4 % (ref 11.5–15.5)
WBC: 9.5 10*3/uL (ref 4.0–10.5)
nRBC: 0 % (ref 0.0–0.2)

## 2019-09-10 MED ORDER — ACETAMINOPHEN 325 MG PO TABS
650.0000 mg | ORAL_TABLET | Freq: Four times a day (QID) | ORAL | Status: DC | PRN
  Administered 2019-09-10: 650 mg via ORAL
  Filled 2019-09-10: qty 2

## 2019-09-10 MED ORDER — POTASSIUM CHLORIDE CRYS ER 20 MEQ PO TBCR
20.0000 meq | EXTENDED_RELEASE_TABLET | Freq: Three times a day (TID) | ORAL | Status: AC
  Administered 2019-09-10 (×3): 20 meq via ORAL
  Filled 2019-09-10 (×3): qty 1

## 2019-09-10 MED FILL — Albumin, Human Inj 5%: INTRAVENOUS | Qty: 250 | Status: AC

## 2019-09-10 MED FILL — Sodium Bicarbonate IV Soln 8.4%: INTRAVENOUS | Qty: 50 | Status: AC

## 2019-09-10 MED FILL — Heparin Sodium (Porcine) Inj 1000 Unit/ML: INTRAMUSCULAR | Qty: 30 | Status: AC

## 2019-09-10 MED FILL — Electrolyte-R (PH 7.4) Solution: INTRAVENOUS | Qty: 4000 | Status: AC

## 2019-09-10 MED FILL — Mannitol IV Soln 20%: INTRAVENOUS | Qty: 500 | Status: AC

## 2019-09-10 MED FILL — Calcium Chloride Inj 10%: INTRAVENOUS | Qty: 10 | Status: AC

## 2019-09-10 MED FILL — Lidocaine HCl Local Soln Prefilled Syringe 100 MG/5ML (2%): INTRAMUSCULAR | Qty: 5 | Status: AC

## 2019-09-10 MED FILL — Sodium Chloride IV Soln 0.9%: INTRAVENOUS | Qty: 2000 | Status: AC

## 2019-09-10 NOTE — Progress Notes (Signed)
RN placed patient in supine position. Pt practiced deep breathing. Epicardial wires removed. Pt tolerated well. Site covered with gauze and tape. Pt educated on lying in bed for 1 hour post wire removal. Bedrest began at 4:35. VSS recorded during bedrest.  RN transferred patient to chair. Pt tolerated well. VSS stable. RN will continue to monitor.

## 2019-09-10 NOTE — Progress Notes (Signed)
Occupational Therapy Treatment Patient Details Name: Bonnie Hancock MRN: [id number] DOB: [birth year] Today's Date: 09/10/2019    History of present illness Pt is an 79 y.o. female admitted 08/27/19 with NSTEMI. Cath showed blockages. 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 PMH includes obesity, HLD, arthritis.   OT comments  Pt pleasant and cooperative throughout OT session, making good progress towards OT goals. Pt on RA throughout session. Pt continues to require mod verbal cues for sternal precautions during functional ADL transfers and sink level (with seated rest breaks) ADL. Please see ADL section below for more details. Updated discharge from CIR to [facility] with 24 hour supervision. It is essential for safe recovery/fall prevention/re-admission prevention that she get HHOT. She will have 24 hour assist from her family. OT will continue to follow acutely and should continue focus on sternal precautions during functional ADL and RW safety.   Follow Up Recommendations  Home health OT;Supervision/Assistance - 24 hour    Equipment Recommendations  3 in 1 bedside commode    Recommendations for Other Services      Precautions / Restrictions Precautions Precautions: Fall;Sternal Restrictions Weight Bearing Restrictions: Yes (STERNAL)       Mobility Bed Mobility               General bed mobility comments: OOB in recliner at beginning and end of session  Transfers Overall transfer level: Needs assistance Equipment used: Rolling walker (2 wheeled) Transfers: Sit to/from Stand Sit to Stand: Min guard         General transfer comment: cues for hand placement and not to pull on RW    Balance Overall balance assessment: Needs assistance Sitting-balance support: Feet supported;No upper extremity supported Sitting balance-Leahy Scale: Good     Standing balance support: Single extremity supported;Bilateral upper extremity supported;During functional activity Standing balance-Leahy Scale:  Fair Standing balance comment: Can static stand and take steps without UE support                           ADL either performed or assessed with clinical judgement   ADL Overall ADL's : Needs assistance/impaired     Grooming: Wash/dry face;Oral care;Min guard;Cueing for safety;Sitting;Standing Grooming Details (indicate cue type and reason): sit<>stand at sink (for energy conservation) min cues for sternal precautions and "in the tube" terminology used Upper Body Bathing: Sitting;Minimal assistance Upper Body Bathing Details (indicate cue type and reason): sitting at sink, assist for back, reenforcing "in the tube" terminology Lower Body Bathing: Min guard;Sitting/lateral leans Lower Body Bathing Details (indicate cue type and reason): sitting at sink Upper Body Dressing : Minimal assistance;Sitting Upper Body Dressing Details (indicate cue type and reason): for hospital gown, cues for sternal precautions     Toilet Transfer: Min guard;Ambulation;RW Toilet Transfer Details (indicate cue type and reason): vc for safe hand placement with transfers   Toileting - Clothing Manipulation Details (indicate cue type and reason): educated on STERNAL precautions with wiping bottom, Pt states that her family will assist her     Functional mobility during ADLs: Min guard;Rolling walker General ADL Comments: re-enforced sternal precautions throughout session     Vision       Perception     Praxis      Cognition Arousal/Alertness: Awake/alert Behavior During Therapy: WFL for tasks assessed/performed Overall Cognitive Status: Impaired/Different from baseline Area of Impairment: Memory;Safety/judgement  Memory: Decreased recall of precautions;Decreased short-term memory   Safety/Judgement: Decreased awareness of safety;Decreased awareness of deficits     General Comments: sternal precaution safety deficits during funtional tasks         Exercises     Shoulder Instructions       General Comments Pt on RA throughout session    Pertinent Vitals/ Pain       Pain Assessment: No/denies pain Pain Intervention(s): Limited activity within patient's tolerance;Monitored during session;Repositioned  Home Living                                          Prior Functioning/Environment              Frequency  Min 3X/week        Progress Toward Goals  OT Goals(current goals can now be found in the care plan section)  Progress towards OT goals: Progressing toward goals  Acute Rehab OT Goals Patient Stated Goal: to return home to [facility] OT Goal Formulation: With patient Time For Goal Achievement: 09/22/19 Potential to Achieve Goals: Good  Plan Discharge plan needs to be updated;Frequency remains appropriate    Co-evaluation                 AM-PAC OT "6 Clicks" Daily Activity     Outcome Measure   Help from another person eating meals?: A Little Help from another person taking care of personal grooming?: A Little Help from another person toileting, which includes using toliet, bedpan, or urinal?: A Lot Help from another person bathing (including washing, rinsing, drying)?: A Lot Help from another person to put on and taking off regular upper body clothing?: A Little Help from another person to put on and taking off regular lower body clothing?: A Lot 6 Click Score: 15    End of Session Equipment Utilized During Treatment: Rolling walker  OT Visit Diagnosis: Unsteadiness on feet (R26.81);Muscle weakness (generalized) (M62.81)   Activity Tolerance Patient tolerated treatment well   Patient Left in chair;with call bell/phone within reach   Nurse Communication Mobility status;Precautions        Time: 1194-1740 OT Time Calculation (min): 33 min  Charges: OT General Charges $OT Visit: 1 Visit OT Treatments $Self Care/Home Management : 23-37 mins  Jesse Sans OTR/L Acute  Rehabilitation Services Pager: [phone] Office: [place] 09/10/2019, 9:50 AM

## 2019-09-10 NOTE — Progress Notes (Signed)
[id number] Checked with pt to walk. Pt declined due to headache. Just got tylenol. Gave pt IS and encouraged it after headache eases and encouraged her to walk with staff. Will educate pt tomorrow when husband here. Graylon Good RN BSN 09/10/2019 2:45 PM

## 2019-09-10 NOTE — Progress Notes (Addendum)
[address]       [place] [address]             [phone]       6 Days Post-Op Procedure(s) (LRB): CORONARY ARTERY BYPASS GRAFTING (CABG), ON PUMP, TIMES THREE, LIMA TO LAD, SVG TO PDA, SVG TO OM 2 (N/A) TRANSESOPHAGEAL ECHOCARDIOGRAM (TEE) (N/A) ENDOVEIN HARVEST OF GREATER SAPHENOUS VEIN (Right) Subjective: Out of bed , walking in her room this AM. Says she rested well and "ate a good breakfast".  No new concerns. She is anxious to return hoe tomorrow.   Objective: Vital signs in last 24 hours: Temp:  [98.1 F (36.7 C)-98.8 F (37.1 C)] 98.4 F (36.9 C) (07/22 0315) Pulse Rate:  [64-79] 79 (07/22 0315) Cardiac Rhythm: Normal sinus rhythm (07/22 0700) Resp:  [15-25] 20 (07/22 0315) BP: (109-136)/(48-67) 132/67 (07/22 0315) SpO2:  [91 %-96 %] 96 % (07/22 0315) Weight:  [82.1 kg] 82.1 kg (07/22 0311)    Intake/Output from previous day: 07/21 0701 - 07/22 0700 In: 600 [P.O.:600] Out: -  Intake/Output this shift: No intake/output data recorded.  General appearance: alert, cooperative and no distress Neurologic: intact Heart: regular rate and rhythm and SR 60's-70. No significant arrhythmias recorded on monitor. Lungs: Breath sounds are clear. O2 sats acceptable on RA. Extremities: All well perfused, mild RLE pretibial edema.  Wound: the sternotomy incision has expected bruising but is intact and dry.  the RLE EVH incision is covered with Dermabond and is dry.   Lab Results: Recent Labs    09/09/19 0217 09/10/19 0258  WBC 9.3 9.5  HGB 9.5* 9.7*  HCT 29.4* 29.6*  PLT 178 238   BMET:  Recent Labs    09/09/19 0217 09/10/19 0258  NA 138 134*  K 3.9 3.4*  CL 104 98  CO2 22 25  GLUCOSE 103* 92  BUN 23 18  CREATININE 1.03* 0.90  CALCIUM 8.6* 8.3*    PT/INR: No results for input(s): LABPROT, INR in the last 72 hours. ABG    Component Value Date/Time   PHART 7.393 09/05/2019 1210   HCO3 22.7 09/05/2019 1210   TCO2 24 09/05/2019  1210   ACIDBASEDEF 2.0 09/05/2019 1210   O2SAT 74.6 09/06/2019 0432   CBG (last 3)  Recent Labs    09/09/19 1656 09/09/19 2122 09/10/19 0635  GLUCAP 105* 135* 95    Assessment/Plan: S/P Procedure(s) (LRB): CORONARY ARTERY BYPASS GRAFTING (CABG), ON PUMP, TIMES THREE, LIMA TO LAD, SVG TO PDA, SVG TO OM 2 (N/A) TRANSESOPHAGEAL ECHOCARDIOGRAM (TEE) (N/A) ENDOVEIN HARVEST OF GREATER SAPHENOUS VEIN (Right)  -POD6 CABG x 3 after presenting with acute NSTEMI, pre-op EF 60%.  Progressing well, cardiac rhythm remains stable. Not on beta blocker due to bradycardia early post-op. Will remove pacer wires today.   -Expected post-op volume excess--Wt now ~1lb below pre-op. Has one more dose of scheduled IV lasix today.   --Hypokalemia- supplement with 26mEq po x 3 today.   --Dyslipidemia- Crestor resumed  -Hypothyroidism--back on levothyroxine.   -DVT PPX- continue ambulation and enoxaparin daily.  -Disposition- planning discharge to home tomorrow. CM arranging HHRN and 3-in-1.   LOS: 14 days    Malon Kindle [phone] 09/10/2019  patient says family has things ready at home Wants to go home tomorrow  I have seen and examined Federico Flake and agree with the above assessment  and plan.  Grace Isaac MD [place] [phone] Office [phone] 09/10/2019 6:30 PM

## 2019-09-11 LAB — BASIC METABOLIC PANEL
Anion gap: 14 (ref 5–15)
BUN: 17 mg/dL (ref 8–23)
CO2: 24 mmol/L (ref 22–32)
Calcium: 8.7 mg/dL — ABNORMAL LOW (ref 8.9–10.3)
Chloride: 100 mmol/L (ref 98–111)
Creatinine, Ser: 0.81 mg/dL (ref 0.44–1.00)
GFR calc Af Amer: >60 mL/min (ref >60)
GFR calc non Af Amer: >60 mL/min (ref >60)
Glucose, Bld: 101 mg/dL — ABNORMAL HIGH (ref 70–99)
Potassium: 3.6 mmol/L (ref 3.5–5.1)
Sodium: 138 mmol/L (ref 135–145)

## 2019-09-11 LAB — CBC
HCT: 32.5 % — ABNORMAL LOW (ref 36.0–46.0)
Hemoglobin: 10.1 g/dL — ABNORMAL LOW (ref 12.0–15.0)
MCH: 29.1 pg (ref 26.0–34.0)
MCHC: 31.1 g/dL (ref 30.0–36.0)
MCV: 93.7 fL (ref 80.0–100.0)
Platelets: 278 10*3/uL (ref 150–400)
RBC: 3.47 MIL/uL — ABNORMAL LOW (ref 3.87–5.11)
RDW: 15.7 % — ABNORMAL HIGH (ref 11.5–15.5)
WBC: 10.6 10*3/uL — ABNORMAL HIGH (ref 4.0–10.5)
nRBC: 0 % (ref 0.0–0.2)

## 2019-09-11 LAB — GLUCOSE, CAPILLARY
Glucose-Capillary: 104 mg/dL — ABNORMAL HIGH (ref 70–99)
Glucose-Capillary: 106 mg/dL — ABNORMAL HIGH (ref 70–99)

## 2019-09-11 MED ORDER — TRAMADOL HCL 50 MG PO TABS: 50.0000 mg | ORAL_TABLET | Freq: Four times a day (QID) | ORAL | 0 refills | Status: AC | PRN

## 2019-09-11 MED ORDER — CLOPIDOGREL BISULFATE 75 MG PO TABS
75.0000 mg | ORAL_TABLET | Freq: Every day | ORAL | 11 refills | Status: DC
Start: 2019-09-11 — End: 2019-10-14

## 2019-09-11 MED ORDER — ASPIRIN 325 MG PO TBEC: 325.0000 mg | DELAYED_RELEASE_TABLET | Freq: Every day | ORAL | 0 refills | Status: DC

## 2019-09-11 NOTE — Progress Notes (Signed)
      [address]       [place] [address]             [phone]       7 Days Post-Op Procedure(s) (LRB): CORONARY ARTERY BYPASS GRAFTING (CABG), ON PUMP, TIMES THREE, LIMA TO LAD, SVG TO PDA, SVG TO OM 2 (N/A) TRANSESOPHAGEAL ECHOCARDIOGRAM (TEE) (N/A) ENDOVEIN HARVEST OF GREATER SAPHENOUS VEIN (Right) Subjective: Sitting up eating breakfast.  No new concerns. Says she is ready to go home.  Using Tylenol for pain control.  Wt ~2Lbs below pre-op.   Objective: Vital signs in last 24 hours: Temp:  [97.8 F (36.6 C)-98.8 F (37.1 C)] 98.4 F (36.9 C) (07/23 0741) Pulse Rate:  [70-78] 76 (07/23 0741) Cardiac Rhythm: Normal sinus rhythm (07/23 0700) Resp:  [14-21] 14 (07/23 0741) BP: (83-137)/(42-98) 105/56 (07/23 0741) SpO2:  [92 %-100 %] 93 % (07/23 0741) Weight:  [81.8 kg] 81.8 kg (07/23 0625)  Intake/Output from previous day: 07/22 0701 - 07/23 0700 In: 480 [P.O.:480] Out: 350 [Urine:350] Intake/Output this shift: No intake/output data recorded.  General appearance: alert, cooperative and no distress Neurologic: intact Heart: regular rate and rhythm and SR ~70. No significant arrhythmias recorded on monitor. Lungs: Breath sounds are clear. O2 sats acceptable on RA. Extremities: All well perfused, mild RLE pretibial edema.  Wound: the sternotomy incision has expected bruising but is intact and dry.  the RLE EVH incision is covered with Dermabond and is dry.   Lab Results: Recent Labs    09/10/19 0258 09/11/19 0243  WBC 9.5 10.6*  HGB 9.7* 10.1*  HCT 29.6* 32.5*  PLT 238 278   BMET:  Recent Labs    09/10/19 0258 09/11/19 0243  NA 134* 138  K 3.4* 3.6  CL 98 100  CO2 25 24  GLUCOSE 92 101*  BUN 18 17  CREATININE 0.90 0.81  CALCIUM 8.3* 8.7*    PT/INR: No results for input(s): LABPROT, INR in the last 72 hours. ABG    Component Value Date/Time   PHART 7.393 09/05/2019 1210   HCO3 22.7 09/05/2019 1210   TCO2 24 09/05/2019 1210     ACIDBASEDEF 2.0 09/05/2019 1210   O2SAT 74.6 09/06/2019 0432   CBG (last 3)  Recent Labs    09/10/19 1629 09/10/19 2130 09/11/19 0618  GLUCAP 166* 110* 104*    Assessment/Plan: S/P Procedure(s) (LRB): CORONARY ARTERY BYPASS GRAFTING (CABG), ON PUMP, TIMES THREE, LIMA TO LAD, SVG TO PDA, SVG TO OM 2 (N/A) TRANSESOPHAGEAL ECHOCARDIOGRAM (TEE) (N/A) ENDOVEIN HARVEST OF GREATER SAPHENOUS VEIN (Right)  -POD7 CABG x 3 after presenting with acute NSTEMI, pre-op EF 60%.  Progressing well, cardiac rhythm remains stable. Not on beta blocker due to bradycardia early post-op. Pacer wires removed yesterday.   -Expected post-op volume excess--Wt now ~2lsb below pre-op. No need for further diuresis after discharge.   --Hypokalemia- corrected  --Dyslipidemia- Crestor resumed  -Hypothyroidism--back on levothyroxine.    -Disposition-  discharge to home today,  CM arranging HHRN and 3-in-1. Instructions given and follow up arranged. .    LOS: 15 days    Antony Odea, PA-C [phone] 09/11/2019

## 2019-09-11 NOTE — Progress Notes (Signed)
[id number] Education completed with pt and husband who voiced understanding. Discussed sternal precautions, IS,heart healthy food choices, walking for ex, wound care, and CRP 2.  Referral to [facility] CRP 2. Graylon Good RN BSN 09/11/2019 10:06 AM

## 2019-09-11 NOTE — TOC Transition Note (Signed)
Transition of Care [facility]) - CM/SW Discharge Note   Patient Details  Name: ALJEAN HORIUCHI MRN: [id number] Date of Birth: [birth year]  [facility] CM/SW Contact:  Zenon Mayo, RN Phone Number: 09/11/2019, 8:33 AM   Clinical Narrative:    NCM notified Tommi Rumps with [facility] of patient's dc today.     Final next level of care: [place] Barriers to Discharge: No Barriers Identified   Patient Goals and CMS Choice Patient states their goals for this hospitalization and ongoing recovery are:: to get better CMS Medicare.gov Compare Post Acute Care list provided to:: Patient Choice offered to / list presented to : Patient  Discharge Placement                       Discharge Plan and Services                DME Arranged: 3-N-1 DME Agency: [employer] Date DME Agency Contacted: 09/09/19 Time DME Agency Contacted: [phone] Representative spoke with at DME Agency: Thedore Mins HH Arranged: RN, PT [place] Agency: [place] Date [place]: 09/09/19 Time [place]: 1651 Representative spoke with at [place]: [place] ([place]) Interventions     Readmission Risk Interventions No flowsheet data found.

## 2019-09-11 NOTE — Progress Notes (Signed)
Physical Therapy Treatment Patient Details Name: Bonnie Hancock MRN: [id number] DOB: 07/19/1940 Today's Date: 09/11/2019    History of Present Illness Pt is an 79 y.o. female admitted 08/27/19 with NSTEMI. Cath showed blockages. 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 PMH includes obesity, HLD, arthritis.    PT Comments    Pt making good progress. Ready for DC home with family.    Follow Up Recommendations  Home health PT;Supervision for mobility/OOB     Equipment Recommendations  3in1 (PT) (delivered)    Recommendations for Other Services       Precautions / Restrictions Precautions Precautions: Fall;Sternal    Mobility  Bed Mobility               General bed mobility comments: Pt up in room with walker  Transfers Overall transfer level: Needs assistance Equipment used: Rolling walker (2 wheeled) Transfers: Sit to/from Stand Sit to Stand: Supervision         General transfer comment: Pt standing on arrival. Stand to sit with supervision  Ambulation/Gait Ambulation/Gait assistance: Supervision Gait Distance (Feet): 300 Feet Assistive device: Rolling walker (2 wheeled) Gait Pattern/deviations: Step-through pattern;Decreased stride length Gait velocity: decr Gait velocity interpretation: 1.31 - 2.62 ft/sec, indicative of limited community ambulator General Gait Details: Steady gait with walker   Stairs Stairs: Yes Stairs assistance: Min assist Stair Management: Step to pattern;Forwards (hand held) Number of Stairs: 1 General stair comments: Assist for stability   Wheelchair Mobility    Modified Rankin (Stroke Patients Only)       Balance Overall balance assessment: Needs assistance Sitting-balance support: Feet supported;No upper extremity supported Sitting balance-Leahy Scale: Good     Standing balance support: No upper extremity supported;During functional activity Standing balance-Leahy Scale: Fair                              Cognition  Arousal/Alertness: Awake/alert Behavior During Therapy: WFL for tasks assessed/performed Overall Cognitive Status: Impaired/Different from baseline Area of Impairment: Memory;Safety/judgement                     Memory: Decreased recall of precautions;Decreased short-term memory   Safety/Judgement: Decreased awareness of safety;Decreased awareness of deficits            Exercises      General Comments        Pertinent Vitals/Pain Pain Assessment: No/denies pain    Home Living                      Prior Function            PT Goals (current goals can now be found in the care plan section) Acute Rehab PT Goals Patient Stated Goal: to return home to [employer] Progress towards PT goals: Progressing toward goals    Frequency    Min 3X/week      PT Plan Current plan remains appropriate    Co-evaluation              AM-PAC PT "6 Clicks" Mobility   Outcome Measure  Help needed turning from your back to your side while in a flat bed without using bedrails?: A Little Help needed moving from lying on your back to sitting on the side of a flat bed without using bedrails?: A Little Help needed moving to and from a bed to a chair (including a wheelchair)?: None Help needed standing up from a chair using  your arms (e.g., wheelchair or bedside chair)?: None Help needed to walk in hospital room?: None Help needed climbing 3-5 steps with a railing? : A Little 6 Click Score: 21    End of Session   Activity Tolerance: Patient tolerated treatment well Patient left: in chair;with call bell/phone within reach Nurse Communication: Mobility status PT Visit Diagnosis: Other abnormalities of gait and mobility (R26.89);Muscle weakness (generalized) (M62.81);Difficulty in walking, not elsewhere classified (R26.2)     Time: 2836-6294 PT Time Calculation (min) (ACUTE ONLY): 9 min  Charges:  $Gait Training: 8-22 mins                     [place] Pager [phone] Office [place] 09/11/2019, 8:51 AM

## 2019-09-11 NOTE — Progress Notes (Signed)
RN provided pt with detailed verbal discharge instructions. Husband at bedside during discharge. RN re-educated patient on proper sternal precautions. 3 in 1 brought to beside. VSS at discharge. Midline removed per protocol..  Patient belongings and equipment sent with patient. RN discharge patient via wheelchair to [employer] entrance to private vehicle.

## 2019-09-11 NOTE — Plan of Care (Signed)
  Problem: Education: Goal: Knowledge of General Education information will improve Description: Including pain rating scale, medication(s)/side effects and non-pharmacologic comfort measures Outcome: Progressing   Problem: Health Behavior/Discharge Planning: Goal: Ability to manage health-related needs will improve Outcome: Progressing   Problem: Clinical Measurements: Goal: Will remain free from infection Outcome: Progressing Goal: Diagnostic test results will improve Outcome: Progressing   Problem: Nutrition: Goal: Adequate nutrition will be maintained Outcome: Progressing   Problem: Pain Managment: Goal: General experience of comfort will improve Outcome: Progressing

## 2019-09-13 ENCOUNTER — Other Ambulatory Visit: Payer: Self-pay | Admitting: Cardiovascular Disease

## 2019-09-14 ENCOUNTER — Other Ambulatory Visit: Payer: Self-pay

## 2019-09-14 MED ORDER — ROSUVASTATIN CALCIUM 5 MG PO TABS: 5.0000 mg | ORAL_TABLET | Freq: Every day | ORAL | 0 refills | Status: DC

## 2019-09-14 MED ORDER — NITROGLYCERIN 0.4 MG SL SUBL: 0.4000 mg | SUBLINGUAL_TABLET | SUBLINGUAL | 0 refills | Status: DC | PRN

## 2019-09-15 DIAGNOSIS — I5032 Chronic diastolic (congestive) heart failure: Secondary | ICD-10-CM | POA: Diagnosis not present

## 2019-09-15 DIAGNOSIS — F419 Anxiety disorder, unspecified: Secondary | ICD-10-CM | POA: Diagnosis not present

## 2019-09-15 DIAGNOSIS — I700 Atherosclerosis of aorta: Secondary | ICD-10-CM | POA: Diagnosis not present

## 2019-09-15 DIAGNOSIS — E039 Hypothyroidism, unspecified: Secondary | ICD-10-CM | POA: Diagnosis not present

## 2019-09-15 DIAGNOSIS — R911 Solitary pulmonary nodule: Secondary | ICD-10-CM | POA: Diagnosis not present

## 2019-09-15 DIAGNOSIS — I5020 Unspecified systolic (congestive) heart failure: Secondary | ICD-10-CM | POA: Diagnosis not present

## 2019-09-15 DIAGNOSIS — Z6828 Body mass index (BMI) 28.0-28.9, adult: Secondary | ICD-10-CM | POA: Diagnosis not present

## 2019-09-15 DIAGNOSIS — F329 Major depressive disorder, single episode, unspecified: Secondary | ICD-10-CM | POA: Diagnosis not present

## 2019-09-15 DIAGNOSIS — Z7982 Long term (current) use of aspirin: Secondary | ICD-10-CM | POA: Diagnosis not present

## 2019-09-15 DIAGNOSIS — I25118 Atherosclerotic heart disease of native coronary artery with other forms of angina pectoris: Secondary | ICD-10-CM | POA: Diagnosis not present

## 2019-09-15 DIAGNOSIS — I051 Rheumatic mitral insufficiency: Secondary | ICD-10-CM | POA: Diagnosis not present

## 2019-09-15 DIAGNOSIS — E785 Hyperlipidemia, unspecified: Secondary | ICD-10-CM | POA: Diagnosis not present

## 2019-09-15 DIAGNOSIS — I0981 Rheumatic heart failure: Secondary | ICD-10-CM | POA: Diagnosis not present

## 2019-09-15 DIAGNOSIS — M47812 Spondylosis without myelopathy or radiculopathy, cervical region: Secondary | ICD-10-CM | POA: Diagnosis not present

## 2019-09-15 DIAGNOSIS — Z9181 History of falling: Secondary | ICD-10-CM | POA: Diagnosis not present

## 2019-09-15 DIAGNOSIS — Z48812 Encounter for surgical aftercare following surgery on the circulatory system: Secondary | ICD-10-CM | POA: Diagnosis not present

## 2019-09-15 DIAGNOSIS — E669 Obesity, unspecified: Secondary | ICD-10-CM | POA: Diagnosis not present

## 2019-09-15 DIAGNOSIS — I214 Non-ST elevation (NSTEMI) myocardial infarction: Secondary | ICD-10-CM | POA: Diagnosis not present

## 2019-09-15 DIAGNOSIS — M1990 Unspecified osteoarthritis, unspecified site: Secondary | ICD-10-CM | POA: Diagnosis not present

## 2019-09-15 DIAGNOSIS — Z8542 Personal history of malignant neoplasm of other parts of uterus: Secondary | ICD-10-CM | POA: Diagnosis not present

## 2019-09-15 DIAGNOSIS — H9190 Unspecified hearing loss, unspecified ear: Secondary | ICD-10-CM | POA: Diagnosis not present

## 2019-09-15 DIAGNOSIS — Z951 Presence of aortocoronary bypass graft: Secondary | ICD-10-CM | POA: Diagnosis not present

## 2019-09-16 DIAGNOSIS — I051 Rheumatic mitral insufficiency: Secondary | ICD-10-CM | POA: Diagnosis not present

## 2019-09-16 DIAGNOSIS — I214 Non-ST elevation (NSTEMI) myocardial infarction: Secondary | ICD-10-CM | POA: Diagnosis not present

## 2019-09-16 DIAGNOSIS — I0981 Rheumatic heart failure: Secondary | ICD-10-CM | POA: Diagnosis not present

## 2019-09-16 DIAGNOSIS — H9190 Unspecified hearing loss, unspecified ear: Secondary | ICD-10-CM | POA: Diagnosis not present

## 2019-09-16 DIAGNOSIS — I5020 Unspecified systolic (congestive) heart failure: Secondary | ICD-10-CM | POA: Diagnosis not present

## 2019-09-16 DIAGNOSIS — Z951 Presence of aortocoronary bypass graft: Secondary | ICD-10-CM | POA: Diagnosis not present

## 2019-09-16 DIAGNOSIS — I5032 Chronic diastolic (congestive) heart failure: Secondary | ICD-10-CM | POA: Diagnosis not present

## 2019-09-16 DIAGNOSIS — F329 Major depressive disorder, single episode, unspecified: Secondary | ICD-10-CM | POA: Diagnosis not present

## 2019-09-16 DIAGNOSIS — M1990 Unspecified osteoarthritis, unspecified site: Secondary | ICD-10-CM | POA: Diagnosis not present

## 2019-09-16 DIAGNOSIS — R911 Solitary pulmonary nodule: Secondary | ICD-10-CM | POA: Diagnosis not present

## 2019-09-16 DIAGNOSIS — E669 Obesity, unspecified: Secondary | ICD-10-CM | POA: Diagnosis not present

## 2019-09-16 DIAGNOSIS — Z7982 Long term (current) use of aspirin: Secondary | ICD-10-CM | POA: Diagnosis not present

## 2019-09-16 DIAGNOSIS — I700 Atherosclerosis of aorta: Secondary | ICD-10-CM | POA: Diagnosis not present

## 2019-09-16 DIAGNOSIS — I25118 Atherosclerotic heart disease of native coronary artery with other forms of angina pectoris: Secondary | ICD-10-CM | POA: Diagnosis not present

## 2019-09-16 DIAGNOSIS — F419 Anxiety disorder, unspecified: Secondary | ICD-10-CM | POA: Diagnosis not present

## 2019-09-16 DIAGNOSIS — M47812 Spondylosis without myelopathy or radiculopathy, cervical region: Secondary | ICD-10-CM | POA: Diagnosis not present

## 2019-09-16 DIAGNOSIS — Z48812 Encounter for surgical aftercare following surgery on the circulatory system: Secondary | ICD-10-CM | POA: Diagnosis not present

## 2019-09-16 DIAGNOSIS — E039 Hypothyroidism, unspecified: Secondary | ICD-10-CM | POA: Diagnosis not present

## 2019-09-16 DIAGNOSIS — Z6828 Body mass index (BMI) 28.0-28.9, adult: Secondary | ICD-10-CM | POA: Diagnosis not present

## 2019-09-16 DIAGNOSIS — Z9181 History of falling: Secondary | ICD-10-CM | POA: Diagnosis not present

## 2019-09-16 DIAGNOSIS — Z8542 Personal history of malignant neoplasm of other parts of uterus: Secondary | ICD-10-CM | POA: Diagnosis not present

## 2019-09-16 DIAGNOSIS — E785 Hyperlipidemia, unspecified: Secondary | ICD-10-CM | POA: Diagnosis not present

## 2019-09-17 ENCOUNTER — Telehealth: Payer: Self-pay

## 2019-09-17 NOTE — Telephone Encounter (Signed)
Skeet Simmer, PT with [facility] health contacted the office [phone] requesting verbal orders for patient to have in home PT for 1 time a week x 5 weeks for strengthening exercises, sternal precautions, and strengthening.  She is s/p CABG with Dr. Prescott Gum 09/04/19, and was discharged from the hospital 09/11/19.  Verbal orders given.  Will await faxed copy for physician signature.

## 2019-09-18 ENCOUNTER — Other Ambulatory Visit: Payer: Self-pay

## 2019-09-18 ENCOUNTER — Encounter (INDEPENDENT_AMBULATORY_CARE_PROVIDER_SITE_OTHER): Payer: Self-pay

## 2019-09-18 DIAGNOSIS — Z4802 Encounter for removal of sutures: Secondary | ICD-10-CM

## 2019-09-21 NOTE — Progress Notes (Signed)
Cardiology Office Note    Date:  09/23/2019   ID:  Bonnie Hancock, DOB September 10, 1940, MRN [id number]  PCP:  Albina Billet, MD  Cardiologist:  Ida Rogue, MD  Electrophysiologist:  None   Chief Complaint: Hospital follow-up  History of Present Illness:   Bonnie Hancock is a 79 y.o. female with history of CAD status post three-vessel CABG on 09/04/2019, HFpEF, hyperlipidemia, hypothyroidism, obesity, arthritis, depression, and anxiety presents for hospital follow-up after recent admissions to [facility] from 7/7 to 7/8 for NSTEMI with transfer to [facility] from 7/8 through 7/23 for CABG in the setting of NSTEMI  Remote echo from 2012 showed normal LV systolic function, mild pulmonary pretension, and no acute valvular abnormality.  More recently, she was seen in the ED in 06/2019 with chest pain.  High-sensitivity troponin negative x2, D-dimer elevated with CTA of the chest negative for PE with incidentally noted pulmonary nodule.  In follow-up, ischemic evaluation was declined.  Following this, she was admitted to [facility] on 08/26/2019 with chest pain that began while riding in a car.  Initial high-sensitivity troponin VII 100 with a delta of 1155 and eventually peaking at nearly 9000.  EKG showed sinus bradycardia with nonspecific ST-T changes.  2D echo on 08/26/2019 showed an EF of 60 to 65%, no regional wall motion abnormalities, normal RV systolic function and RV cavity size, and trivial mitral regurgitation.  She underwent LHC on 08/27/2019 which showed significant three-vessel CAD including the distal left main, ostial LAD, mid LCx, mid RCA, and ostial to proximal OM with a mildly depressed LVEF of 45 to 50% with inferior wall hypokinesis.  She was subsequently transferred to [facility] for consideration of CABG versus multivessel PCI.  After review from interventional cardiology was felt her coronary artery disease was not ideal for PCI as outlined in the discharge summary from her hospitalization.  She was  evaluated by cardiothoracic surgery and subsequently underwent three-vessel CABG on 09/04/2019 with LIMA to LAD, SVG to OM, and SVG to PDA.  Pre-operative carotid artery ultrasound showed 1-39% bilateral carotid artery stenosis and antegrade flow of the bilateral vertebral arteries.  No significant changes in her poetoperative TEE.  Her postoperative course was complicated by diarrhea.  Metoprolol was held with bradycardia.  Discharge weight 81.8 kg.   She comes in doing well from a cardiac perspective.  She denies any chest pain, dyspnea, palpitations, dizziness, presyncope, or syncope.  She does note some mild right ankle swelling and is also concerned about some mild erythema at the superior aspect of her SVG harvest site without purulent drainage.  She has remained afebrile.  She denies any abdominal distention, orthopnea, PND, early satiety.  She is working with home health PT and is advancing her activities as tolerated.  She is not driving.  She has not had any falls, hematochezia, or melena.  She is tolerating all medications without issues.  Her weight is down 5 pounds today when compared to her last clinic weight, and stable when compared to her discharge weight.  She has follow-up with CVTS later this month.   Labs independently reviewed: 08/2019 - potassium 3.6, BUN 17, serum creatinine 0.81, Hgb 10.1, PLT 278, magnesium 2.2, A1c 5.9, TSH normal, TC 178, TG 206, HDL 35, LDL 102  Past Medical History:  Diagnosis Date  . Anxiety   . Arthritis   . Cancer (HCC)    UTERINE  . Coronary artery disease   . Depression   . Dyspnea   .  Heart murmur   . HOH (hard of hearing)   . Hyperlipidemia   . Hypothyroidism   . Non-STEMI (non-ST elevated myocardial infarction) [facility])     Past Surgical History:  Procedure Laterality Date  . APPENDECTOMY    . CATARACT EXTRACTION W/PHACO Left 09/17/2017   Procedure: CATARACT EXTRACTION PHACO AND INTRAOCULAR LENS PLACEMENT (IOC);  Surgeon: Birder Robson,  MD;  Location: [facility] ORS;  Service: Ophthalmology;  Laterality: Left;  [place] 00:48 AP% 13.0 CDE 6.26 Fluid pack lot # [id number]  . CATARACT EXTRACTION W/PHACO Right 10/15/2017   Procedure: CATARACT EXTRACTION PHACO AND INTRAOCULAR LENS PLACEMENT (IOC);  Surgeon: Birder Robson, MD;  Location: [facility] ORS;  Service: Ophthalmology;  Laterality: Right;  [place]  00:44 AP% 16.1 CDE 7.23 Fluid pack lot # [id number]  . CHOLECYSTECTOMY    . CORONARY ARTERY BYPASS GRAFT N/A 09/04/2019   Procedure: CORONARY ARTERY BYPASS GRAFTING (CABG), ON PUMP, TIMES THREE, LIMA TO LAD, SVG TO PDA, SVG TO OM 2;  Surgeon: Ivin Poot, MD;  Location: [place];  Service: Open Heart Surgery;  Laterality: N/A;  . ENDOVEIN HARVEST OF GREATER SAPHENOUS VEIN Right 09/04/2019   Procedure: ENDOVEIN HARVEST OF GREATER SAPHENOUS VEIN;  Surgeon: Ivin Poot, MD;  Location: [place];  Service: Open Heart Surgery;  Laterality: Right;  . LEFT HEART CATH AND CORONARY ANGIOGRAPHY N/A 08/27/2019   Procedure: LEFT HEART CATH AND CORONARY ANGIOGRAPHY;  Surgeon: Minna Merritts, MD;  Location: [place] CV LAB;  Service: Cardiovascular;  Laterality: N/A;  . TEE WITHOUT CARDIOVERSION N/A 09/04/2019   Procedure: TRANSESOPHAGEAL ECHOCARDIOGRAM (TEE);  Surgeon: Prescott Gum, Collier Salina, MD;  Location: [place];  Service: Open Heart Surgery;  Laterality: N/A;    Current Medications: Current Meds  Medication Sig  . acetaminophen (TYLENOL) 500 MG tablet Take 500 mg by mouth every 6 (six) hours as needed for moderate pain or headache.  Marland Kitchen aspirin EC 325 MG EC tablet Take 1 tablet (325 mg total) by mouth daily.  . clopidogrel (PLAVIX) 75 MG tablet Take 1 tablet (75 mg total) by mouth daily.  Marland Kitchen escitalopram (LEXAPRO) 10 MG tablet Take 10 mg by mouth daily.   Marland Kitchen ezetimibe (ZETIA) 10 MG tablet Take 1 tablet by mouth once daily  . levothyroxine (EUTHYROX) 50 MCG tablet Take 50 mcg by mouth daily before breakfast.  . nitroGLYCERIN (NITROSTAT) 0.4 MG SL tablet Place 1  tablet (0.4 mg total) under the tongue every 5 (five) minutes x 3 doses as needed for chest pain.  . rosuvastatin (CRESTOR) 5 MG tablet Take 1 tablet (5 mg total) by mouth daily for 60 doses.    Allergies:   Statins   Social History   Socioeconomic History  . Marital status: Married    Spouse name: Not on file  . Number of children: 4  . Years of education: Not on file  . Highest education level: Not on file  Occupational History  . Occupation: retired  Tobacco Use  . Smoking status: Never Smoker  . Smokeless tobacco: Never Used  Vaping Use  . Vaping Use: Never used  Substance and Sexual Activity  . Alcohol use: Not Currently  . Drug use: No  . Sexual activity: Not on file  Other Topics Concern  . Not on file  Social History Narrative  . Not on file   Social Determinants of Health   Financial Resource Strain:   . Difficulty of Paying Living Expenses:   Food Insecurity:   . Worried About  Running Out of Food in the Last Year:   . [place] in the Last Year:   Transportation Needs:   . Lack of Transportation (Medical):   Marland Kitchen Lack of Transportation (Non-Medical):   Physical Activity:   . Days of Exercise per Week:   . Minutes of Exercise per Session:   Stress:   . Feeling of Stress :   Social Connections:   . Frequency of Communication with Friends and Family:   . Frequency of Social Gatherings with Friends and Family:   . Attends Religious Services:   . Active Member of Clubs or Organizations:   . Attends Archivist Meetings:   Marland Kitchen Marital Status:      Family History:  The patient's family history includes Emphysema in her mother; Heart attack in her father; Heart disease in her mother.  ROS:   Review of Systems  Constitutional: Positive for malaise/fatigue. Negative for chills, diaphoresis, fever and weight loss.  HENT: Negative for congestion.   Eyes: Negative for discharge and redness.  Respiratory: Negative for cough, hemoptysis, sputum  production, shortness of breath and wheezing.   Cardiovascular: Positive for leg swelling. Negative for chest pain, palpitations, orthopnea, claudication and PND.       Right ankle edema  Gastrointestinal: Negative for abdominal pain, blood in stool, heartburn, melena, nausea and vomiting.  Genitourinary: Negative for hematuria.  Musculoskeletal: Negative for falls and myalgias.  Skin: Negative for rash.  Neurological: Negative for dizziness, tingling, tremors, sensory change, speech change, focal weakness, loss of consciousness and weakness.  Endo/Heme/Allergies: Does not bruise/bleed easily.  Psychiatric/Behavioral: Negative for substance abuse. The patient is not nervous/anxious.   All other systems reviewed and are negative.    EKGs/Labs/Other Studies Reviewed:    Studies reviewed were summarized above. The additional studies were reviewed today:  Intraoperative TEE 08/2019: POST-OP IMPRESSIONS  - Left Ventricle: The left ventricle is unchanged from pre-bypass.  - Aorta: The aorta appears unchanged from pre-bypass.  - Left Atrial Appendage: The left atrial appendage appears unchanged from  pre-bypass.  - Aortic Valve: The aortic valve appears unchanged from pre-bypass.  - Mitral Valve: Slight improvement of MR post.  - Tricuspid Valve: The tricuspid valve appears unchanged from pre-bypass. __________  Pre-CABG carotid, upper and extremity ABIs 08/2019: Right Carotid: Velocities in the right ICA are consistent with a 1-39%  stenosis.   Left Carotid: Velocities in the left ICA are consistent with a 1-39%  stenosis.  Vertebrals: Bilateral vertebral arteries demonstrate antegrade flow.   Right Upper Extremity: Doppler waveforms remain within normal limits with  right radial compression. Doppler waveform obliterate with right ulnar  compression.  Left Upper Extremity: Doppler waveform obliterate with left radial  compression. Doppler waveforms remain within normal limits with  left ulnar  compression.  __________  LHC 08/2019: Coronary dominance: Right  Left mainstem:   Large vessel that bifurcates into the LAD and left circumflex, moderate to severe calcified distal left main disease estimated 70%  Left anterior descending (LAD):   Large vessel that extends to the apical region, diagonal branch 2 of moderate size, ostial/proximal LAD 80%, heavily calcified, 50% mid LAD disease after diagonal branch  Left circumflex (LCx):  Large vessel with OM branch 2, mid vessel with 60% diffuse disease, OM 2 with 60 to 70% ostial disease  Right coronary artery (RCA):  Right dominant vessel with PL and PDA, likely culprit vessel for non-STEMI, 99% mid vessel stenosis, long region  Left ventriculography: Left ventricular  systolic function is mildly depressed 45 to 50%, inferior wall hypokinesis  there is no significant mitral regurgitation , no significant aortic valve stenosis  Final Conclusions:   Three-vessel coronary disease There is distal left main, ostial LAD, mid left circumflex, mid RCA disease, ostial/proximal OM disease Mildly depressed ejection fraction 45 to 50% with inferior wall hypokinesis  Recommendations:  Case discussed with interventional cardiology, Patient unable to make a decision whether she would prefer CABG versus stenting Decision made to transfer to [facility] for further consideration of high risk stenting, would likely need multiprocedure versus CABG which may give her the best long-lasting result given multivessel disease especially in light of previous medication intolerances -The above was discussed with family, they are in agreement __________  2D echo 08/2019: 1. Left ventricular ejection fraction, by estimation, is 60 to 65%. The  left ventricle has normal function. The left ventricle has no regional  wall motion abnormalities. Left ventricular diastolic function could not  be evaluated.  2. Right ventricular systolic function is normal.  The right ventricular  size is normal.  3. The mitral valve is normal in structure. Trivial mitral valve  regurgitation. No evidence of mitral stenosis.  4. The aortic valve is normal in structure. Aortic valve regurgitation is  not visualized. No aortic stenosis is present.  5. The inferior vena cava is normal in size with greater than 50%  respiratory variability, suggesting right atrial pressure of 3 mmHg.   EKG:  EKG is ordered today.  The EKG ordered today demonstrates NSR, 77 bpm, possible prior inferior infarct, anterior T wave inversion  Recent Labs: 06/22/2019: B Natriuretic Peptide 76.0 08/28/2019: TSH 3.269 09/05/2019: Magnesium 2.2 09/11/2019: BUN 17; Creatinine, Ser 0.81; Hemoglobin 10.1; Platelets 278; Potassium 3.6; Sodium 138  Recent Lipid Panel    Component Value Date/Time   CHOL 178 08/27/2019 0051   CHOL 261 (H) 04/27/2019 1229   TRIG 206 (H) 08/27/2019 0051   HDL 35 (L) 08/27/2019 0051   HDL 43 04/27/2019 1229   CHOLHDL 5.1 08/27/2019 0051   VLDL 41 (H) 08/27/2019 0051   LDLCALC 102 (H) 08/27/2019 0051   LDLCALC 184 (H) 04/27/2019 1229    PHYSICAL EXAM:    VS:  BP 122/60 (BP Location: Right Arm, Patient Position: Sitting, Cuff Size: Normal)   Pulse 77   Ht 5\' 2"  (1.575 m)   Wt 180 lb (81.6 kg)   LMP  (LMP Unknown)   SpO2 95%   BMI 32.92 kg/m   BMI: Body mass index is 32.92 kg/m.  Physical Exam Constitutional:      Appearance: She is well-developed.  HENT:     Head: Normocephalic and atraumatic.  Eyes:     General:        Right eye: No discharge.        Left eye: No discharge.  Neck:     Vascular: No JVD.  Cardiovascular:     Rate and Rhythm: Normal rate and regular rhythm.     Pulses: No midsystolic click and no opening snap.          Posterior tibial pulses are 2+ on the right side and 2+ on the left side.     Heart sounds: Normal heart sounds, S1 normal and S2 normal. Heart sounds not distant. No murmur heard.  No friction rub.      Comments: Sternotomy surgical site appears to be healing well without evidence of infection. Pulmonary:     Effort: Pulmonary effort is normal.  No respiratory distress.     Breath sounds: Normal breath sounds. No decreased breath sounds, wheezing or rales.  Chest:     Chest wall: No tenderness.  Abdominal:     General: There is no distension.     Palpations: Abdomen is soft.     Tenderness: There is no abdominal tenderness.  Musculoskeletal:     Cervical back: Normal range of motion.     Right lower leg: Edema present.     Comments: Trace right ankle edema.  Very mild erythema along the superior aspect of the right SVG harvest site without fluctuance or purulent drainage.  No ecchymosis or bleeding.  No TTP.  Skin:    General: Skin is warm and dry.     Nails: There is no clubbing.  Neurological:     Mental Status: She is alert and oriented to person, place, and time.  Psychiatric:        Speech: Speech normal.        Behavior: Behavior normal.        Thought Content: Thought content normal.        Judgment: Judgment normal.     Wt Readings from Last 3 Encounters:  09/23/19 180 lb (81.6 kg)  09/11/19 180 lb 5.4 oz (81.8 kg)  08/25/19 180 lb (81.6 kg)     ASSESSMENT & PLAN:   1. CAD s/p CABG: She is doing well without any symptom concerning for angina. Continue DAPT with ASA and Plavix 75 mg given NSTEMI.  I have reached out to her surgeon regarding aspirin dosing.  If this is okay with them, I plan to decrease her aspirin to 81 mg from 325 mg given that she is on Plavix 75 mg in an effort to minimize bleeding risk.  Otherwise, she will titrate Crestor as outlined below and continue current dose of Zetia.  Currently not on beta-blocker secondary to bradycardic heart rates in the hospital.  In follow-up, consider adding low-dose carvedilol.  She is currently working with home health PT and plans to follow-up with cardiac rehab.  Follow-up with CVTS as directed.  Check CBC and  BMP.  2. HFpEF: She appears euvolemic and well compensated.  Her weight is stable compared to her discharge weight and 5 pounds down when compared to her last clinic visit.  She is not requiring standing diuretic therapy.  Blood pressure and heart rate are well controlled.  No indication for escalation of medical therapy at this time.  CHF education.  3. HLD: LDL 102 from 08/2019, while on Crestor 5 mg and Zetia. Goal LDL < 70.  Increase Crestor to 10 mg daily.  She will otherwise continue Zetia 10 mg daily.  Follow-up fasting lipid panel and liver function in approximately 8 weeks.  If she does not tolerate escalated dose of Crestor and/or her LDL remains above goal recommend referral to lipid clinic for initiation of PCSK9 inhibitor.  4. Cellulitis: Patient with mild erythema along the superior aspect of her SVG harvest site.  Keflex 500 mg twice daily for 7 days.  Follow-up with PCP if erythema persists.  Disposition: F/u with Dr. Rockey Situ or an APP in 2 to 4 weeks, pending labs.   Medication Adjustments/Labs and Tests Ordered: Current medicines are reviewed at length with the patient today.  Concerns regarding medicines are outlined above. Medication changes, Labs and Tests ordered today are summarized above and listed in the Patient Instructions accessible in Encounters.   Signed, Christell Faith, PA-C 09/23/2019 1:39  PM     [address] [place], [place] [id number] [phone]

## 2019-09-23 ENCOUNTER — Encounter: Payer: Self-pay | Admitting: Physician Assistant

## 2019-09-23 ENCOUNTER — Other Ambulatory Visit: Payer: Self-pay

## 2019-09-23 ENCOUNTER — Ambulatory Visit (INDEPENDENT_AMBULATORY_CARE_PROVIDER_SITE_OTHER): Payer: PPO | Admitting: Physician Assistant

## 2019-09-23 VITALS — BP 122/60 | HR 77 | Ht 62.0 in | Wt 180.0 lb

## 2019-09-23 DIAGNOSIS — L03115 Cellulitis of right lower limb: Secondary | ICD-10-CM | POA: Diagnosis not present

## 2019-09-23 DIAGNOSIS — I2510 Atherosclerotic heart disease of native coronary artery without angina pectoris: Secondary | ICD-10-CM

## 2019-09-23 DIAGNOSIS — E785 Hyperlipidemia, unspecified: Secondary | ICD-10-CM

## 2019-09-23 DIAGNOSIS — Z951 Presence of aortocoronary bypass graft: Secondary | ICD-10-CM

## 2019-09-23 DIAGNOSIS — I5032 Chronic diastolic (congestive) heart failure: Secondary | ICD-10-CM | POA: Diagnosis not present

## 2019-09-23 MED ORDER — CEPHALEXIN 500 MG PO CAPS
500.0000 mg | ORAL_CAPSULE | Freq: Two times a day (BID) | ORAL | 0 refills | Status: DC
Start: 2019-09-23 — End: 2019-10-07

## 2019-09-23 MED ORDER — ROSUVASTATIN CALCIUM 10 MG PO TABS
10.0000 mg | ORAL_TABLET | Freq: Every day | ORAL | 3 refills | Status: DC
Start: 2019-09-23 — End: 2020-10-17

## 2019-09-23 MED ORDER — ROSUVASTATIN CALCIUM 10 MG PO TABS: 10.0000 mg | ORAL_TABLET | Freq: Every day | ORAL | 3 refills | Status: DC

## 2019-09-23 NOTE — Patient Instructions (Signed)
Medication Instructions:  1- Take Keflex Take 1 capsule (500 mg total) by mouth 2 (two) times daily for 7 days 2- INCREASE Crestor Take 1 tablet (10 mg total) by mouth daily *If you need a refill on your cardiac medications before your next appointment, please call your pharmacy*   Lab Work: Your physician recommends that you have lab work today(CBC, BMET)  If you have labs (blood work) drawn today and your tests are completely normal, you will receive your results only by: Marland Kitchen MyChart Message (if you have MyChart) OR . A paper copy in the mail If you have any lab test that is abnormal or we need to change your treatment, we will call you to review the results.   Testing/Procedures: None ordered   Follow-Up: At [facility], you and your health needs are our priority.  As part of our continuing mission to provide you with exceptional heart care, we have created designated Provider Care Teams.  These Care Teams include your primary Cardiologist (physician) and Advanced Practice Providers (APPs -  Physician Assistants and Nurse Practitioners) who all work together to provide you with the care you need, when you need it.  We recommend signing up for the patient portal called "MyChart".  Sign up information is provided on this After Visit Summary.  MyChart is used to connect with patients for Virtual Visits (Telemedicine).  Patients are able to view lab/test results, encounter notes, upcoming appointments, etc.  Non-urgent messages can be sent to your provider as well.   To learn more about what you can do with MyChart, go to [email or web address].    Your next appointment:   As scheduled

## 2019-09-24 ENCOUNTER — Telehealth: Payer: Self-pay

## 2019-09-24 LAB — BASIC METABOLIC PANEL
BUN/Creatinine Ratio: 20 (ref 12–28)
BUN: 15 mg/dL (ref 8–27)
CO2: 22 mmol/L (ref 20–29)
Calcium: 9.7 mg/dL (ref 8.7–10.3)
Chloride: 105 mmol/L (ref 96–106)
Creatinine, Ser: 0.76 mg/dL (ref 0.57–1.00)
GFR calc Af Amer: 86 mL/min/{1.73_m2} (ref >59)
GFR calc non Af Amer: 74 mL/min/{1.73_m2} (ref >59)
Glucose: 133 mg/dL — ABNORMAL HIGH (ref 65–99)
Potassium: 4.9 mmol/L (ref 3.5–5.2)
Sodium: 143 mmol/L (ref 134–144)

## 2019-09-24 LAB — CBC
Hematocrit: 32.2 % — ABNORMAL LOW (ref 34.0–46.6)
Hemoglobin: 10.2 g/dL — ABNORMAL LOW (ref 11.1–15.9)
MCH: 29.1 pg (ref 26.6–33.0)
MCHC: 31.7 g/dL (ref 31.5–35.7)
MCV: 92 fL (ref 79–97)
Platelets: 397 10*3/uL (ref 150–450)
RBC: 3.5 x10E6/uL — ABNORMAL LOW (ref 3.77–5.28)
RDW: 14.9 % (ref 11.7–15.4)
WBC: 5.1 10*3/uL (ref 3.4–10.8)

## 2019-09-24 NOTE — Telephone Encounter (Signed)
Call attempted. No answer, no vm. 

## 2019-09-24 NOTE — Telephone Encounter (Signed)
-----   Message from Rise Mu, PA-C sent at 09/24/2019  8:32 AM EDT ----- Random glucose is mildly elevated Renal function is normal. Potassium at goal.  Blood count is stable.   No changes.  Awaiting input from [facility]TS regarding decreasing ASA to 81 mg daily given she is also on Plavix. We will be back in touch once we hear back from them.

## 2019-09-25 DIAGNOSIS — E785 Hyperlipidemia, unspecified: Secondary | ICD-10-CM | POA: Diagnosis not present

## 2019-09-25 DIAGNOSIS — I214 Non-ST elevation (NSTEMI) myocardial infarction: Secondary | ICD-10-CM | POA: Diagnosis not present

## 2019-09-25 DIAGNOSIS — E669 Obesity, unspecified: Secondary | ICD-10-CM | POA: Diagnosis not present

## 2019-09-25 DIAGNOSIS — Z48812 Encounter for surgical aftercare following surgery on the circulatory system: Secondary | ICD-10-CM | POA: Diagnosis not present

## 2019-09-25 DIAGNOSIS — F419 Anxiety disorder, unspecified: Secondary | ICD-10-CM | POA: Diagnosis not present

## 2019-09-25 DIAGNOSIS — F329 Major depressive disorder, single episode, unspecified: Secondary | ICD-10-CM | POA: Diagnosis not present

## 2019-09-25 DIAGNOSIS — R911 Solitary pulmonary nodule: Secondary | ICD-10-CM | POA: Diagnosis not present

## 2019-09-25 DIAGNOSIS — I5020 Unspecified systolic (congestive) heart failure: Secondary | ICD-10-CM | POA: Diagnosis not present

## 2019-09-25 DIAGNOSIS — E039 Hypothyroidism, unspecified: Secondary | ICD-10-CM | POA: Diagnosis not present

## 2019-09-25 DIAGNOSIS — M47812 Spondylosis without myelopathy or radiculopathy, cervical region: Secondary | ICD-10-CM | POA: Diagnosis not present

## 2019-09-25 DIAGNOSIS — Z8542 Personal history of malignant neoplasm of other parts of uterus: Secondary | ICD-10-CM | POA: Diagnosis not present

## 2019-09-25 DIAGNOSIS — I700 Atherosclerosis of aorta: Secondary | ICD-10-CM | POA: Diagnosis not present

## 2019-09-25 DIAGNOSIS — I051 Rheumatic mitral insufficiency: Secondary | ICD-10-CM | POA: Diagnosis not present

## 2019-09-25 DIAGNOSIS — I25118 Atherosclerotic heart disease of native coronary artery with other forms of angina pectoris: Secondary | ICD-10-CM | POA: Diagnosis not present

## 2019-09-25 DIAGNOSIS — I0981 Rheumatic heart failure: Secondary | ICD-10-CM | POA: Diagnosis not present

## 2019-09-25 DIAGNOSIS — I5032 Chronic diastolic (congestive) heart failure: Secondary | ICD-10-CM | POA: Diagnosis not present

## 2019-09-25 DIAGNOSIS — Z9181 History of falling: Secondary | ICD-10-CM | POA: Diagnosis not present

## 2019-09-25 DIAGNOSIS — Z951 Presence of aortocoronary bypass graft: Secondary | ICD-10-CM | POA: Diagnosis not present

## 2019-09-25 DIAGNOSIS — M1990 Unspecified osteoarthritis, unspecified site: Secondary | ICD-10-CM | POA: Diagnosis not present

## 2019-09-25 DIAGNOSIS — H9190 Unspecified hearing loss, unspecified ear: Secondary | ICD-10-CM | POA: Diagnosis not present

## 2019-09-25 DIAGNOSIS — Z7982 Long term (current) use of aspirin: Secondary | ICD-10-CM | POA: Diagnosis not present

## 2019-09-25 DIAGNOSIS — Z6828 Body mass index (BMI) 28.0-28.9, adult: Secondary | ICD-10-CM | POA: Diagnosis not present

## 2019-09-25 NOTE — Telephone Encounter (Signed)
-----   Message from Rise Mu, PA-C sent at 09/25/2019  1:49 PM EDT ----- Marykay Lex, I got word back from Dr. Prescott Gum that we can decrease her ASA to 81 mg daily. She should continue Plavix 75 mg daily. Can you up date her medication list and let her know? Thanks!

## 2019-09-25 NOTE — Telephone Encounter (Signed)
Attempted to call patient. LMTCB 09/25/2019   

## 2019-09-29 NOTE — Telephone Encounter (Signed)
Call attempted. No answer, no vm.   3rd attempt. Letter mailed to pt. Encounter completed at this time.

## 2019-10-02 MED ORDER — ASPIRIN EC 81 MG PO TBEC
81.0000 mg | DELAYED_RELEASE_TABLET | Freq: Every day | ORAL | 3 refills | Status: DC
Start: 2019-10-02 — End: 2023-07-02

## 2019-10-02 NOTE — Telephone Encounter (Signed)
Spoke with patient and her husband. Reviewed lab results and recommendations. Instructed her to decrease her aspirin down to 81 mg once daily and remain on her Plavix (Clopidogrel) 75 mg once daily. She verbalized instructions and her spouse as well with no further questions at this time.

## 2019-10-02 NOTE — Addendum Note (Signed)
Addended by: Valora Corporal on: 10/02/2019 04:23 PM   Modules accepted: Orders

## 2019-10-05 NOTE — Progress Notes (Signed)
Cardiology Office Note    Date:  10/09/2019   ID:  Bonnie Hancock, DOB 10/27/1940, MRN [id number]  PCP:  Bonnie Billet, MD  Cardiologist:  Bonnie Rogue, MD  Electrophysiologist:  None   Chief Complaint: Follow up  History of Present Illness:   Bonnie Hancock is a 79 y.o. female with history of CAD status post three-vessel CABG on 09/04/2019, HFpEF, hyperlipidemia, hypothyroidism, obesity, arthritis, depression, and anxiety presents for follow up of her CAD.  Remote echo from 2012 showed normal LV systolic function, mild pulmonary pretension, and no acute valvular abnormality.  More recently, she was seen in the ED in 06/2019 with chest pain.  High-sensitivity troponin negative x2, D-dimer elevated with CTA of the chest negative for PE with incidentally noted pulmonary nodule.  In follow-up, ischemic evaluation was declined.  Following this, she was admitted to [facility] on 08/26/2019 with chest pain that began while riding in a car.  Initial high-sensitivity troponin VII 100 with a delta of 1155 and eventually peaking at nearly 9000.  EKG showed sinus bradycardia with nonspecific ST-T changes.  2D echo on 08/26/2019 showed an EF of 60 to 65%, no regional wall motion abnormalities, normal RV systolic function and RV cavity size, and trivial mitral regurgitation.  She underwent LHC on 08/27/2019 which showed significant three-vessel CAD including the distal left main, ostial LAD, mid LCx, mid RCA, and ostial to proximal OM with a mildly depressed LVEF of 45 to 50% with inferior wall hypokinesis.  She was subsequently transferred to [facility] for consideration of CABG versus multivessel PCI.  After review from interventional cardiology was felt her coronary artery disease was not ideal for PCI as outlined in the discharge summary from her hospitalization.  She was evaluated by cardiothoracic surgery and subsequently underwent three-vessel CABG on 09/04/2019 with LIMA to LAD, SVG to OM, and SVG to PDA.   Pre-operative carotid artery ultrasound showed 1-39% bilateral carotid artery stenosis and antegrade flow of the bilateral vertebral arteries.  No significant changes in her poetoperative TEE.  Her postoperative course was complicated by diarrhea.  Metoprolol was held with bradycardia.  Discharge weight 81.8 kg.   She was seen in follow up on 09/23/2019, and was doing well from a cardiac perspective.  After discussion with her surgeon, ASA was reduced to 81 mg daily given she was also on Plavix.  There was some mild erythema along the superior aspect of her SVG harvest site without purulent drainage and she was placed on a short course of Keflex.  Her weight was down 5 pounds when compared to her prior clinic weight.   She comes in accompanied by her sister today and is doing well from a cardiac perspective.  No chest pain, dyspnea, palpitations, dizziness, presyncope, or syncope.  Since starting Keflex she has noted a rash along her bilateral lower extremities.  She is uncertain if she is still taking this medication though should have completed her antibiotic course last week.  She is also uncertain if she increased her Crestor to 10 mg daily.  She does feel like she did decrease her aspirin to 81 mg daily given she is also on clopidogrel.  Her activity level continues to improve.  She is not interested in participating in cardiac rehab.  She has followed up with cardiothoracic surgery and was doing well from their perspective.   Labs independently reviewed: 09/2019 - HGB 10.2, PLT 397, BUN 15, SCr 0.76, potassium 4.9 08/2019 - magnesium 2.2, A1c  5.9, TSH normal, TC 178, TG 206, HDL 35, LDL 102    Past Medical History:  Diagnosis Date  . Anxiety   . Arthritis   . Cancer (HCC)    UTERINE  . Coronary artery disease   . Depression   . Dyspnea   . Heart murmur   . HOH (hard of hearing)   . Hyperlipidemia   . Hypothyroidism   . Non-STEMI (non-ST elevated myocardial infarction) [facility])     Past  Surgical History:  Procedure Laterality Date  . APPENDECTOMY    . CATARACT EXTRACTION W/PHACO Left 09/17/2017   Procedure: CATARACT EXTRACTION PHACO AND INTRAOCULAR LENS PLACEMENT (IOC);  Surgeon: Birder Robson, MD;  Location: [facility] ORS;  Service: Ophthalmology;  Laterality: Left;  [place] 00:48 AP% 13.0 CDE 6.26 Fluid pack lot # [id number]  . CATARACT EXTRACTION W/PHACO Right 10/15/2017   Procedure: CATARACT EXTRACTION PHACO AND INTRAOCULAR LENS PLACEMENT (IOC);  Surgeon: Birder Robson, MD;  Location: [facility] ORS;  Service: Ophthalmology;  Laterality: Right;  [place]  00:44 AP% 16.1 CDE 7.23 Fluid pack lot # [id number]  . CHOLECYSTECTOMY    . CORONARY ARTERY BYPASS GRAFT N/A 09/04/2019   Procedure: CORONARY ARTERY BYPASS GRAFTING (CABG), ON PUMP, TIMES THREE, LIMA TO LAD, SVG TO PDA, SVG TO OM 2;  Surgeon: Ivin Poot, MD;  Location: [place];  Service: Open Heart Surgery;  Laterality: N/A;  . ENDOVEIN HARVEST OF GREATER SAPHENOUS VEIN Right 09/04/2019   Procedure: ENDOVEIN HARVEST OF GREATER SAPHENOUS VEIN;  Surgeon: Ivin Poot, MD;  Location: [place];  Service: Open Heart Surgery;  Laterality: Right;  . LEFT HEART CATH AND CORONARY ANGIOGRAPHY N/A 08/27/2019   Procedure: LEFT HEART CATH AND CORONARY ANGIOGRAPHY;  Surgeon: Minna Merritts, MD;  Location: [place] CV LAB;  Service: Cardiovascular;  Laterality: N/A;  . TEE WITHOUT CARDIOVERSION N/A 09/04/2019   Procedure: TRANSESOPHAGEAL ECHOCARDIOGRAM (TEE);  Surgeon: Prescott Gum, Collier Salina, MD;  Location: [place];  Service: Open Heart Surgery;  Laterality: N/A;    Current Medications: Current Meds  Medication Sig  . acetaminophen (TYLENOL) 500 MG tablet Take 500 mg by mouth every 6 (six) hours as needed for moderate pain or headache.  Marland Kitchen aspirin EC 81 MG tablet Take 1 tablet (81 mg total) by mouth daily. Swallow whole.  . carvedilol (COREG) 3.125 MG tablet Take 1 tablet (3.125 mg total) by mouth 2 (two) times daily with a meal.  . clopidogrel  (PLAVIX) 75 MG tablet Take 1 tablet (75 mg total) by mouth daily.  Marland Kitchen escitalopram (LEXAPRO) 10 MG tablet Take 10 mg by mouth daily.   Marland Kitchen ezetimibe (ZETIA) 10 MG tablet Take 1 tablet by mouth once daily  . levothyroxine (EUTHYROX) 50 MCG tablet Take 50 mcg by mouth daily before breakfast.  . lidocaine (LIDODERM) 5 % Place 1 patch onto the skin daily. Remove & Discard patch within 12 hours or as directed by MD Apply to burning painful area in right anterior chest  . nitroGLYCERIN (NITROSTAT) 0.4 MG SL tablet Place 1 tablet (0.4 mg total) under the tongue every 5 (five) minutes x 3 doses as needed for chest pain.  . rosuvastatin (CRESTOR) 10 MG tablet Take 1 tablet (10 mg total) by mouth daily.    Allergies:   Statins   Social History   Socioeconomic History  . Marital status: Married    Spouse name: Not on file  . Number of children: 4  . Years of education: Not on file  .  Highest education level: Not on file  Occupational History  . Occupation: retired  Tobacco Use  . Smoking status: Never Smoker  . Smokeless tobacco: Never Used  Vaping Use  . Vaping Use: Never used  Substance and Sexual Activity  . Alcohol use: Not Currently  . Drug use: No  . Sexual activity: Not on file  Other Topics Concern  . Not on file  Social History Narrative  . Not on file   Social Determinants of Health   Financial Resource Strain:   . Difficulty of Paying Living Expenses: Not on file  Food Insecurity:   . Worried About Charity fundraiser in the Last Year: Not on file  . Ran Out of Food in the Last Year: Not on file  Transportation Needs:   . Lack of Transportation (Medical): Not on file  . Lack of Transportation (Non-Medical): Not on file  Physical Activity:   . Days of Exercise per Week: Not on file  . Minutes of Exercise per Session: Not on file  Stress:   . Feeling of Stress : Not on file  Social Connections:   . Frequency of Communication with Friends and Family: Not on file  .  Frequency of Social Gatherings with Friends and Family: Not on file  . Attends Religious Services: Not on file  . Active Member of Clubs or Organizations: Not on file  . Attends Archivist Meetings: Not on file  . Marital Status: Not on file     Family History:  The patient's family history includes Emphysema in her mother; Heart attack in her father; Heart disease in her mother.  ROS:   Review of Systems  Constitutional: Positive for malaise/fatigue. Negative for chills, diaphoresis, fever and weight loss.       Improving fatigue  HENT: Negative for congestion.   Eyes: Negative for discharge and redness.  Respiratory: Negative for cough, sputum production, shortness of breath and wheezing.   Cardiovascular: Negative for chest pain, palpitations, orthopnea, claudication, leg swelling and PND.  Gastrointestinal: Negative for abdominal pain, heartburn, nausea and vomiting.  Musculoskeletal: Negative for falls and myalgias.  Skin: Positive for rash.  Neurological: Negative for dizziness, tingling, tremors, sensory change, speech change, focal weakness, loss of consciousness and weakness.  Endo/Heme/Allergies: Does not bruise/bleed easily.  Psychiatric/Behavioral: Negative for substance abuse. The patient is not nervous/anxious.   All other systems reviewed and are negative.    EKGs/Labs/Other Studies Reviewed:    Studies reviewed were summarized above. The additional studies were reviewed today:  Intraoperative TEE 08/2019: POST-OP IMPRESSIONS  - Left Ventricle: The left ventricle is unchanged from pre-bypass.  - Aorta: The aorta appears unchanged from pre-bypass.  - Left Atrial Appendage: The left atrial appendage appears unchanged from  pre-bypass.  - Aortic Valve: The aortic valve appears unchanged from pre-bypass.  - Mitral Valve: Slight improvement of MR post.  - Tricuspid Valve: The tricuspid valve appears unchanged from pre-bypass. __________  Pre-CABG  carotid, upper and extremity ABIs 08/2019: Right Carotid: Velocities in the right ICA are consistent with a 1-39%  stenosis.   Left Carotid: Velocities in the left ICA are consistent with a 1-39%  stenosis.  Vertebrals: Bilateral vertebral arteries demonstrate antegrade flow.   Right Upper Extremity: Doppler waveforms remain within normal limits with  right radial compression. Doppler waveform obliterate with right ulnar  compression.  Left Upper Extremity: Doppler waveform obliterate with left radial  compression. Doppler waveforms remain within normal limits with left ulnar  compression.  __________  LHC 08/2019: Coronary dominance: Right  Left mainstem: Large vessel that bifurcates into the LAD and left circumflex, moderate to severe calcified distal left main disease estimated 70%  Left anterior descending (LAD): Large vessel that extends to the apical region, diagonal branch 2 of moderate size, ostial/proximal LAD 80%, heavily calcified, 50% mid LAD disease after diagonal branch  Left circumflex (LCx): Large vessel with OM branch 2, mid vessel with 60% diffuse disease, OM 2 with 60 to 70% ostial disease  Right coronary artery (RCA): Right dominant vessel with PL and PDA, likely culprit vessel for non-STEMI, 99% mid vessel stenosis, long region  Left ventriculography: Left ventricular systolic function is mildly depressed 45 to 50%, inferior wall hypokinesis there is no significant mitral regurgitation , no significant aortic valve stenosis  Final Conclusions:  Three-vessel coronary disease There is distal left main, ostial LAD, mid left circumflex, mid RCA disease, ostial/proximal OM disease Mildly depressed ejection fraction 45 to 50% with inferior wall hypokinesis  Recommendations:  Case discussed with interventional cardiology, Patient unable to make a decision whether she would prefer CABG versus stenting Decision made to transfer to [facility] for further  consideration of high risk stenting, would likely need multiprocedure versus CABG which may give her the best long-lasting result given multivessel disease especially in light of previous medication intolerances -The above was discussed with family, they are in agreement __________  2D echo 08/2019: 1. Left ventricular ejection fraction, by estimation, is 60 to 65%. The  left ventricle has normal function. The left ventricle has no regional  wall motion abnormalities. Left ventricular diastolic function could not  be evaluated.  2. Right ventricular systolic function is normal. The right ventricular  size is normal.  3. The mitral valve is normal in structure. Trivial mitral valve  regurgitation. No evidence of mitral stenosis.  4. The aortic valve is normal in structure. Aortic valve regurgitation is  not visualized. No aortic stenosis is present.  5. The inferior vena cava is normal in size with greater than 50%  respiratory variability, suggesting right atrial pressure of 3 mmHg.  EKG:  EKG is ordered today.  The EKG ordered today demonstrates NSR, 68 bpm, nonspecific anterior T wave inversion  Recent Labs: 06/22/2019: B Natriuretic Peptide 76.0 08/28/2019: TSH 3.269 09/05/2019: Magnesium 2.2 09/23/2019: BUN 15; Creatinine, Ser 0.76; Hemoglobin 10.2; Platelets 397; Potassium 4.9; Sodium 143  Recent Lipid Panel    Component Value Date/Time   CHOL 178 08/27/2019 0051   CHOL 261 (H) 04/27/2019 1229   TRIG 206 (H) 08/27/2019 0051   HDL 35 (L) 08/27/2019 0051   HDL 43 04/27/2019 1229   CHOLHDL 5.1 08/27/2019 0051   VLDL 41 (H) 08/27/2019 0051   LDLCALC 102 (H) 08/27/2019 0051   LDLCALC 184 (H) 04/27/2019 1229    PHYSICAL EXAM:    VS:  BP 112/60 (BP Location: Left Arm, Patient Position: Sitting, Cuff Size: Normal)   Pulse 68   Ht 5\' 2"  (1.575 m)   Wt 176 lb 6.4 oz (80 kg)   LMP  (LMP Unknown)   SpO2 97%   BMI 32.26 kg/m   BMI: Body mass index is 32.26 kg/m.  Physical  Exam Constitutional:      Appearance: She is well-developed.  HENT:     Head: Normocephalic and atraumatic.  Eyes:     General:        Right eye: No discharge.        Left eye: No discharge.  Neck:     Vascular: No JVD.  Cardiovascular:     Rate and Rhythm: Normal rate and regular rhythm.     Pulses: No midsystolic click and no opening snap.          Posterior tibial pulses are 2+ on the right side and 2+ on the left side.     Heart sounds: Normal heart sounds, S1 normal and S2 normal. Heart sounds not distant. No murmur heard.  No friction rub.     Comments: Well-healing midline anterior chest surgical scar and right lower extremity vein harvest surgical site. Pulmonary:     Effort: Pulmonary effort is normal. No respiratory distress.     Breath sounds: Normal breath sounds. No decreased breath sounds, wheezing or rales.  Chest:     Chest wall: No tenderness.  Abdominal:     General: There is no distension.     Palpations: Abdomen is soft.     Tenderness: There is no abdominal tenderness.  Musculoskeletal:     Cervical back: Normal range of motion.  Skin:    General: Skin is warm and dry.     Nails: There is no clubbing.     Comments: Petechial rash noted along the bilateral lower extremities  Neurological:     Mental Status: She is alert and oriented to person, place, and time.  Psychiatric:        Speech: Speech normal.        Behavior: Behavior normal.        Thought Content: Thought content normal.        Judgment: Judgment normal.     Wt Readings from Last 3 Encounters:  10/09/19 176 lb 6.4 oz (80 kg)  10/07/19 176 lb 6.4 oz (80 kg)  09/23/19 180 lb (81.6 kg)     ASSESSMENT & PLAN:   1. CAD status post CABG: She is doing well without any symptoms concerning for angina.  Continue DAPT with ASA 81 mg and Plavix given NSTEMI.  She will otherwise continue Coreg, Crestor and Zetia.  She does not plan to enroll in cardiac rehab.  Continue to advance activity as  directed by CVTS.  Follow-up with cardiothoracic surgery as directed.  No plans for further ischemic evaluation at this time.  2. HFpEF: She is euvolemic and well compensated.  Her weight is down 4 pounds when compared to her last visit.  Her appetite is picking back up.  She has not requiring a standing diuretic.  Blood pressure and heart rate are well controlled.  No indication for escalation of medical therapy at this time.  CHF education.  3. HLD: LDL of 102 from 08/2019 while on Crestor 5 mg and Zetia.  At her visit earlier this month, Crestor was titrated to 10 mg daily.  She is uncertain if she made this change and plans to contact our office with her medication list.  Goal LDL less than 70.  If she has in fact been on Crestor 10 mg daily plan to repeat lipid panel when she is seen in follow-up next month.  4. Rash: Possibly related to Keflex, though it is uncertain if she is still taking this medication.  She will contact our office with an updated medication list.  Check CBC.  It is uncertain if this is related to her recent SVG vein harvest and may warrant a biopsy from her PCP for evaluation of pseudo-Kaposi sarcoma.  When CBC is resulted, please instruct patient to follow-up with PCP  for potential biopsy.   Disposition: F/u with Dr. Rockey Situ or an APP in 4 weeks.   Medication Adjustments/Labs and Tests Ordered: Current medicines are reviewed at length with the patient today.  Concerns regarding medicines are outlined above. Medication changes, Labs and Tests ordered today are summarized above and listed in the Patient Instructions accessible in Encounters.   Signed, Bonnie Faith, PA-C 10/09/2019 3:07 PM     [address] [place], [place] [id number] [phone]

## 2019-10-06 ENCOUNTER — Other Ambulatory Visit: Payer: Self-pay | Admitting: Cardiothoracic Surgery

## 2019-10-06 DIAGNOSIS — Z951 Presence of aortocoronary bypass graft: Secondary | ICD-10-CM

## 2019-10-07 ENCOUNTER — Ambulatory Visit
Admission: RE | Admit: 2019-10-07 | Discharge: 2019-10-07 | Disposition: A | Discharge status: Home / Self Care | Payer: PPO | Source: Ambulatory Visit | Attending: Cardiothoracic Surgery | Admitting: Cardiothoracic Surgery

## 2019-10-07 ENCOUNTER — Ambulatory Visit (INDEPENDENT_AMBULATORY_CARE_PROVIDER_SITE_OTHER): Payer: Self-pay | Admitting: Cardiothoracic Surgery

## 2019-10-07 ENCOUNTER — Encounter: Payer: Self-pay | Admitting: Cardiothoracic Surgery

## 2019-10-07 ENCOUNTER — Other Ambulatory Visit: Payer: Self-pay

## 2019-10-07 VITALS — BP 104/50 | HR 91 | Temp 97.0°F | Resp 16 | Ht 62.0 in | Wt 176.4 lb

## 2019-10-07 DIAGNOSIS — Z09 Encounter for follow-up examination after completed treatment for conditions other than malignant neoplasm: Secondary | ICD-10-CM | POA: Insufficient documentation

## 2019-10-07 DIAGNOSIS — J9811 Atelectasis: Secondary | ICD-10-CM | POA: Diagnosis not present

## 2019-10-07 DIAGNOSIS — J90 Pleural effusion, not elsewhere classified: Secondary | ICD-10-CM | POA: Diagnosis not present

## 2019-10-07 DIAGNOSIS — Z951 Presence of aortocoronary bypass graft: Secondary | ICD-10-CM

## 2019-10-07 DIAGNOSIS — I2510 Atherosclerotic heart disease of native coronary artery without angina pectoris: Secondary | ICD-10-CM

## 2019-10-07 MED ORDER — LIDOCAINE 5 % EX PTCH: 1.0000 | MEDICATED_PATCH | CUTANEOUS | 0 refills | Status: DC

## 2019-10-07 MED ORDER — CARVEDILOL 3.125 MG PO TABS: 3.1250 mg | ORAL_TABLET | Freq: Two times a day (BID) | ORAL | 3 refills | Status: DC

## 2019-10-07 NOTE — Progress Notes (Signed)
PCP is Albina Billet, MD Referring Provider is Minna Merritts, MD  Chief Complaint  Patient presents with  . Routine Post Op    f/u from surgery with CXR s/p Coronary artery bypass grafting x3 09/04/19...Marland Kitchenhas small red spots on both legs below the kness for about 1 1/2 weeks...they do not itchh    HPI: Scheduled postop visit 1 month after urgent multivessel CABG for unstable angina.  Patient is overweight and 79 years old and is progressing slowly but steadily.  She was seen in the cardiology office and was started on some Keflex for erythema at the saphenous vein harvest site.  She developed a rash and stopped taking the Keflex.  The cellulitis is mild and she will use some Neosporin and Band-Aid on the small incision.  The patient had a chest x-ray today which shows stable cardiac silhouette, no interstitial edema and a small left pleural effusion with sternal wires intact.  Patient is walking in the home probably a few minutes several times a day.  She is avoiding the outside heat and humidity.  Patient denies any recurrent symptoms of angina or CHF and sternal incision is healing well.  She does complain of some burning pain under the right breast which probably is neuropathic pain from the chest retraction.  There is no evidence of a yeast infection.  Past Medical History:  Diagnosis Date  . Anxiety   . Arthritis   . Cancer (HCC)    UTERINE  . Coronary artery disease   . Depression   . Dyspnea   . Heart murmur   . HOH (hard of hearing)   . Hyperlipidemia   . Hypothyroidism   . Non-STEMI (non-ST elevated myocardial infarction) [facility])     Past Surgical History:  Procedure Laterality Date  . APPENDECTOMY    . CATARACT EXTRACTION W/PHACO Left 09/17/2017   Procedure: CATARACT EXTRACTION PHACO AND INTRAOCULAR LENS PLACEMENT (IOC);  Surgeon: Birder Robson, MD;  Location: [facility] ORS;  Service: Ophthalmology;  Laterality: Left;  [place] 00:48 AP% 13.0 CDE 6.26 Fluid pack lot #  [id number]  . CATARACT EXTRACTION W/PHACO Right 10/15/2017   Procedure: CATARACT EXTRACTION PHACO AND INTRAOCULAR LENS PLACEMENT (IOC);  Surgeon: Birder Robson, MD;  Location: [facility];  Service: Ophthalmology;  Laterality: Right;  [place]  00:44 AP% 16.1 CDE 7.23 Fluid pack lot # [id number]  . CHOLECYSTECTOMY    . CORONARY ARTERY BYPASS GRAFT N/A 09/04/2019   Procedure: CORONARY ARTERY BYPASS GRAFTING (CABG), ON PUMP, TIMES THREE, LIMA TO LAD, SVG TO PDA, SVG TO OM 2;  Surgeon: Ivin Poot, MD;  Location: [place];  Service: Open Heart Surgery;  Laterality: N/A;  . ENDOVEIN HARVEST OF GREATER SAPHENOUS VEIN Right 09/04/2019   Procedure: ENDOVEIN HARVEST OF GREATER SAPHENOUS VEIN;  Surgeon: Ivin Poot, MD;  Location: [place];  Service: Open Heart Surgery;  Laterality: Right;  . LEFT HEART CATH AND CORONARY ANGIOGRAPHY N/A 08/27/2019   Procedure: LEFT HEART CATH AND CORONARY ANGIOGRAPHY;  Surgeon: Minna Merritts, MD;  Location: [place] CV LAB;  Service: Cardiovascular;  Laterality: N/A;  . TEE WITHOUT CARDIOVERSION N/A 09/04/2019   Procedure: TRANSESOPHAGEAL ECHOCARDIOGRAM (TEE);  Surgeon: Prescott Gum, Collier Salina, MD;  Location: [place];  Service: Open Heart Surgery;  Laterality: N/A;    Family History  Problem Relation Age of Onset  . Heart attack Father   . Emphysema Mother   . Heart disease Mother     Social History Social History  Tobacco Use  . Smoking status: Never Smoker  . Smokeless tobacco: Never Used  Vaping Use  . Vaping Use: Never used  Substance Use Topics  . Alcohol use: Not Currently  . Drug use: No    Current Outpatient Medications  Medication Sig Dispense Refill  . acetaminophen (TYLENOL) 500 MG tablet Take 500 mg by mouth every 6 (six) hours as needed for moderate pain or headache.    Marland Kitchen aspirin EC 81 MG tablet Take 1 tablet (81 mg total) by mouth daily. Swallow whole. 90 tablet 3  . clopidogrel (PLAVIX) 75 MG tablet Take 1 tablet (75 mg total) by mouth daily. 30  tablet 11  . escitalopram (LEXAPRO) 10 MG tablet Take 10 mg by mouth daily.     Marland Kitchen ezetimibe (ZETIA) 10 MG tablet Take 1 tablet by mouth once daily 90 tablet 0  . levothyroxine (EUTHYROX) 50 MCG tablet Take 50 mcg by mouth daily before breakfast.    . nitroGLYCERIN (NITROSTAT) 0.4 MG SL tablet Place 1 tablet (0.4 mg total) under the tongue every 5 (five) minutes x 3 doses as needed for chest pain. 90 tablet 0  . rosuvastatin (CRESTOR) 10 MG tablet Take 1 tablet (10 mg total) by mouth daily. 90 tablet 3  . [START ON 10/08/2019] carvedilol (COREG) 3.125 MG tablet Take 1 tablet (3.125 mg total) by mouth 2 (two) times daily with a meal. 60 tablet 3  . lidocaine (LIDODERM) 5 % Place 1 patch onto the skin daily. Remove & Discard patch within 12 hours or as directed by MD Apply to burning painful area in right anterior chest 30 patch 0   No current facility-administered medications for this visit.    Allergies  Allergen Reactions  . Statins Other (See Comments)    Muscle weakness    Review of Systems  Appetite poor weight stable No fever Mild ankle edema No symptoms of COVID-19-fever cough increased shortness of breath or loss of taste or smell  BP (!) 104/50 (BP Location: Left Arm, Patient Position: Sitting, Cuff Size: Large)   Pulse 91   Temp (!) 97 F (36.1 C)   Resp 16   Ht 5\' 2"  (1.575 m)   Wt 176 lb 6.4 oz (80 kg)   LMP  (LMP Unknown)   BMI 32.26 kg/m  Physical Exam      Exam    General- alert and comfortable.  Surgical incisions healing adequately    Neck- no JVD, no cervical adenopathy palpable, no carotid bruit   Lungs- clear without rales, wheezes   Cor- regular rate and rhythm, no murmur , gallop   Abdomen- soft, non-tender   Extremities - warm, non-tender, minimal edema   Neuro- oriented, appropriate, no focal weakness   Diagnostic Tests: Chest x-ray clear with blunting of left costophrenic angle from a small effusion  Impression: Progressing slowly after  multivessel CABG but not unexpectedly. She is encouraged to walk 10 minutes twice a day and to walk outside once the heat and humidity improved. She knows she can now lift up to 10 pounds maximum. She is not interested in entering cardiac rehab at this point.  I will add low-dose carvedilol 3.125 mg twice daily since her heart rate is now up 80-90 bpm  She will return for reexam and chest x-ray in 4 weeks.  Plan: Return with chest x-ray in 4 weeks.  Continue with heart healthy lifestyle. Hold Keflex for possible allergic response.  Len Childs, MD [facility] [phone]

## 2019-10-09 ENCOUNTER — Encounter: Payer: Self-pay | Admitting: Physician Assistant

## 2019-10-09 ENCOUNTER — Telehealth: Payer: Self-pay

## 2019-10-09 ENCOUNTER — Other Ambulatory Visit: Payer: Self-pay

## 2019-10-09 ENCOUNTER — Ambulatory Visit: Payer: PPO | Admitting: Physician Assistant

## 2019-10-09 VITALS — BP 112/60 | HR 68 | Ht 62.0 in | Wt 176.4 lb

## 2019-10-09 DIAGNOSIS — E785 Hyperlipidemia, unspecified: Secondary | ICD-10-CM | POA: Diagnosis not present

## 2019-10-09 DIAGNOSIS — I2510 Atherosclerotic heart disease of native coronary artery without angina pectoris: Secondary | ICD-10-CM | POA: Diagnosis not present

## 2019-10-09 DIAGNOSIS — Z951 Presence of aortocoronary bypass graft: Secondary | ICD-10-CM

## 2019-10-09 DIAGNOSIS — I5032 Chronic diastolic (congestive) heart failure: Secondary | ICD-10-CM | POA: Diagnosis not present

## 2019-10-09 DIAGNOSIS — R21 Rash and other nonspecific skin eruption: Secondary | ICD-10-CM | POA: Diagnosis not present

## 2019-10-09 NOTE — Patient Instructions (Signed)
Medication Instructions:  1- Do not take any more keflex *If you need a refill on your cardiac medications before your next appointment, please call your pharmacy*   Lab Work: Your physician recommends that you have lab work today(CBC, BMET) If you have labs (blood work) drawn today and your tests are completely normal, you will receive your results only by: Marland Kitchen MyChart Message (if you have MyChart) OR . A paper copy in the mail If you have any lab test that is abnormal or we need to change your treatment, we will call you to review the results.   Testing/Procedures: None ordered   Follow-Up: At [facility], you and your health needs are our priority.  As part of our continuing mission to provide you with exceptional heart care, we have created designated Provider Care Teams.  These Care Teams include your primary Cardiologist (physician) and Advanced Practice Providers (APPs -  Physician Assistants and Nurse Practitioners) who all work together to provide you with the care you need, when you need it.  We recommend signing up for the patient portal called "MyChart".  Sign up information is provided on this After Visit Summary.  MyChart is used to connect with patients for Virtual Visits (Telemedicine).  Patients are able to view lab/test results, encounter notes, upcoming appointments, etc.  Non-urgent messages can be sent to your provider as well.   To learn more about what you can do with MyChart, go to [email or web address].    Your next appointment:   4 week(s)  The format for your next appointment:   In Person  Provider:    You may see Ida Rogue, MD or Christell Faith, PA-C  Please call us with medication list

## 2019-10-09 NOTE — Telephone Encounter (Signed)
Reviewed OV AVS with patient and walked her to check out. I went to walk away and was asked to come back to assess patient d/t dizziness and "loss of feeling in legs".   Pt was assisted to sitting position. Pt oriented to self, person, place and year. Facial symmetry and speech WNL. Extremity strength WNL.   Pt denies any pain, no headache chest pain or SOB. I got some ice water for patient. She reported feeling back to baseline after rest and felt that she "got up too quickly".   Christell Faith, PA made aware. He suggested I offer ED to patient if she would like or she can continue to assess over the weekend and get urgent medical care as needed.   Pt declined ED and felt she would be safe leaving with her sister.   Pt stood and felt fine. She ambulated out of clinic with sister with no difficulty.   Advised pt to call for any further questions or concerns.

## 2019-10-10 LAB — BASIC METABOLIC PANEL
BUN/Creatinine Ratio: 25 (ref 12–28)
BUN: 18 mg/dL (ref 8–27)
CO2: 23 mmol/L (ref 20–29)
Calcium: 9.5 mg/dL (ref 8.7–10.3)
Chloride: 106 mmol/L (ref 96–106)
Creatinine, Ser: 0.73 mg/dL (ref 0.57–1.00)
GFR calc Af Amer: 90 mL/min/{1.73_m2} (ref >59)
GFR calc non Af Amer: 78 mL/min/{1.73_m2} (ref >59)
Glucose: 98 mg/dL (ref 65–99)
Potassium: 4.3 mmol/L (ref 3.5–5.2)
Sodium: 141 mmol/L (ref 134–144)

## 2019-10-10 LAB — CBC
Hematocrit: 34.4 % (ref 34.0–46.6)
Hemoglobin: 11.1 g/dL (ref 11.1–15.9)
MCH: 29 pg (ref 26.6–33.0)
MCHC: 32.3 g/dL (ref 31.5–35.7)
MCV: 90 fL (ref 79–97)
Platelets: 281 10*3/uL (ref 150–450)
RBC: 3.83 x10E6/uL (ref 3.77–5.28)
RDW: 15.3 % (ref 11.7–15.4)
WBC: 6.5 10*3/uL (ref 3.4–10.8)

## 2019-10-13 ENCOUNTER — Telehealth: Payer: Self-pay | Admitting: Cardiovascular Disease

## 2019-10-13 NOTE — Telephone Encounter (Signed)
Pt c/o medication issue:  1. Name of Medication: rosuvastatin   2. How are you currently taking this medication (dosage and times per day)?  says she has a 5 mg bottle and a 10 mg bottle - not sure which she is taking   3. Are you having a reaction (difficulty breathing--STAT)? Legs look bad, diarrhea   4. What is your medication issue? Patient calling, states that this medication is messing with her stomach and she is having issues with her legs.  Please call to discuss.

## 2019-10-14 DIAGNOSIS — D692 Other nonthrombocytopenic purpura: Secondary | ICD-10-CM | POA: Diagnosis not present

## 2019-10-14 DIAGNOSIS — D690 Allergic purpura: Secondary | ICD-10-CM | POA: Diagnosis not present

## 2019-10-14 NOTE — Telephone Encounter (Signed)
Dr. Hall Busing called to review rash that patient has to her lower extremities. He inquired if this could be from the plavix and will text Dr. Rockey Situ a picture of this. Let him know that we would give him a call back. He verbalized understanding and will wait to hear back.

## 2019-10-14 NOTE — Telephone Encounter (Signed)
Spoke with Dr. Hall Busing and reviewed information I discovered on when it was started, by whom, and pharmacy report of incidence 1-2% with that medication. He wants Dr. Rockey Situ to give him a call when able to talk about rash and give a better picture of what is going on. Number to call for him is [phone].

## 2019-10-14 NOTE — Telephone Encounter (Signed)
Reviewed patients chart and Clopidogrel (Plavix) was started during her hospital stay by Enid Cutter PA-C with [facility]. Patient had CABG and during that stay is when they started this medication on 09/11/19. Will send this information over to Dr. Rockey Situ & Dr. Hall Busing.

## 2019-10-14 NOTE — Telephone Encounter (Signed)
Spoke with patient and her spouse regarding medication questions. Reviewed that rosuvastatin is 10 mg and if they want to use up the 5 mg pills she would just need to take 2 tablets daily. He also inquired about any potential reaction between the Lexapro and Plavix. Then he reported she has rash that has not improved. Advised that if rash has gotten worse she should probably go to see her primary care provider so they can determine what may be causing this. Will send over to pharmacy for review but did advise them to also call primary regarding the persistent rash. He verbalized understanding of our conversation with no further questions at this time.

## 2019-10-14 NOTE — Telephone Encounter (Signed)
Lexapro has antiplatelet properties. Pt can take it with Plavix but should monitor for bruising/bleeding out of the ordinary.

## 2019-10-14 NOTE — Telephone Encounter (Signed)
Spoke with patient and reviewed recommendations to hold plavix and continue aspirin for now. She verbalized understanding with no further questions at this time.

## 2019-10-14 NOTE — Telephone Encounter (Signed)
I discussed case with Dr. Hall Busing Patient has a significant rash, we will hold the Plavix, lab work has been ordered by primary care Continue aspirin for now

## 2019-11-02 NOTE — Progress Notes (Signed)
Cardiology Office Note    Date:  11/06/2019   ID:  Bonnie Hancock, DOB 02-05-1941, MRN [id number]  PCP:  Albina Billet, MD  Cardiologist:  Ida Rogue, MD  Electrophysiologist:  None   Chief Complaint: Follow up  History of Present Illness:   Bonnie Hancock is a 79 y.o. female with history of CAD status post three-vessel CABG on 09/04/2019,HFpEF, hyperlipidemia, hypothyroidism, obesity, arthritis, depression, and anxiety presents for follow up of her CAD.  Remote echo from 2012 showed normal LV systolic function, mild pulmonary pretension, and no acute valvular abnormality. More recently, she was seen in the ED in 06/2019 with chest pain. High-sensitivity troponin negative x2, D-dimer elevated with CTA of the chest negative for PE with incidentally noted pulmonary nodule. Infollow-up, ischemic evaluation was declined. Following this, she was admitted to [facility] on 08/26/2019 with chest pain that began while riding in a car. Initial high-sensitivity troponin VII 100 with a delta of 1155 and eventually peaking at nearly 9000. EKG showed sinus bradycardia with nonspecific ST-T changes. 2D echo on 08/26/2019 showed an EF of 60 to 65%, no regional wall motion abnormalities, normal RV systolic function and RV cavity size, and trivial mitral regurgitation. She underwent LHC on 08/27/2019 which showed significant three-vessel CAD including the distal left main, ostial LAD, mid LCx, mid RCA, and ostial to proximal OM with a mildly depressed LVEF of 45 to 50% with inferior wall hypokinesis. She was subsequently transferred to [facility] for consideration of CABG versus multivessel PCI. After review from interventional cardiology was felt her coronary artery disease was not ideal for PCI as outlined in the discharge summary from her hospitalization. She was evaluated by cardiothoracic surgery and subsequently underwent three-vessel CABG on 09/04/2019 with LIMA to LAD, SVG to OM, and SVG to PDA.  Pre-operative carotid artery ultrasound showed 1-39% bilateral carotid artery stenosis and antegrade flow of the bilateral vertebral arteries. No significant changes in her poetoperative TEE. Her postoperative course was complicated by diarrhea.Metoprolol was held with bradycardia. Discharge weight 81.8 kg.   She was seen in follow up on 09/23/2019, and was doing well from a cardiac perspective.  After discussion with her surgeon, ASA was reduced to 81 mg daily given she was also on Plavix.  There was some mild erythema along the superior aspect of her SVG harvest site without purulent drainage and she was placed on a short course of Keflex.  Her weight was down 5 pounds when compared to her prior clinic weight.   She was last seen in the office on 10/09/2019 and was doing well.  She noted a rash along her bilateral lower extremities (noted at her CVTS appointment as well).  There was some medication confusion at that time and she was advised to contact us with her current medications for further recommendations.  Ultimately, she followed up with her PCP who felt the rash was related to Plavix and this was held after discussion with her primary cardiologist.   She was seen by CVTS on 11/04/2019 to continue to heal well from a surgical perspective.  She was having intermittent episodes of tremors and generalized shaking with associated weakness.  Carvedilol was discontinued.  Head CT is pending.  She comes in doing well from a cardiac perspective.  Since she was last seen, she denies any chest pain, palpitations, dyspnea, dizziness, presyncope, or syncope.  No lower extremity swelling, abdominal distention, orthopnea, PND, early satiety.  Her activity level and functional status continues to  improve.  Her appetite is improving.  She continues to escalate her activity level without cardiac limitation.  Since discontinuing Plavix, her lower extremity rash has nearly resolved.  She remains on aspirin.  In total,  she has had 3 episodes of sudden onset left-sided tremors with generalized shaking and associated fogginess/generalized weakness.  Her first episode was on 8/20 in our waiting room after her visit with her next episode occurring on 9/12 and her last and most recent episode occurring during her CVTS visit on 9/15.  With these episodes, she is unaware they are happening and denies any surrounding chest pain, dyspnea, palpitations, or dizziness.   Labs independently reviewed: 10/2019 - TSH normal, BUN 16, serum creatinine 0.77, potassium 4.0, albumin 3.6, AST/ALT normal 09/2019 - HGB 10.2, PLT 397 08/2019-magnesium 2.2, A1c 5.9, TC 178, TG 206, HDL 35, LDL 102    Past Medical History:  Diagnosis Date  . Anxiety   . Arthritis   . Cancer (HCC)    UTERINE  . Coronary artery disease   . Depression   . Dyspnea   . Heart murmur   . HOH (hard of hearing)   . Hyperlipidemia   . Hypothyroidism   . Non-STEMI (non-ST elevated myocardial infarction) [facility])     Past Surgical History:  Procedure Laterality Date  . APPENDECTOMY    . CATARACT EXTRACTION W/PHACO Left 09/17/2017   Procedure: CATARACT EXTRACTION PHACO AND INTRAOCULAR LENS PLACEMENT (IOC);  Surgeon: Birder Robson, MD;  Location: [facility] ORS;  Service: Ophthalmology;  Laterality: Left;  [place] 00:48 AP% 13.0 CDE 6.26 Fluid pack lot # [id number]  . CATARACT EXTRACTION W/PHACO Right 10/15/2017   Procedure: CATARACT EXTRACTION PHACO AND INTRAOCULAR LENS PLACEMENT (IOC);  Surgeon: Birder Robson, MD;  Location: [facility] ORS;  Service: Ophthalmology;  Laterality: Right;  [place]  00:44 AP% 16.1 CDE 7.23 Fluid pack lot # [id number]  . CHOLECYSTECTOMY    . CORONARY ARTERY BYPASS GRAFT N/A 09/04/2019   Procedure: CORONARY ARTERY BYPASS GRAFTING (CABG), ON PUMP, TIMES THREE, LIMA TO LAD, SVG TO PDA, SVG TO OM 2;  Surgeon: Ivin Poot, MD;  Location: [place];  Service: Open Heart Surgery;  Laterality: N/A;  . ENDOVEIN HARVEST OF GREATER SAPHENOUS VEIN  Right 09/04/2019   Procedure: ENDOVEIN HARVEST OF GREATER SAPHENOUS VEIN;  Surgeon: Ivin Poot, MD;  Location: [place];  Service: Open Heart Surgery;  Laterality: Right;  . LEFT HEART CATH AND CORONARY ANGIOGRAPHY N/A 08/27/2019   Procedure: LEFT HEART CATH AND CORONARY ANGIOGRAPHY;  Surgeon: Minna Merritts, MD;  Location: [place] [facility];  Service: Cardiovascular;  Laterality: N/A;  . TEE WITHOUT CARDIOVERSION N/A 09/04/2019   Procedure: TRANSESOPHAGEAL ECHOCARDIOGRAM (TEE);  Surgeon: Prescott Gum, Collier Salina, MD;  Location: [place];  Service: Open Heart Surgery;  Laterality: N/A;    Current Medications: Current Meds  Medication Sig  . acetaminophen (TYLENOL) 500 MG tablet Take 500 mg by mouth every 6 (six) hours as needed for moderate pain or headache.  Marland Kitchen aspirin EC 81 MG tablet Take 1 tablet (81 mg total) by mouth daily. Swallow whole.  . escitalopram (LEXAPRO) 10 MG tablet Take 10 mg by mouth daily.   Marland Kitchen ezetimibe (ZETIA) 10 MG tablet Take 1 tablet by mouth once daily  . levothyroxine (EUTHYROX) 50 MCG tablet Take 50 mcg by mouth daily before breakfast.  . lidocaine (LIDODERM) 5 % Place 1 patch onto the skin daily. Remove & Discard patch within 12 hours or as directed by MD Apply  to burning painful area in right anterior chest  . nitroGLYCERIN (NITROSTAT) 0.4 MG SL tablet Place 1 tablet (0.4 mg total) under the tongue every 5 (five) minutes x 3 doses as needed for chest pain.  . rosuvastatin (CRESTOR) 10 MG tablet Take 1 tablet (10 mg total) by mouth daily.  . [DISCONTINUED] carvedilol (COREG) 3.125 MG tablet Take 1 tablet (3.125 mg total) by mouth 2 (two) times daily with a meal.    Allergies:   Statins   Social History   Socioeconomic History  . Marital status: Married    Spouse name: Not on file  . Number of children: 4  . Years of education: Not on file  . Highest education level: Not on file  Occupational History  . Occupation: retired  Tobacco Use  . Smoking status: Never  Smoker  . Smokeless tobacco: Never Used  Vaping Use  . Vaping Use: Never used  Substance and Sexual Activity  . Alcohol use: Not Currently  . Drug use: No  . Sexual activity: Not on file  Other Topics Concern  . Not on file  Social History Narrative  . Not on file   Social Determinants of Health   Financial Resource Strain:   . Difficulty of Paying Living Expenses: Not on file  Food Insecurity:   . Worried About Charity fundraiser in the Last Year: Not on file  . Ran Out of Food in the Last Year: Not on file  Transportation Needs:   . Lack of Transportation (Medical): Not on file  . Lack of Transportation (Non-Medical): Not on file  Physical Activity:   . Days of Exercise per Week: Not on file  . Minutes of Exercise per Session: Not on file  Stress:   . Feeling of Stress : Not on file  Social Connections:   . Frequency of Communication with Friends and Family: Not on file  . Frequency of Social Gatherings with Friends and Family: Not on file  . Attends Religious Services: Not on file  . Active Member of Clubs or Organizations: Not on file  . Attends Archivist Meetings: Not on file  . Marital Status: Not on file     Family History:  The patient's family history includes Emphysema in her mother; Heart attack in her father; Heart disease in her mother.  ROS:   Review of Systems  Constitutional: Positive for malaise/fatigue. Negative for chills, diaphoresis, fever and weight loss.  HENT: Negative for congestion.   Eyes: Negative for discharge and redness.  Respiratory: Negative for cough, hemoptysis, sputum production, shortness of breath and wheezing.   Cardiovascular: Negative for chest pain, palpitations, orthopnea, claudication, leg swelling and PND.  Gastrointestinal: Negative for abdominal pain, blood in stool, heartburn, melena, nausea and vomiting.  Genitourinary: Negative for hematuria.  Musculoskeletal: Positive for neck pain. Negative for falls and  myalgias.  Skin: Negative for rash.  Neurological: Positive for tremors, weakness and headaches. Negative for dizziness, tingling, sensory change, speech change, focal weakness and loss of consciousness.  Endo/Heme/Allergies: Does not bruise/bleed easily.  Psychiatric/Behavioral: Negative for substance abuse. The patient is not nervous/anxious.   All other systems reviewed and are negative.    EKGs/Labs/Other Studies Reviewed:    Studies reviewed were summarized above. The additional studies were reviewed today:  Intraoperative TEE 08/2019: POST-OP IMPRESSIONS  - Left Ventricle: The left ventricle is unchanged from pre-bypass.  - Aorta: The aorta appears unchanged from pre-bypass.  - Left Atrial Appendage: The left atrial  appendage appears unchanged from  pre-bypass.  - Aortic Valve: The aortic valve appears unchanged from pre-bypass.  - Mitral Valve: Slight improvement of MR post.  - Tricuspid Valve: The tricuspid valve appears unchanged from pre-bypass. __________  Pre-CABG carotid, upper and extremity ABIs 08/2019: Right Carotid: Velocities in the right ICA are consistent with a 1-39%  stenosis.   Left Carotid: Velocities in the left ICA are consistent with a 1-39%  stenosis.  Vertebrals: Bilateral vertebral arteries demonstrate antegrade flow.   Right Upper Extremity: Doppler waveforms remain within normal limits with  right radial compression. Doppler waveform obliterate with right ulnar  compression.  Left Upper Extremity: Doppler waveform obliterate with left radial  compression. Doppler waveforms remain within normal limits with left ulnar  compression.  __________  LHC 08/2019: Coronary dominance: Right  Left mainstem: Large vessel that bifurcates into the LAD and left circumflex, moderate to severe calcified distal left main disease estimated 70%  Left anterior descending (LAD): Large vessel that extends to the apical region, diagonal branch 2 of moderate  size, ostial/proximal LAD 80%, heavily calcified, 50% mid LAD disease after diagonal branch  Left circumflex (LCx): Large vessel with OM branch 2, mid vessel with 60% diffuse disease, OM 2 with 60 to 70% ostial disease  Right coronary artery (RCA): Right dominant vessel with PL and PDA, likely culprit vessel for non-STEMI, 99% mid vessel stenosis, long region  Left ventriculography: Left ventricular systolic function is mildly depressed 45 to 50%, inferior wall hypokinesis there is no significant mitral regurgitation , no significant aortic valve stenosis  Final Conclusions:  Three-vessel coronary disease There is distal left main, ostial LAD, mid left circumflex, mid RCA disease, ostial/proximal OM disease Mildly depressed ejection fraction 45 to 50% with inferior wall hypokinesis  Recommendations:  Case discussed with interventional cardiology, Patient unable to make a decision whether she would prefer CABG versus stenting Decision made to transfer to [facility] for further consideration of high risk stenting, would likely need multiprocedure versus CABG which may give her the best long-lasting result given multivessel disease especially in light of previous medication intolerances -The above was discussed with family, they are in agreement __________  2D echo 08/2019: 1. Left ventricular ejection fraction, by estimation, is 60 to 65%. The  left ventricle has normal function. The left ventricle has no regional  wall motion abnormalities. Left ventricular diastolic function could not  be evaluated.  2. Right ventricular systolic function is normal. The right ventricular  size is normal.  3. The mitral valve is normal in structure. Trivial mitral valve  regurgitation. No evidence of mitral stenosis.  4. The aortic valve is normal in structure. Aortic valve regurgitation is  not visualized. No aortic stenosis is present.  5. The inferior vena cava is normal in size with greater than  50%  respiratory variability, suggesting right atrial pressure of 3 mmHg.   EKG:  EKG is ordered today.  The EKG ordered today demonstrates NSR, 60 bpm, no acute ST-T changes  Recent Labs: 06/22/2019: B Natriuretic Peptide 76.0 09/05/2019: Magnesium 2.2 10/09/2019: Hemoglobin 11.1; Platelets 281 11/04/2019: ALT 10; BUN 16; Creat 0.77; Potassium 4.0; Sodium 141; TSH 0.95  Recent Lipid Panel    Component Value Date/Time   CHOL 178 08/27/2019 0051   CHOL 261 (H) 04/27/2019 1229   TRIG 206 (H) 08/27/2019 0051   HDL 35 (L) 08/27/2019 0051   HDL 43 04/27/2019 1229   CHOLHDL 5.1 08/27/2019 0051   VLDL 41 (H) 08/27/2019 0051  LDLCALC 102 (H) 08/27/2019 0051   LDLCALC 184 (H) 04/27/2019 1229    PHYSICAL EXAM:    VS:  BP 130/62 (BP Location: Left Arm, Patient Position: Sitting, Cuff Size: Normal)   Pulse 60   Ht 5\' 2"  (1.575 m)   Wt 174 lb (78.9 kg)   LMP  (LMP Unknown)   SpO2 98%   BMI 31.83 kg/m   BMI: Body mass index is 31.83 kg/m.  Physical Exam Constitutional:      Appearance: She is well-developed.  HENT:     Head: Normocephalic and atraumatic.  Eyes:     General:        Right eye: No discharge.        Left eye: No discharge.  Neck:     Vascular: No JVD.  Cardiovascular:     Rate and Rhythm: Normal rate and regular rhythm.     Pulses: No midsystolic click and no opening snap.          Posterior tibial pulses are 2+ on the right side and 2+ on the left side.     Heart sounds: Normal heart sounds, S1 normal and S2 normal. Heart sounds not distant. No murmur heard.  No friction rub.  Pulmonary:     Effort: Pulmonary effort is normal. No respiratory distress.     Breath sounds: Normal breath sounds. No decreased breath sounds, wheezing or rales.  Chest:     Chest wall: No tenderness.  Abdominal:     General: There is no distension.     Palpations: Abdomen is soft.     Tenderness: There is no abdominal tenderness.  Musculoskeletal:     Cervical back: Normal range of  motion.  Skin:    General: Skin is warm and dry.     Nails: There is no clubbing.     Comments: Resolving rash along the bilateral lower extremities  Neurological:     Mental Status: She is alert and oriented to person, place, and time.  Psychiatric:        Speech: Speech normal.        Behavior: Behavior normal.        Thought Content: Thought content normal.        Judgment: Judgment normal.     Wt Readings from Last 3 Encounters:  11/06/19 174 lb (78.9 kg)  11/04/19 174 lb (78.9 kg)  10/09/19 176 lb 6.4 oz (80 kg)     ASSESSMENT & PLAN:   1. CAD status post CABG: She continues to progress quite nicely from a cardiac perspective.  No symptoms concerning for angina.  No longer on Plavix secondary to possible rash.  Follow-up CBC unrevealing.  She remains on aspirin, Crestor, and Zetia.  No longer on carvedilol given the below generalized weakness.  No plans for further ischemic evaluation at this time.  2. HFpEF: She appears euvolemic and well compensated.  Recent chest x-ray showed no acute process with a minimal residual left pleural effusion.  She is not requiring a standing diuretic.  Optimal blood pressure and heart rate control recommended, both of which are controlled in the office today.  3. Left-sided tremors/generalized weakness/possible ataxia: Of uncertain etiology.  Laboratory evaluation with CVTS was unrevealing.  She is scheduled for a head CT later this month.  With her history of recent bypass and concern for atrial arrhythmia burden, we will place a Zio patch to evaluate for any arrhythmia noted in conjunction with these episodes should she have any  further.  If this is unrevealing, she may need neurology evaluation.  4. HLD: LDL of 102 from 08/2019 while on Crestor 5 mg and Zetia.    5. Rash: Resolving off Plavix.  Disposition: F/u with Dr. Rockey Situ or an APP in 2 months.   Medication Adjustments/Labs and Tests Ordered: Current medicines are reviewed at length  with the patient today.  Concerns regarding medicines are outlined above. Medication changes, Labs and Tests ordered today are summarized above and listed in the Patient Instructions accessible in Encounters.   Signed, Christell Faith, PA-C 11/06/2019 3:08 PM     [address], [place] [address] [phone]

## 2019-11-03 ENCOUNTER — Other Ambulatory Visit: Payer: Self-pay | Admitting: Cardiothoracic Surgery

## 2019-11-03 DIAGNOSIS — Z951 Presence of aortocoronary bypass graft: Secondary | ICD-10-CM

## 2019-11-04 ENCOUNTER — Ambulatory Visit (INDEPENDENT_AMBULATORY_CARE_PROVIDER_SITE_OTHER): Payer: Self-pay | Admitting: Cardiothoracic Surgery

## 2019-11-04 ENCOUNTER — Encounter: Payer: Self-pay | Admitting: Cardiothoracic Surgery

## 2019-11-04 ENCOUNTER — Other Ambulatory Visit: Payer: Self-pay | Admitting: Cardiothoracic Surgery

## 2019-11-04 ENCOUNTER — Ambulatory Visit
Admission: RE | Admit: 2019-11-04 | Discharge: 2019-11-04 | Disposition: A | Discharge status: Home / Self Care | Payer: PPO | Source: Ambulatory Visit | Attending: Cardiothoracic Surgery | Admitting: Cardiothoracic Surgery

## 2019-11-04 ENCOUNTER — Other Ambulatory Visit: Payer: Self-pay

## 2019-11-04 VITALS — BP 115/64 | HR 63 | Temp 97.6°F | Resp 20 | Ht 62.0 in | Wt 174.0 lb

## 2019-11-04 DIAGNOSIS — Z951 Presence of aortocoronary bypass graft: Secondary | ICD-10-CM | POA: Diagnosis not present

## 2019-11-04 DIAGNOSIS — R0602 Shortness of breath: Secondary | ICD-10-CM | POA: Diagnosis not present

## 2019-11-04 DIAGNOSIS — I2510 Atherosclerotic heart disease of native coronary artery without angina pectoris: Secondary | ICD-10-CM

## 2019-11-04 DIAGNOSIS — Z4889 Encounter for other specified surgical aftercare: Secondary | ICD-10-CM | POA: Insufficient documentation

## 2019-11-04 LAB — COMPREHENSIVE METABOLIC PANEL
AG Ratio: 1.2 (calc) (ref 1.0–2.5)
ALT: 10 U/L (ref 6–29)
AST: 14 U/L (ref 10–35)
Albumin: 3.6 g/dL (ref 3.6–5.1)
Alkaline phosphatase (APISO): 52 U/L (ref 37–153)
BUN: 16 mg/dL (ref 7–25)
CO2: 26 mmol/L (ref 20–32)
Calcium: 9.1 mg/dL (ref 8.6–10.4)
Chloride: 109 mmol/L (ref 98–110)
Creat: 0.77 mg/dL (ref 0.60–0.88)
Globulin: 2.9 g/dL (calc) (ref 1.9–3.7)
Glucose, Bld: 86 mg/dL (ref 65–139)
Potassium: 4 mmol/L (ref 3.5–5.3)
Sodium: 141 mmol/L (ref 135–146)
Total Bilirubin: 0.6 mg/dL (ref 0.2–1.2)
Total Protein: 6.5 g/dL (ref 6.1–8.1)

## 2019-11-04 LAB — THYROID PANEL WITH TSH
Free Thyroxine Index: 2.5 (ref 1.4–3.8)
T3 Uptake: 30 % (ref 22–35)
T4, Total: 8.4 ug/dL (ref 5.1–11.9)
TSH: 0.95 mIU/L (ref 0.40–4.50)

## 2019-11-04 NOTE — Progress Notes (Signed)
PCP is Albina Billet, MD Referring Provider is Minna Merritts, MD  Chief Complaint  Patient presents with  . Routine Post Op    3 week f/u with CXR    HPI: Scheduled follow-up over 2 months following multivessel CABG. she has progressed significantly since her last visit.  Chest x-ray today is clear.  Surgical incisions are well-healed.  She has no symptoms of angina or CHF.  She has no significant peripheral edema.  She is able to walk independently without difficulty.  Since her last visit she has developed a rash and her Plavix has been stopped.  The rash has shown improvement.  It appears to be small telangiectasia-like lesions.  Postop check  Since her last visit she was started on low-dose carvedilol since she had a low heart rate and could not tolerate postop beta-blocker.  Over the past 2 weeks she has had few isolated incidents those of tremors and generalized shaking associated with general weakness.  No change in vision speech or incontinence.  While I was examining her in the office she developed 1 of these episodes which could have been a stress reaction.  However we will proceed with checking her electrolytes, liver function and thyroid function as well as a head CT scan.  We will hold her carvedilol.   Past Medical History:  Diagnosis Date  . Anxiety   . Arthritis   . Cancer (HCC)    UTERINE  . Coronary artery disease   . Depression   . Dyspnea   . Heart murmur   . HOH (hard of hearing)   . Hyperlipidemia   . Hypothyroidism   . Non-STEMI (non-ST elevated myocardial infarction) [facility])     Past Surgical History:  Procedure Laterality Date  . APPENDECTOMY    . CATARACT EXTRACTION W/PHACO Left 09/17/2017   Procedure: CATARACT EXTRACTION PHACO AND INTRAOCULAR LENS PLACEMENT (IOC);  Surgeon: Birder Robson, MD;  Location: [facility];  Service: Ophthalmology;  Laterality: Left;  [place] 00:48 AP% 13.0 CDE 6.26 Fluid pack lot # [id number]  . CATARACT EXTRACTION W/PHACO Right  10/15/2017   Procedure: CATARACT EXTRACTION PHACO AND INTRAOCULAR LENS PLACEMENT (IOC);  Surgeon: Birder Robson, MD;  Location: [facility];  Service: Ophthalmology;  Laterality: Right;  [place]  00:44 AP% 16.1 CDE 7.23 Fluid pack lot # [id number]  . CHOLECYSTECTOMY    . CORONARY ARTERY BYPASS GRAFT N/A 09/04/2019   Procedure: CORONARY ARTERY BYPASS GRAFTING (CABG), ON PUMP, TIMES THREE, LIMA TO LAD, SVG TO PDA, SVG TO OM 2;  Surgeon: Ivin Poot, MD;  Location: [place];  Service: Open Heart Surgery;  Laterality: N/A;  . ENDOVEIN HARVEST OF GREATER SAPHENOUS VEIN Right 09/04/2019   Procedure: ENDOVEIN HARVEST OF GREATER SAPHENOUS VEIN;  Surgeon: Ivin Poot, MD;  Location: [place];  Service: Open Heart Surgery;  Laterality: Right;  . LEFT HEART CATH AND CORONARY ANGIOGRAPHY N/A 08/27/2019   Procedure: LEFT HEART CATH AND CORONARY ANGIOGRAPHY;  Surgeon: Minna Merritts, MD;  Location: [place] [facility];  Service: Cardiovascular;  Laterality: N/A;  . TEE WITHOUT CARDIOVERSION N/A 09/04/2019   Procedure: TRANSESOPHAGEAL ECHOCARDIOGRAM (TEE);  Surgeon: Prescott Gum, Collier Salina, MD;  Location: [place];  Service: Open Heart Surgery;  Laterality: N/A;    Family History  Problem Relation Age of Onset  . Heart attack Father   . Emphysema Mother   . Heart disease Mother     Social History Social History   Tobacco Use  . Smoking status:  Never Smoker  . Smokeless tobacco: Never Used  Vaping Use  . Vaping Use: Never used  Substance Use Topics  . Alcohol use: Not Currently  . Drug use: No    Current Outpatient Medications  Medication Sig Dispense Refill  . acetaminophen (TYLENOL) 500 MG tablet Take 500 mg by mouth every 6 (six) hours as needed for moderate pain or headache.    Marland Kitchen aspirin EC 81 MG tablet Take 1 tablet (81 mg total) by mouth daily. Swallow whole. 90 tablet 3  . carvedilol (COREG) 3.125 MG tablet Take 1 tablet (3.125 mg total) by mouth 2 (two) times daily with a meal. 60 tablet 3  .  escitalopram (LEXAPRO) 10 MG tablet Take 10 mg by mouth daily.     Marland Kitchen ezetimibe (ZETIA) 10 MG tablet Take 1 tablet by mouth once daily 90 tablet 0  . levothyroxine (EUTHYROX) 50 MCG tablet Take 50 mcg by mouth daily before breakfast.    . rosuvastatin (CRESTOR) 10 MG tablet Take 1 tablet (10 mg total) by mouth daily. 90 tablet 3  . lidocaine (LIDODERM) 5 % Place 1 patch onto the skin daily. Remove & Discard patch within 12 hours or as directed by MD Apply to burning painful area in right anterior chest (Patient not taking: Reported on 11/04/2019) 30 patch 0  . nitroGLYCERIN (NITROSTAT) 0.4 MG SL tablet Place 1 tablet (0.4 mg total) under the tongue every 5 (five) minutes x 3 doses as needed for chest pain. (Patient not taking: Reported on 11/04/2019) 90 tablet 0   No current facility-administered medications for this visit.    Allergies  Allergen Reactions  . Statins Other (See Comments)    Muscle weakness    Review of Systems   Weight has been stable Afebrile No symptoms of COVID-19 pulmonary disease  BP 115/64   Pulse 63   Temp 97.6 F (36.4 C) (Skin)   Resp 20   Ht 5\' 2"  (1.575 m)   Wt 174 lb (78.9 kg)   LMP  (LMP Unknown)   SpO2 95% Comment: RA  BMI 31.83 kg/m  Physical Exam      Exam    General- alert and comfortable.  Sternal incision well-healed.    Neck- no JVD, no cervical adenopathy palpable, no carotid bruit   Lungs- clear without rales, wheezes   Cor- regular rate and rhythm, no murmur , gallop   Abdomen- soft, non-tender   Extremities - warm, non-tender, minimal edema   Neuro- oriented, appropriate, no focal weakness   Diagnostic Tests: Chest x-ray today personally reviewed.  Study is clear without pleural effusion or pulmonary infiltrate.  Stable cardiac silhouette  Impression: Patient making excellent progress after multivessel CABG for non-STEMI.  She has had some sudden onset generalized tremors and shaking which may be stress related but we will check  out her chemistries and head CT as discussed above.  Plan: Return after blood work and head CT.  Stop carvedilol for now.   Len Childs, MD [facility] [phone]

## 2019-11-06 ENCOUNTER — Other Ambulatory Visit: Payer: Self-pay

## 2019-11-06 ENCOUNTER — Ambulatory Visit (INDEPENDENT_AMBULATORY_CARE_PROVIDER_SITE_OTHER): Payer: PPO

## 2019-11-06 ENCOUNTER — Encounter: Payer: Self-pay | Admitting: Physician Assistant

## 2019-11-06 ENCOUNTER — Ambulatory Visit (INDEPENDENT_AMBULATORY_CARE_PROVIDER_SITE_OTHER): Payer: PPO | Admitting: Physician Assistant

## 2019-11-06 VITALS — BP 130/62 | HR 60 | Ht 62.0 in | Wt 174.0 lb

## 2019-11-06 DIAGNOSIS — R270 Ataxia, unspecified: Secondary | ICD-10-CM

## 2019-11-06 DIAGNOSIS — I5032 Chronic diastolic (congestive) heart failure: Secondary | ICD-10-CM

## 2019-11-06 DIAGNOSIS — I471 Supraventricular tachycardia: Secondary | ICD-10-CM | POA: Diagnosis not present

## 2019-11-06 DIAGNOSIS — Z951 Presence of aortocoronary bypass graft: Secondary | ICD-10-CM

## 2019-11-06 DIAGNOSIS — R251 Tremor, unspecified: Secondary | ICD-10-CM

## 2019-11-06 DIAGNOSIS — R21 Rash and other nonspecific skin eruption: Secondary | ICD-10-CM | POA: Diagnosis not present

## 2019-11-06 DIAGNOSIS — I2510 Atherosclerotic heart disease of native coronary artery without angina pectoris: Secondary | ICD-10-CM | POA: Diagnosis not present

## 2019-11-06 DIAGNOSIS — E785 Hyperlipidemia, unspecified: Secondary | ICD-10-CM | POA: Diagnosis not present

## 2019-11-06 NOTE — Patient Instructions (Addendum)
Medication Instructions:  - Your physician recommends that you continue on your current medications as directed. Please refer to the Current Medication list given to you today.  *If you need a refill on your cardiac medications before your next appointment, please call your pharmacy*   Lab Work: - none ordered If you have labs (blood work) drawn today and your tests are completely normal, you will receive your results only by: Marland Kitchen MyChart Message (if you have MyChart) OR . A paper copy in the mail If you have any lab test that is abnormal or we need to change your treatment, we will call you to review the results.   Testing/Procedures: - Your physician has recommended that you wear a 14 day Zio AT monitor- placed in office today. This monitor is a medical device that records the heart's electrical activity. Doctors most often use these monitors to diagnose arrhythmias. Arrhythmias are problems with the speed or rhythm of the heartbeat. The monitor is a small device applied to your chest. You can wear one while you do your normal daily activities. While wearing this monitor if you have any symptoms to push the button and record what you felt. Once you have worn this monitor for the period of time provider prescribed (Usually 14 days), you will return the monitor device in the postage paid box. Once it is returned they will download the data collected and provide [place] with a report which the provider will then review and we will call you with those results. Important tips:  1. Avoid showering during the first 24 hours of wearing the monitor. 2. Avoid excessive sweating to help maximize wear time. 3. Do not submerge the device, no hot tubs, and no swimming pools. 4. Keep any lotions or oils away from the patch. 5. After 24 hours you may shower with the patch on. Take brief showers with your back facing the shower head.  6. Do not remove patch once it has been placed because that will interrupt data  and decrease adhesive wear time. 7. Push the button when you have any symptoms and write down what you were feeling. 8. Once you have completed wearing your monitor, remove and place into box which has postage paid and place in your outgoing mailbox.  9. If for some reason you have misplaced your box then call our office and we can provide another box and/or mail it off for you.         Follow-Up: At [facility], you and your health needs are our priority.  As part of our continuing mission to provide you with exceptional heart care, we have created designated Provider Care Teams.  These Care Teams include your primary Cardiologist (physician) and Advanced Practice Providers (APPs -  Physician Assistants and Nurse Practitioners) who all work together to provide you with the care you need, when you need it.  We recommend signing up for the patient portal called "MyChart".  Sign up information is provided on this After Visit Summary.  MyChart is used to connect with patients for Virtual Visits (Telemedicine).  Patients are able to view lab/test results, encounter notes, upcoming appointments, etc.  Non-urgent messages can be sent to your provider as well.   To learn more about what you can do with MyChart, go to [email or web address].    Your next appointment:   2 month(s)  The format for your next appointment:   In Person  Provider:   You may see Ida Rogue, MD or  one of the following Advanced Practice Providers on your designated Care Team:    Murray Hodgkins, NP  Christell Faith, PA-C  Marrianne Mood, PA-C  Cadence Kathlen Mody, [place]    Other Instructions n/a

## 2019-11-07 DIAGNOSIS — I480 Paroxysmal atrial fibrillation: Secondary | ICD-10-CM | POA: Diagnosis not present

## 2019-11-16 DIAGNOSIS — F418 Other specified anxiety disorders: Secondary | ICD-10-CM | POA: Diagnosis not present

## 2019-11-16 DIAGNOSIS — E039 Hypothyroidism, unspecified: Secondary | ICD-10-CM | POA: Diagnosis not present

## 2019-11-16 DIAGNOSIS — E785 Hyperlipidemia, unspecified: Secondary | ICD-10-CM | POA: Diagnosis not present

## 2019-11-17 ENCOUNTER — Ambulatory Visit
Admission: RE | Admit: 2019-11-17 | Discharge: 2019-11-17 | Disposition: A | Discharge status: Home / Self Care | Payer: PPO | Source: Ambulatory Visit | Attending: Cardiothoracic Surgery | Admitting: Cardiothoracic Surgery

## 2019-11-17 DIAGNOSIS — R569 Unspecified convulsions: Secondary | ICD-10-CM | POA: Diagnosis not present

## 2019-11-17 DIAGNOSIS — Z951 Presence of aortocoronary bypass graft: Secondary | ICD-10-CM

## 2019-11-17 MED ORDER — IOPAMIDOL (ISOVUE-300) INJECTION 61%
75.0000 mL | Freq: Once | INTRAVENOUS | Status: AC | PRN
  Administered 2019-11-17: 75 mL via INTRAVENOUS

## 2019-11-18 ENCOUNTER — Other Ambulatory Visit: Payer: Self-pay

## 2019-11-18 ENCOUNTER — Ambulatory Visit (INDEPENDENT_AMBULATORY_CARE_PROVIDER_SITE_OTHER): Payer: PPO | Admitting: Cardiothoracic Surgery

## 2019-11-18 ENCOUNTER — Encounter: Payer: Self-pay | Admitting: Cardiothoracic Surgery

## 2019-11-18 ENCOUNTER — Ambulatory Visit: Payer: PPO | Admitting: Cardiothoracic Surgery

## 2019-11-18 VITALS — BP 125/60 | HR 77 | Temp 97.7°F | Resp 20 | Ht 62.0 in | Wt 174.8 lb

## 2019-11-18 DIAGNOSIS — Z951 Presence of aortocoronary bypass graft: Secondary | ICD-10-CM

## 2019-11-18 NOTE — Progress Notes (Signed)
PCP is Albina Billet, MD Referring Provider is Minna Merritts, MD  Chief Complaint  Patient presents with  . Routine Post Op    s/p CABG x3 09/04/19, f/u of head CT 11/17/19 & bloodwork    HPI: Patient returns in follow-up following head CT scan.  The last time she was in the office for routine visit to follow-up CABG she developed tremor and flapping of the left arm and leg without loss of consciousness and maintain her seat on the exam table.  This had happened twice before.  I checked her electrolytes and her thyroid studies and they were normal.  She had a brain CT with and without contrast and that showed only microvascular disease.  She has had no more episodes.  Cardiology saw her and placed her on a Zio patch to assess for possible arrhythmia as the etiology.  I doubt that this is a cardiac related issue and may only be anxiety related but  will refer the patient to neurology.  Today she feels great and is walking well.  No problems with shortness of breath or chest pain or edema.  Good appetite and sleeping well. Past Medical History:  Diagnosis Date  . Anxiety   . Arthritis   . Cancer (HCC)    UTERINE  . Coronary artery disease   . Depression   . Dyspnea   . Heart murmur   . HOH (hard of hearing)   . Hyperlipidemia   . Hypothyroidism   . Non-STEMI (non-ST elevated myocardial infarction) [facility])     Past Surgical History:  Procedure Laterality Date  . APPENDECTOMY    . CATARACT EXTRACTION W/PHACO Left 09/17/2017   Procedure: CATARACT EXTRACTION PHACO AND INTRAOCULAR LENS PLACEMENT (IOC);  Surgeon: Birder Robson, MD;  Location: [facility];  Service: Ophthalmology;  Laterality: Left;  [place] 00:48 AP% 13.0 CDE 6.26 Fluid pack lot # [id number]  . CATARACT EXTRACTION W/PHACO Right 10/15/2017   Procedure: CATARACT EXTRACTION PHACO AND INTRAOCULAR LENS PLACEMENT (IOC);  Surgeon: Birder Robson, MD;  Location: [facility];  Service: Ophthalmology;  Laterality: Right;  [place]  00:44 AP%  16.1 CDE 7.23 Fluid pack lot # [id number]  . CHOLECYSTECTOMY    . CORONARY ARTERY BYPASS GRAFT N/A 09/04/2019   Procedure: CORONARY ARTERY BYPASS GRAFTING (CABG), ON PUMP, TIMES THREE, LIMA TO LAD, SVG TO PDA, SVG TO OM 2;  Surgeon: Ivin Poot, MD;  Location: [place];  Service: Open Heart Surgery;  Laterality: N/A;  . ENDOVEIN HARVEST OF GREATER SAPHENOUS VEIN Right 09/04/2019   Procedure: ENDOVEIN HARVEST OF GREATER SAPHENOUS VEIN;  Surgeon: Ivin Poot, MD;  Location: [place];  Service: Open Heart Surgery;  Laterality: Right;  . LEFT HEART CATH AND CORONARY ANGIOGRAPHY N/A 08/27/2019   Procedure: LEFT HEART CATH AND CORONARY ANGIOGRAPHY;  Surgeon: Minna Merritts, MD;  Location: [place] [facility];  Service: Cardiovascular;  Laterality: N/A;  . TEE WITHOUT CARDIOVERSION N/A 09/04/2019   Procedure: TRANSESOPHAGEAL ECHOCARDIOGRAM (TEE);  Surgeon: Prescott Gum, Collier Salina, MD;  Location: [place];  Service: Open Heart Surgery;  Laterality: N/A;    Family History  Problem Relation Age of Onset  . Heart attack Father   . Emphysema Mother   . Heart disease Mother     Social History Social History   Tobacco Use  . Smoking status: Never Smoker  . Smokeless tobacco: Never Used  Vaping Use  . Vaping Use: Never used  Substance Use Topics  . Alcohol use: Not  Currently  . Drug use: No    Current Outpatient Medications  Medication Sig Dispense Refill  . acetaminophen (TYLENOL) 500 MG tablet Take 500 mg by mouth every 6 (six) hours as needed for moderate pain or headache.    Marland Kitchen aspirin EC 81 MG tablet Take 1 tablet (81 mg total) by mouth daily. Swallow whole. 90 tablet 3  . escitalopram (LEXAPRO) 10 MG tablet Take 10 mg by mouth daily.     Marland Kitchen ezetimibe (ZETIA) 10 MG tablet Take 1 tablet by mouth once daily 90 tablet 0  . levothyroxine (EUTHYROX) 50 MCG tablet Take 50 mcg by mouth daily before breakfast.    . rosuvastatin (CRESTOR) 10 MG tablet Take 1 tablet (10 mg total) by mouth daily. 90 tablet  3  . lidocaine (LIDODERM) 5 % Place 1 patch onto the skin daily. Remove & Discard patch within 12 hours or as directed by MD Apply to burning painful area in right anterior chest (Patient not taking: Reported on 11/18/2019) 30 patch 0  . nitroGLYCERIN (NITROSTAT) 0.4 MG SL tablet Place 1 tablet (0.4 mg total) under the tongue every 5 (five) minutes x 3 doses as needed for chest pain. 90 tablet 0   No current facility-administered medications for this visit.    Allergies  Allergen Reactions  . Statins Other (See Comments)    Muscle weakness   Review of systems No new complaints since last visit  BP 125/60   Pulse 77   Temp 97.7 F (36.5 C)   Resp 20   Ht 5\' 2"  (1.575 m)   Wt 174 lb 12.8 oz (79.3 kg)   LMP  (LMP Unknown)   SpO2 95%   BMI 31.97 kg/m  Physical Exam      Exam    General- alert and comfortable.  Surgical incisions well-healed.    Neck- no JVD, no cervical adenopathy palpable, no carotid bruit   Lungs- clear without rales, wheezes   Cor- regular rate and rhythm, no murmur , gallop   Abdomen- soft, non-tender   Extremities - warm, non-tender, minimal edema   Neuro- oriented, appropriate, no focal weakness   Diagnostic Tests: CT scan of brain images and report personally reviewed and discussed with patient.  No etiology for the tremor possible seizure disorder.   Impression: Patient referred to neurology.  She will continue her current medications and level of activity.  She does not drive.  I will see her back in 6 to 8 weeks for final postop visit after urgent multivessel CABG for three-vessel coronary disease and non-STEMI   Plan:   Len Childs, MD [facility] [phone]

## 2019-11-19 ENCOUNTER — Encounter: Payer: Self-pay | Admitting: Neurology

## 2019-11-25 DIAGNOSIS — Z23 Encounter for immunization: Secondary | ICD-10-CM | POA: Diagnosis not present

## 2019-11-25 DIAGNOSIS — E785 Hyperlipidemia, unspecified: Secondary | ICD-10-CM | POA: Diagnosis not present

## 2019-11-25 DIAGNOSIS — I259 Chronic ischemic heart disease, unspecified: Secondary | ICD-10-CM | POA: Diagnosis not present

## 2019-11-25 DIAGNOSIS — E038 Other specified hypothyroidism: Secondary | ICD-10-CM | POA: Diagnosis not present

## 2019-12-09 ENCOUNTER — Other Ambulatory Visit: Payer: Self-pay | Admitting: Cardiovascular Disease

## 2019-12-18 ENCOUNTER — Encounter: Payer: Self-pay | Admitting: Neurology

## 2019-12-18 ENCOUNTER — Other Ambulatory Visit: Payer: Self-pay

## 2019-12-18 ENCOUNTER — Ambulatory Visit: Payer: PPO | Admitting: Neurology

## 2019-12-18 VITALS — BP 94/62 | HR 65 | Ht 62.0 in | Wt 171.4 lb

## 2019-12-18 DIAGNOSIS — F419 Anxiety disorder, unspecified: Secondary | ICD-10-CM

## 2019-12-18 DIAGNOSIS — R251 Tremor, unspecified: Secondary | ICD-10-CM | POA: Diagnosis not present

## 2019-12-18 NOTE — Progress Notes (Signed)
NEUROLOGY CONSULTATION NOTE  Bonnie Hancock MRN: [id number] DOB: 1940-11-12  Referring provider: Dr. Tharon Aquas Trigt Primary care provider: Dr. Benita Stabile  Reason for consult:  Seizure post-CABG  Dear Dr Prescott Gum:  Thank you for your kind referral of Bonnie Hancock for consultation of the above symptoms. Although her history is well known to you, please allow me to reiterate it for the purpose of our medical record. The patient was accompanied to the clinic by her husband who also provides collateral information. Records and images were personally reviewed where available.   HISTORY OF PRESENT ILLNESS: This is a pleasant 79 year old right-handed woman with a history of hyperlipidemia, NSTEMI, depression, anxiety, uterine cancer, presenting for seizure-like activity. She reports that symptoms all at once start to come where her hands and legs start shaking without warning. She was sitting in the doctor's office in August and it happened when she stood up. She reported dizziness and loss of feeling in her legs. She did not fall, she was able to sit down, talk, with no loss of consciousness. Episodes were very short lasting a few minutes with no focal weakness. She had 2 in September, one during her doctors visit, notes indicate she had the generalized shaking with weakness, "which could have been a stress reaction." She notes they occur when she is anxious and does not understand what is going on. She feels the episodes are due to stress, they occur when she is put on the spot.Her husband reports an episode 2-3 weeks ago where both hands went up in the air and her legs were shaking, lasting a minute. With the other episodes, she would just get weak with a little shaking. He denies any staring/unresponsive episodes. She has noticed a little problem keeping up with time. She denies any olfactory/gustatory hallucinations, deja vu, rising epigastric sensation, focal numbness/tingling/weakness. She has  occasional headaches with pain in the vertex and back of her head, no nausea/vomiting. They occur mostly with exertion. She takes a Tylenol and headaches resolve. She denies any diplopia but reports her left eye has blurred vision. No dysarthria/dysphagia, bowel/bladder dysfunction. She has neck and shoulder pain. She has tingling in her legs when sitting. She takes Lexapro for anxiety and reports that "it does not take much to upset me." Sleep is good. Her uncle had epilepsy. Otherwise she had a normal birth and early development.  There is no history of febrile convulsions, CNS infections such as meningitis/encephalitis, significant traumatic brain injury, neurosurgical procedures. She had a head CT with and without contrast which I personally reviewed, no acute changes. 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    PAST MEDICAL HISTORY: Past Medical History:  Diagnosis Date  . Anxiety   . Arthritis   . Cancer (HCC)    UTERINE  . Coronary artery disease   . Depression   . Dyspnea   . Heart murmur   . HOH (hard of hearing)   . Hyperlipidemia   . Hypothyroidism   . Non-STEMI (non-ST elevated myocardial infarction) ([place])     PAST SURGICAL HISTORY: Past Surgical History:  Procedure Laterality Date  . APPENDECTOMY    . CATARACT EXTRACTION W/PHACO Left 09/17/2017   Procedure: CATARACT EXTRACTION PHACO AND INTRAOCULAR LENS PLACEMENT (IOC);  Surgeon: Birder Robson, MD;  Location: [facility];  Service: Ophthalmology;  Laterality: Left;  [place] 00:48 AP% 13.0 CDE 6.26 Fluid pack lot # [id number]  . CATARACT EXTRACTION W/PHACO Right 10/15/2017   Procedure: CATARACT EXTRACTION PHACO  AND INTRAOCULAR LENS PLACEMENT (IOC);  Surgeon: Birder Robson, MD;  Location: [facility];  Service: Ophthalmology;  Laterality: Right;  [place]  00:44 AP% 16.1 CDE 7.23 Fluid pack lot # [id number]  . CHOLECYSTECTOMY    . CORONARY ARTERY BYPASS GRAFT N/A 09/04/2019   Procedure: CORONARY ARTERY BYPASS GRAFTING (CABG), ON PUMP,  TIMES THREE, LIMA TO LAD, SVG TO PDA, SVG TO OM 2;  Surgeon: Ivin Poot, MD;  Location: [place];  Service: Open Heart Surgery;  Laterality: N/A;  . ENDOVEIN HARVEST OF GREATER SAPHENOUS VEIN Right 09/04/2019   Procedure: ENDOVEIN HARVEST OF GREATER SAPHENOUS VEIN;  Surgeon: Ivin Poot, MD;  Location: [place];  Service: Open Heart Surgery;  Laterality: Right;  . LEFT HEART CATH AND CORONARY ANGIOGRAPHY N/A 08/27/2019   Procedure: LEFT HEART CATH AND CORONARY ANGIOGRAPHY;  Surgeon: Minna Merritts, MD;  Location: [place] [facility];  Service: Cardiovascular;  Laterality: N/A;  . TEE WITHOUT CARDIOVERSION N/A 09/04/2019   Procedure: TRANSESOPHAGEAL ECHOCARDIOGRAM (TEE);  Surgeon: Prescott Gum, Collier Salina, MD;  Location: [place];  Service: Open Heart Surgery;  Laterality: N/A;    MEDICATIONS: Current Outpatient Medications on File Prior to Visit  Medication Sig Dispense Refill  . acetaminophen (TYLENOL) 500 MG tablet Take 500 mg by mouth every 6 (six) hours as needed for moderate pain or headache.    Marland Kitchen aspirin EC 81 MG tablet Take 1 tablet (81 mg total) by mouth daily. Swallow whole. 90 tablet 3  . escitalopram (LEXAPRO) 10 MG tablet Take 10 mg by mouth daily.     Marland Kitchen ezetimibe (ZETIA) 10 MG tablet Take 1 tablet by mouth once daily 90 tablet 0  . levothyroxine (EUTHYROX) 50 MCG tablet Take 50 mcg by mouth daily before breakfast.    . nitroGLYCERIN (NITROSTAT) 0.4 MG SL tablet Place 1 tablet (0.4 mg total) under the tongue every 5 (five) minutes x 3 doses as needed for chest pain. 90 tablet 0  . rosuvastatin (CRESTOR) 10 MG tablet Take 1 tablet (10 mg total) by mouth daily. 90 tablet 3   No current facility-administered medications on file prior to visit.    ALLERGIES: Allergies  Allergen Reactions  . Statins Other (See Comments)    Muscle weakness    FAMILY HISTORY: Family History  Problem Relation Age of Onset  . Heart attack Father   . Emphysema Mother   . Heart disease Mother      SOCIAL HISTORY: Social History   Socioeconomic History  . Marital status: Married    Spouse name: Not on file  . Number of children: 4  . Years of education: Not on file  . Highest education level: Not on file  Occupational History  . Occupation: retired  Tobacco Use  . Smoking status: Never Smoker  . Smokeless tobacco: Never Used  Vaping Use  . Vaping Use: Never used  Substance and Sexual Activity  . Alcohol use: Not Currently  . Drug use: No  . Sexual activity: Not on file  Other Topics Concern  . Not on file  Social History Narrative   Right handed    Lives with husband   Social Determinants of Health   Financial Resource Strain:   . Difficulty of Paying Living Expenses: Not on file  Food Insecurity:   . Worried About Charity fundraiser in the Last Year: Not on file  . Ran Out of Food in the Last Year: Not on file  Transportation Needs:   .  Lack of Transportation (Medical): Not on file  . Lack of Transportation (Non-Medical): Not on file  Physical Activity:   . Days of Exercise per Week: Not on file  . Minutes of Exercise per Session: Not on file  Stress:   . Feeling of Stress : Not on file  Social Connections:   . Frequency of Communication with Friends and Family: Not on file  . Frequency of Social Gatherings with Friends and Family: Not on file  . Attends Religious Services: Not on file  . Active Member of Clubs or Organizations: Not on file  . Attends Archivist Meetings: Not on file  . Marital Status: Not on file  Intimate Partner Violence:   . Fear of Current or Ex-Partner: Not on file  . Emotionally Abused: Not on file  . Physically Abused: Not on file  . Sexually Abused: Not on file     PHYSICAL EXAM: Vitals:   12/18/19 1237  BP: 94/62  Pulse: 65  SpO2: 95%   General: No acute distress Head:  Normocephalic/atraumatic Skin/Extremities: No rash, no edema Neurological Exam: Mental status: alert and oriented to person, place,  and time, no dysarthria or aphasia, Fund of knowledge is appropriate.  Recent and remote memory are intact.  Attention and concentration are normal.   Cranial nerves: CN I: not tested CN II: pupils equal, round and reactive to light, visual fields intact CN III, IV, VI:  full range of motion, no nystagmus, no ptosis CN V: facial sensation intact CN VII: upper and lower face symmetric CN VIII: hearing intact to conversation Bulk & Tone: normal, no fasciculations. Motor: 5/5 throughout with no pronator drift. Sensation: intact to light touch, cold, pin, vibration and joint position sense.  No extinction to double simultaneous stimulation.  Romberg test negative Deep Tendon Reflexes: +2 throughout. During reflex testing on the right arm, she started having irregular shaking that was distractible.  Cerebellar: no incoordination on finger to nose testing Gait: narrow-based and steady, able to tandem walk adequately. Tremor: none   IMPRESSION: This is an 79 year old right-handed woman with a history of hyperlipidemia, NSTEMI, depression, anxiety, uterine cancer, presenting for seizure-like activity. She had an episode of irregular right arm shaking provoked by reflex testing in the office today, this was distractible. Symptoms discussed with patient, likely stress reaction, which she agrees with. EEG will be ordered for completion. Our office will call with results, if normal, follow-up as needed. Patient encouraged to continue working on stress and anxiety management.   Thank you for allowing me to participate in the care of this patient. Please do not hesitate to call for any questions or concerns.   Ellouise Newer, M.D.  CC: Dr. Prescott Gum, Dr. Hall Busing

## 2019-12-18 NOTE — Patient Instructions (Signed)
Good to meet you! We will schedule an EEG. Our office will call with results, if normal, follow-up as needed. Continue with stress and anxiety management. Take care!

## 2019-12-21 ENCOUNTER — Telehealth: Payer: Self-pay

## 2019-12-21 NOTE — Telephone Encounter (Signed)
The patient has been notified of the result and verbalized understanding.  All questions (if any) were answered. Wilma Flavin, RN 12/21/2019 1:56 PM

## 2019-12-21 NOTE — Telephone Encounter (Signed)
-----   Message from Rise Mu, PA-C sent at 12/21/2019  1:16 PM EDT ----- Heart monitor showed sinus rhythm with an average heart rate of 72 bpm. There were 6 episodes of fast heart beats from the top portion of the heart with the longest episode lasting 17 beats. There was a short episode of heart beats from the bottom portion of the heart around 4 AM, likely while sleeping. Burden of extra heart beats from the top portion is not significant and there were no patient triggered events. No changes needed in medications. No evidence of heart rhythm causing her symptoms.

## 2019-12-23 ENCOUNTER — Ambulatory Visit (INDEPENDENT_AMBULATORY_CARE_PROVIDER_SITE_OTHER): Payer: PPO | Admitting: Neurology

## 2019-12-23 ENCOUNTER — Other Ambulatory Visit: Payer: Self-pay

## 2019-12-23 DIAGNOSIS — F419 Anxiety disorder, unspecified: Secondary | ICD-10-CM

## 2019-12-23 DIAGNOSIS — R251 Tremor, unspecified: Secondary | ICD-10-CM | POA: Diagnosis not present

## 2019-12-25 NOTE — Procedures (Signed)
ELECTROENCEPHALOGRAM REPORT  Date of Study: 12/23/2019  Patient's Name: Bonnie Hancock MRN: [id number] Date of Birth: [birth year]  Referring Provider: Dr. Ellouise Newer  Clinical History: This is an 79 year old woman with episodes of shaking/trembling, weakness.   Medications: TYLENOL 500 MG tablet aspirin EC 81 MG tablet LEXAPRO 10 MG tablet ZETIA 10 MG tablet EUTHYROX 50 MCG tablet NITROSTAT 0.4 MG SL tablet CRESTOR 10 MG tablet   Technical Summary: A multichannel digital EEG recording measured by the international 10-20 system with electrodes applied with paste and impedances below 5000 ohms performed in our laboratory with EKG monitoring in an awake and asleep patient.  Hyperventilation was not performed. Photic stimulation was performed.  The digital EEG was referentially recorded, reformatted, and digitally filtered in a variety of bipolar and referential montages for optimal display.    Description: The patient is awake and asleep during the recording.  During maximal wakefulness, there is a symmetric, medium voltage 9 Hz posterior dominant rhythm that attenuates with eye opening.  The record is symmetric.  During drowsiness and sleep, there is an increase in theta slowing of the background. Vertex waves and symmetric sleep spindles were seen. Photic stimulation did not elicit any abnormalities.  There were no epileptiform discharges or electrographic seizures seen.    EKG lead showed sinus bradycardia at 54 bpm.  Impression: This awake and asleep EEG is normal.    Clinical Correlation: A normal EEG does not exclude a clinical diagnosis of epilepsy.  If further clinical questions remain, prolonged EEG may be helpful.  Clinical correlation is advised.   Ellouise Newer, M.D.

## 2019-12-28 ENCOUNTER — Telehealth: Payer: Self-pay

## 2019-12-28 NOTE — Telephone Encounter (Signed)
-----   Message from Cameron Sprang, MD sent at 12/28/2019 11:43 AM EST ----- Pls let her know the brain wave test was normal, thanks

## 2019-12-28 NOTE — Telephone Encounter (Signed)
Pt called unable to leave a voice mail , will try to call again later

## 2019-12-30 ENCOUNTER — Telehealth: Payer: Self-pay

## 2019-12-30 NOTE — Telephone Encounter (Signed)
-----   Message from Cameron Sprang, MD sent at 12/28/2019 11:43 AM EST ----- Pls let her know the brain wave test was normal, thanks

## 2019-12-30 NOTE — Telephone Encounter (Signed)
Pt called no answer unable to leave a voice mail  

## 2020-01-01 ENCOUNTER — Telehealth: Payer: Self-pay

## 2020-01-01 NOTE — Telephone Encounter (Signed)
-----   Message from Cameron Sprang, MD sent at 12/28/2019 11:43 AM EST ----- Pls let her know the brain wave test was normal, thanks

## 2020-01-01 NOTE — Telephone Encounter (Signed)
Pt called on both numbers no answer unable to leave voice mails

## 2020-01-06 ENCOUNTER — Other Ambulatory Visit: Payer: Self-pay

## 2020-01-06 ENCOUNTER — Ambulatory Visit: Payer: PPO | Admitting: Cardiothoracic Surgery

## 2020-01-06 ENCOUNTER — Encounter: Payer: Self-pay | Admitting: Cardiothoracic Surgery

## 2020-01-06 VITALS — BP 125/75 | HR 76 | Resp 18 | Ht 62.0 in | Wt 172.0 lb

## 2020-01-06 DIAGNOSIS — R413 Other amnesia: Secondary | ICD-10-CM | POA: Diagnosis not present

## 2020-01-06 DIAGNOSIS — Z951 Presence of aortocoronary bypass graft: Secondary | ICD-10-CM

## 2020-01-06 NOTE — Progress Notes (Signed)
PCP is Albina Billet, MD Referring Provider is Minna Merritts, MD  Chief Complaint  Patient presents with  . Routine Post Op    f/u, s/p CABG x3  . Coronary Artery Disease    HPI: Patient returns almost 3 months after multivessel CABG to reassess her neuro status.  At the last office visit she had an episode of violent tremors while maintaining consciousness and conversation.  She has been evaluated by neurology and her head scan and EEG are nonspecific.  She apparently is now being evaluated for dementia.  She states she has problems with short-term memory and confusion.  She has difficulty preparing meals and completing activities.  She has not wandered off or become lost but she is not driving.  No cardiac symptoms.  Surgical incisions are well-healed. Last chest x-ray clear.  Past Medical History:  Diagnosis Date  . Anxiety   . Arthritis   . Cancer (HCC)    UTERINE  . Coronary artery disease   . Depression   . Dyspnea   . Heart murmur   . HOH (hard of hearing)   . Hyperlipidemia   . Hypothyroidism   . Non-STEMI (non-ST elevated myocardial infarction) [facility])     Past Surgical History:  Procedure Laterality Date  . APPENDECTOMY    . CATARACT EXTRACTION W/PHACO Left 09/17/2017   Procedure: CATARACT EXTRACTION PHACO AND INTRAOCULAR LENS PLACEMENT (IOC);  Surgeon: Birder Robson, MD;  Location: [facility];  Service: Ophthalmology;  Laterality: Left;  [place] 00:48 AP% 13.0 CDE 6.26 Fluid pack lot # [id number]  . CATARACT EXTRACTION W/PHACO Right 10/15/2017   Procedure: CATARACT EXTRACTION PHACO AND INTRAOCULAR LENS PLACEMENT (IOC);  Surgeon: Birder Robson, MD;  Location: [facility];  Service: Ophthalmology;  Laterality: Right;  [place]  00:44 AP% 16.1 CDE 7.23 Fluid pack lot # [id number]  . CHOLECYSTECTOMY    . CORONARY ARTERY BYPASS GRAFT N/A 09/04/2019   Procedure: CORONARY ARTERY BYPASS GRAFTING (CABG), ON PUMP, TIMES THREE, LIMA TO LAD, SVG TO PDA, SVG TO OM 2;  Surgeon: Ivin Poot, MD;  Location: [place];  Service: Open Heart Surgery;  Laterality: N/A;  . ENDOVEIN HARVEST OF GREATER SAPHENOUS VEIN Right 09/04/2019   Procedure: ENDOVEIN HARVEST OF GREATER SAPHENOUS VEIN;  Surgeon: Ivin Poot, MD;  Location: [place];  Service: Open Heart Surgery;  Laterality: Right;  . LEFT HEART CATH AND CORONARY ANGIOGRAPHY N/A 08/27/2019   Procedure: LEFT HEART CATH AND CORONARY ANGIOGRAPHY;  Surgeon: Minna Merritts, MD;  Location: [place] [facility];  Service: Cardiovascular;  Laterality: N/A;  . TEE WITHOUT CARDIOVERSION N/A 09/04/2019   Procedure: TRANSESOPHAGEAL ECHOCARDIOGRAM (TEE);  Surgeon: Prescott Gum, Collier Salina, MD;  Location: [place];  Service: Open Heart Surgery;  Laterality: N/A;    Family History  Problem Relation Age of Onset  . Heart attack Father   . Emphysema Mother   . Heart disease Mother     Social History Social History   Tobacco Use  . Smoking status: Never Smoker  . Smokeless tobacco: Never Used  Vaping Use  . Vaping Use: Never used  Substance Use Topics  . Alcohol use: Not Currently  . Drug use: No    Current Outpatient Medications  Medication Sig Dispense Refill  . acetaminophen (TYLENOL) 500 MG tablet Take 500 mg by mouth every 6 (six) hours as needed for moderate pain or headache.    Marland Kitchen aspirin EC 81 MG tablet Take 1 tablet (81 mg total) by mouth  daily. Swallow whole. 90 tablet 3  . escitalopram (LEXAPRO) 10 MG tablet Take 10 mg by mouth daily.     Marland Kitchen ezetimibe (ZETIA) 10 MG tablet Take 1 tablet by mouth once daily 90 tablet 0  . levothyroxine (EUTHYROX) 50 MCG tablet Take 50 mcg by mouth daily before breakfast.    . nitroGLYCERIN (NITROSTAT) 0.4 MG SL tablet Place 1 tablet (0.4 mg total) under the tongue every 5 (five) minutes x 3 doses as needed for chest pain. 90 tablet 0  . rosuvastatin (CRESTOR) 10 MG tablet Take 1 tablet (10 mg total) by mouth daily. 90 tablet 3   No current facility-administered medications for this visit.     Allergies  Allergen Reactions  . Statins Other (See Comments)    Muscle weakness    Review of Systems  Weight stable No fever No cough No headache No abdominal pain  BP 125/75 (BP Location: Left Arm, Patient Position: Sitting)   Pulse 76   Resp 18   Ht 5\' 2"  (1.575 m)   Wt 172 lb (78 kg)   LMP  (LMP Unknown)   SpO2 96% Comment: RA  BMI 31.46 kg/m  Physical Exam      Exam    General- alert and comfortable    Neck- no JVD, no cervical adenopathy palpable, no carotid bruit   Lungs- clear without rales, wheezes   Cor- regular rate and rhythm, no murmur , gallop   Abdomen- soft, non-tender   Extremities - warm, non-tender, minimal edema   Neuro- oriented, appropriate, no focal weakness   Diagnostic Tests: None  Impression: Doing well after multivessel CABG 3 months ago. The importance of heart healthy lifestyle has been emphasized to the patient and her husband including heart healthy diet and 20 to 30 minutes of ambulation daily.  Plan: She will return to care of her primary care physician for assessment of possible dementia and possibly more neurologic evaluation.  She will return here as needed.   Len Childs, MD [facility] [phone]

## 2020-01-18 NOTE — Progress Notes (Deleted)
Patient ID: Bonnie Hancock, female   DOB: Apr 15, 1940, 79 y.o.   MRN: [id number] Cardiology Office Note  Date:  01/18/2020   ID:  Bonnie Hancock, DOB [birth year], MRN [id number]  PCP:  Albina Billet, MD   No chief complaint on file.   HPI:  [name] is a 79 year-old woman,  With history of  shortness of breath with exertion,  Chronic diastolic CHF obesity,  presenting for routine followup  Of her shortness of breath, leg swelling, hyperlipidemia  Overall doing well History of chronic neck pain, shoulder pain Getting worse  No edema on today's visit Taking lasix 20 BID No recent lab work available to review Takes second dose before bed, urinates once a night  Walks in house, No regular exercise program  Some swelling in her legs and fingers this summer, resolved   continues to take zetia  Once a day, no side effects No lipids available for review We did call primary care to confirm her medications  No regular exercise program Denies any palpitations, chest pain, shortness of breath on exertion   EKG personally reviewed by myself on todays visit Shows normal sinus rhythm with rate 63 bpm no significant ST or T-wave changes   Other past medical history reviewed  periodic episodes of weakness, usually associated with exertion, has to sit down and recover on those episodes will feel hot, wonders if her sugar is running low, swoons.    No smoking history.   Prior lab work shows total cholesterol 295, LDL 215,  in 2015  Echocardiogram done in July 2012 shows normal LV systolic function, normal valvular function with no significant disease, mildly elevated right ventricular systolic pressures consistent with mild pulmonary hypertension.   PMH:   has a past medical history of Anxiety, Arthritis, Cancer (Bonnie Hancock), Coronary artery disease, Depression, Dyspnea, Heart murmur, HOH (hard of hearing), Hyperlipidemia, Hypothyroidism, and Non-STEMI (non-ST elevated myocardial  infarction) ([place]).  PSH:    Past Surgical History:  Procedure Laterality Date  . APPENDECTOMY    . CATARACT EXTRACTION W/PHACO Left 09/17/2017   Procedure: CATARACT EXTRACTION PHACO AND INTRAOCULAR LENS PLACEMENT (IOC);  Surgeon: Birder Robson, MD;  Location: [facility];  Service: Ophthalmology;  Laterality: Left;  [place] 00:48 AP% 13.0 CDE 6.26 Fluid pack lot # [id number]  . CATARACT EXTRACTION W/PHACO Right 10/15/2017   Procedure: CATARACT EXTRACTION PHACO AND INTRAOCULAR LENS PLACEMENT (IOC);  Surgeon: Birder Robson, MD;  Location: [facility];  Service: Ophthalmology;  Laterality: Right;  [place]  00:44 AP% 16.1 CDE 7.23 Fluid pack lot # [id number]  . CHOLECYSTECTOMY    . CORONARY ARTERY BYPASS GRAFT N/A 09/04/2019   Procedure: CORONARY ARTERY BYPASS GRAFTING (CABG), ON PUMP, TIMES THREE, LIMA TO LAD, SVG TO PDA, SVG TO OM 2;  Surgeon: Ivin Poot, MD;  Location: [place];  Service: Open Heart Surgery;  Laterality: N/A;  . ENDOVEIN HARVEST OF GREATER SAPHENOUS VEIN Right 09/04/2019   Procedure: ENDOVEIN HARVEST OF GREATER SAPHENOUS VEIN;  Surgeon: Ivin Poot, MD;  Location: [place];  Service: Open Heart Surgery;  Laterality: Right;  . LEFT HEART CATH AND CORONARY ANGIOGRAPHY N/A 08/27/2019   Procedure: LEFT HEART CATH AND CORONARY ANGIOGRAPHY;  Surgeon: Minna Merritts, MD;  Location: [place] [facility];  Service: Cardiovascular;  Laterality: N/A;  . TEE WITHOUT CARDIOVERSION N/A 09/04/2019   Procedure: TRANSESOPHAGEAL ECHOCARDIOGRAM (TEE);  Surgeon: Prescott Gum, Collier Salina, MD;  Location: [place];  Service: Open Heart Surgery;  Laterality: N/A;  Current Outpatient Medications  Medication Sig Dispense Refill  . acetaminophen (TYLENOL) 500 MG tablet Take 500 mg by mouth every 6 (six) hours as needed for moderate pain or headache.    Marland Kitchen aspirin EC 81 MG tablet Take 1 tablet (81 mg total) by mouth daily. Swallow whole. 90 tablet 3  . escitalopram (LEXAPRO) 10 MG tablet Take 10 mg by mouth daily.      Marland Kitchen ezetimibe (ZETIA) 10 MG tablet Take 1 tablet by mouth once daily 90 tablet 0  . levothyroxine (EUTHYROX) 50 MCG tablet Take 50 mcg by mouth daily before breakfast.    . nitroGLYCERIN (NITROSTAT) 0.4 MG SL tablet Place 1 tablet (0.4 mg total) under the tongue every 5 (five) minutes x 3 doses as needed for chest pain. 90 tablet 0  . rosuvastatin (CRESTOR) 10 MG tablet Take 1 tablet (10 mg total) by mouth daily. 90 tablet 3   No current facility-administered medications for this visit.     Allergies:   Statins   Social History:  The patient  reports that she has never smoked. She has never used smokeless tobacco. She reports previous alcohol use. She reports that she does not use drugs.   Family History:   family history includes Emphysema in her mother; Heart attack in her father; Heart disease in her mother.    Review of Systems: Review of Systems  Constitutional: Negative.   HENT: Negative.   Respiratory: Negative.   Cardiovascular: Negative.   Gastrointestinal: Negative.   Musculoskeletal: Positive for joint pain and neck pain.  Psychiatric/Behavioral: Negative.   All other systems reviewed and are negative.   PHYSICAL EXAM: VS:  LMP  (LMP Unknown)  , BMI There is no height or weight on file to calculate BMI.  Constitutional:  oriented to person, place, and time. No distress.  HENT:  Head: Grossly normal Eyes:  no discharge. No scleral icterus.  Neck: No JVD, no carotid bruits  Cardiovascular: Regular rate and rhythm, no murmurs appreciated Pulmonary/Chest: Clear to auscultation bilaterally, no wheezes or rails Abdominal: Soft.  no distension.  no tenderness.  Musculoskeletal: Normal range of motion Neurological:  normal muscle tone. Coordination normal. No atrophy Skin: Skin warm and dry Psychiatric: normal affect, pleasant   Recent Labs: 06/22/2019: B Natriuretic Peptide 76.0 09/05/2019: Magnesium 2.2 10/09/2019: Hemoglobin 11.1; Platelets 281 11/04/2019: ALT 10;  BUN 16; Creat 0.77; Potassium 4.0; Sodium 141; TSH 0.95   results have been requested   Lipid Panel Lab Results  Component Value Date   CHOL 178 08/27/2019   HDL 35 (L) 08/27/2019   LDLCALC 102 (H) 08/27/2019   TRIG 206 (H) 08/27/2019    Wt Readings from Last 3 Encounters:  01/06/20 172 lb (78 kg)  12/18/19 171 lb 6.4 oz (77.7 kg)  11/18/19 174 lb 12.8 oz (79.3 kg)     ASSESSMENT AND PLAN:   Hyperlipidemia - Plan: EKG 12-Lead Unable to tolerate a statin in the past,  continue on zetia Lipid panel drawn today  SOB (shortness of breath) - Stressed importance of walking program for conditioning Symptoms stable  Chronic diastolic CHF Lasix 20 BID, BMP today , may be up to back down to once a day  Arthritis Neck, shoulders prior cortisone shot  Disposition:   F/U  12 months   Total encounter time more than 25 minutes  Greater than 50% was spent in counseling and coordination of care with the patient    No orders of the defined types were placed  in this encounter.    Signed, Esmond Plants, M.D., Ph.D. 01/18/2020  [place], [place]

## 2020-01-19 ENCOUNTER — Ambulatory Visit: Payer: PPO | Admitting: Cardiovascular Disease

## 2020-01-20 ENCOUNTER — Encounter: Payer: Self-pay | Admitting: Cardiovascular Disease

## 2020-03-16 ENCOUNTER — Other Ambulatory Visit: Payer: Self-pay | Admitting: Cardiovascular Disease

## 2020-04-01 DIAGNOSIS — E785 Hyperlipidemia, unspecified: Secondary | ICD-10-CM | POA: Diagnosis not present

## 2020-04-01 DIAGNOSIS — E039 Hypothyroidism, unspecified: Secondary | ICD-10-CM | POA: Diagnosis not present

## 2020-04-26 DIAGNOSIS — I259 Chronic ischemic heart disease, unspecified: Secondary | ICD-10-CM | POA: Diagnosis not present

## 2020-04-26 DIAGNOSIS — F0390 Unspecified dementia without behavioral disturbance: Secondary | ICD-10-CM | POA: Diagnosis not present

## 2020-04-26 DIAGNOSIS — E785 Hyperlipidemia, unspecified: Secondary | ICD-10-CM | POA: Diagnosis not present

## 2020-04-27 ENCOUNTER — Ambulatory Visit: Payer: PPO | Admitting: Cardiovascular Disease

## 2020-05-10 ENCOUNTER — Ambulatory Visit: Payer: PPO | Admitting: Cardiovascular Disease

## 2020-05-10 NOTE — Progress Notes (Signed)
Patient ID: Bonnie Hancock, female   DOB: 1940/09/20, 80 y.o.   MRN: [id number] Cardiology Office Note  Date:  05/11/2020   ID:  BREEANNA Bonnie Hancock, DOB [birth year], MRN [id number]  PCP:  Albina Billet, MD   Chief Complaint  Patient presents with  . 12 month follow up     Patient c/o shortness of breath with little exertion. Medications reviewed by the patient verbally.     HPI:  Ms. Bonnie Hancock is a 80 year-old woman,  With history of  Chronic diastolic CHF obesity,  Chronic shortness of breath with exertion Non-STEMI July 2021 troponin 8900 Who presents for routine follow-up of her coronary disease, S/P CABG, chronic shortness of breath  Last seen in clinic by myself March 2021 Has seen one of our providers last in September 2021 Rash on Plavix, this was held,  Presenting to the hospital with non-STEMI July 2021,  cardiac catheterization July 2021 Three-vessel coronary disease There is distal left main, ostial LAD, mid left circumflex, mid RCA disease, ostial/proximal OM disease Mildly depressed ejection fraction 45 to 50% with inferior wall hypokinesis  Underwent CABG September 04, 2019 CORONARY ARTERY BYPASS GRAFTING (CABG), ON PUMP, TIMES THREE, LIMA TO LAD, SVG TO PDA, SVG TO OM 2;   Notes reviewed from cardiothoracic surgery visit November 2021   episode of violent tremors while maintaining consciousness and conversation.  She has been evaluated by neurology and her head scan and EEG are nonspecific.  She apparently is now being evaluated for dementia.  She states she has problems with short-term memory and confusion.  She has difficulty preparing meals and completing activities.    Still with rare episodes May be related to "low sugar?".  1 episode seem to get better after eating, she had missed meal that morning -Sedentary at baseline Thinking about restarting her water aerobics  EKG personally reviewed by myself on todays visit NSR rate 60 bpm no ST or T wave changes  Other  past medical history reviewed  periodic episodes of weakness, usually associated with exertion, has to sit down and recover on those episodes will feel hot, wonders if her sugar is running low, swoons.    No smoking history.   Prior lab work shows total cholesterol 295, LDL 215,  in 2015  Echocardiogram done in July 2012 shows normal LV systolic function, normal valvular function with no significant disease, mildly elevated right ventricular systolic pressures consistent with mild pulmonary hypertension.   PMH:   has a past medical history of Anxiety, Arthritis, Cancer ([place]), Coronary artery disease, Depression, Dyspnea, Heart murmur, HOH (hard of hearing), Hyperlipidemia, Hypothyroidism, and Non-STEMI (non-ST elevated myocardial infarction) ([place]).  PSH:    Past Surgical History:  Procedure Laterality Date  . APPENDECTOMY    . CATARACT EXTRACTION W/PHACO Left 09/17/2017   Procedure: CATARACT EXTRACTION PHACO AND INTRAOCULAR LENS PLACEMENT (IOC);  Surgeon: Birder Robson, MD;  Location: [facility];  Service: Ophthalmology;  Laterality: Left;  [place] 00:48 AP% 13.0 CDE 6.26 Fluid pack lot # [id number]  . CATARACT EXTRACTION W/PHACO Right 10/15/2017   Procedure: CATARACT EXTRACTION PHACO AND INTRAOCULAR LENS PLACEMENT (IOC);  Surgeon: Birder Robson, MD;  Location: [facility];  Service: Ophthalmology;  Laterality: Right;  [place]  00:44 AP% 16.1 CDE 7.23 Fluid pack lot # [id number]  . CHOLECYSTECTOMY    . CORONARY ARTERY BYPASS GRAFT N/A 09/04/2019   Procedure: CORONARY ARTERY BYPASS GRAFTING (CABG), ON PUMP, TIMES THREE, LIMA TO LAD, SVG TO PDA, SVG TO OM 2;  Surgeon: Prescott Gum, Collier Salina, MD;  Location: [place];  Service: Open Heart Surgery;  Laterality: N/A;  . ENDOVEIN HARVEST OF GREATER SAPHENOUS VEIN Right 09/04/2019   Procedure: ENDOVEIN HARVEST OF GREATER SAPHENOUS VEIN;  Surgeon: Ivin Poot, MD;  Location: [place];  Service: Open Heart Surgery;  Laterality: Right;  . LEFT HEART CATH AND CORONARY  ANGIOGRAPHY N/A 08/27/2019   Procedure: LEFT HEART CATH AND CORONARY ANGIOGRAPHY;  Surgeon: Minna Merritts, MD;  Location: [place] [facility];  Service: Cardiovascular;  Laterality: N/A;  . TEE WITHOUT CARDIOVERSION N/A 09/04/2019   Procedure: TRANSESOPHAGEAL ECHOCARDIOGRAM (TEE);  Surgeon: Prescott Gum, Collier Salina, MD;  Location: [place];  Service: Open Heart Surgery;  Laterality: N/A;    Current Outpatient Medications  Medication Sig Dispense Refill  . acetaminophen (TYLENOL) 500 MG tablet Take 500 mg by mouth every 6 (six) hours as needed for moderate pain or headache.    Marland Kitchen aspirin EC 81 MG tablet Take 1 tablet (81 mg total) by mouth daily. Swallow whole. 90 tablet 3  . escitalopram (LEXAPRO) 10 MG tablet Take 10 mg by mouth daily.     Marland Kitchen ezetimibe (ZETIA) 10 MG tablet Take 1 tablet by mouth once daily 90 tablet 0  . levothyroxine (SYNTHROID) 50 MCG tablet Take 50 mcg by mouth daily before breakfast.    . nitroGLYCERIN (NITROSTAT) 0.4 MG SL tablet Place 1 tablet (0.4 mg total) under the tongue every 5 (five) minutes x 3 doses as needed for chest pain. 90 tablet 0  . rosuvastatin (CRESTOR) 10 MG tablet Take 1 tablet (10 mg total) by mouth daily. 90 tablet 3   No current facility-administered medications for this visit.     Allergies:   Statins   Social History:  The patient  reports that she has never smoked. She has never used smokeless tobacco. She reports previous alcohol use. She reports that she does not use drugs.   Family History:   family history includes Emphysema in her mother; Heart attack in her father; Heart disease in her mother.    Review of Systems: Review of Systems  Constitutional: Negative.   HENT: Negative.   Respiratory: Negative.   Cardiovascular: Negative.   Gastrointestinal: Negative.   Musculoskeletal: Positive for joint pain and neck pain.  Psychiatric/Behavioral: Negative.   All other systems reviewed and are negative.   PHYSICAL EXAM: VS:  BP (!) 114/58  (BP Location: Left Arm, Patient Position: Sitting, Cuff Size: Normal)   Pulse 60   Ht 5\' 2"  (1.575 m)   Wt 176 lb 8 oz (80.1 kg)   LMP  (LMP Unknown)   SpO2 98%   BMI 32.28 kg/m  , BMI Body mass index is 32.28 kg/m.  Constitutional:  oriented to person, place, and time. No distress.  HENT:  Head: Grossly normal Eyes:  no discharge. No scleral icterus.  Neck: No JVD, no carotid bruits  Cardiovascular: Regular rate and rhythm, no murmurs appreciated Pulmonary/Chest: Clear to auscultation bilaterally, no wheezes or rails Abdominal: Soft.  no distension.  no tenderness.  Musculoskeletal: Normal range of motion Neurological:  normal muscle tone. Coordination normal. No atrophy Skin: Skin warm and dry Psychiatric: normal affect, pleasant   Recent Labs: 06/22/2019: B Natriuretic Peptide 76.0 09/05/2019: Magnesium 2.2 10/09/2019: Hemoglobin 11.1; Platelets 281 11/04/2019: ALT 10; BUN 16; Creat 0.77; Potassium 4.0; Sodium 141; TSH 0.95   results have been requested   Lipid Panel Lab Results  Component Value Date   CHOL 178 08/27/2019  HDL 35 (L) 08/27/2019   LDLCALC 102 (H) 08/27/2019   TRIG 206 (H) 08/27/2019    Wt Readings from Last 3 Encounters:  05/11/20 176 lb 8 oz (80.1 kg)  01/06/20 172 lb (78 kg)  12/18/19 171 lb 6.4 oz (77.7 kg)     ASSESSMENT AND PLAN:   Hyperlipidemia - Plan: EKG 12-Lead On crestor and zetia Labs requested from Dr. Hall Busing  SOB (shortness of breath) - Recommend she restart her walking program  Chronic diastolic CHF Euvolemic, off lasix Recommend she take her Lasix for ankle swelling  CAD, s/p CABG Currently with no symptoms of angina. No further workup at this time. Continue current medication regimen.  Arthritis Neck, shoulders prior cortisone shot Will start start exercise, water aerobics  Tremor Etiology of spells unclear, recommend she not miss any meals If she continues to have episodes recommend she may need to follow-up with  neurology Family will monitor for now    Total encounter time more than 25 minutes  Greater than 50% was spent in counseling and coordination of care with the patient    Orders Placed This Encounter  Procedures  . EKG 12-Lead     Signed, Esmond Plants, M.D., Ph.D. 05/11/2020  [place], [place]

## 2020-05-11 ENCOUNTER — Encounter: Payer: Self-pay | Admitting: Cardiovascular Disease

## 2020-05-11 ENCOUNTER — Other Ambulatory Visit: Payer: Self-pay

## 2020-05-11 ENCOUNTER — Ambulatory Visit: Payer: PPO | Admitting: Cardiovascular Disease

## 2020-05-11 ENCOUNTER — Telehealth: Payer: Self-pay | Admitting: Cardiovascular Disease

## 2020-05-11 VITALS — BP 114/58 | HR 60 | Ht 62.0 in | Wt 176.5 lb

## 2020-05-11 DIAGNOSIS — R0602 Shortness of breath: Secondary | ICD-10-CM

## 2020-05-11 DIAGNOSIS — I2510 Atherosclerotic heart disease of native coronary artery without angina pectoris: Secondary | ICD-10-CM | POA: Diagnosis not present

## 2020-05-11 DIAGNOSIS — E785 Hyperlipidemia, unspecified: Secondary | ICD-10-CM

## 2020-05-11 DIAGNOSIS — Z951 Presence of aortocoronary bypass graft: Secondary | ICD-10-CM | POA: Diagnosis not present

## 2020-05-11 DIAGNOSIS — I5032 Chronic diastolic (congestive) heart failure: Secondary | ICD-10-CM

## 2020-05-11 MED ORDER — EZETIMIBE 10 MG PO TABS: 10.0000 mg | ORAL_TABLET | Freq: Every day | ORAL | 1 refills | Status: DC

## 2020-05-11 NOTE — Patient Instructions (Signed)
Medication Instructions:  No changes  If you need a refill on your cardiac medications before your next appointment, please call your pharmacy.    Lab work: No new labs needed   If you have labs (blood work) drawn today and your tests are completely normal, you will receive your results only by: . MyChart Message (if you have MyChart) OR . A paper copy in the mail If you have any lab test that is abnormal or we need to change your treatment, we will call you to review the results.   Testing/Procedures: No new testing needed   Follow-Up: At [facility], you and your health needs are our priority.  As part of our continuing mission to provide you with exceptional heart care, we have created designated Provider Care Teams.  These Care Teams include your primary Cardiologist (physician) and Advanced Practice Providers (APPs -  Physician Assistants and Nurse Practitioners) who all work together to provide you with the care you need, when you need it.  . You will need a follow up appointment in 6 months  . Providers on your designated Care Team:   . Christopher Berge, NP . Ryan Dunn, PA-C . Jacquelyn Visser, PA-C  Any Other Special Instructions Will Be Listed Below (If Applicable).  COVID-19 Vaccine Information can be found at: [email or web address] For questions related to vaccine distribution or appointments, please email [email or web address] or call [phone].     

## 2020-05-11 NOTE — Telephone Encounter (Signed)
Requested Prescriptions   Signed Prescriptions Disp Refills   ezetimibe (ZETIA) 10 MG tablet 90 tablet 1    Sig: Take 1 tablet (10 mg total) by mouth daily.    Authorizing Provider: Minna Merritts    Ordering User: Raelene Bott, Zebastian Carico L

## 2020-05-11 NOTE — Telephone Encounter (Signed)
*  STAT* If patient is at the pharmacy, call can be transferred to refill team.   1. Which medications need to be refilled? (please list name of each medication and dose if known) Ezetimibe 10 mg po q d   2. Which pharmacy/location (including street and city if local pharmacy) is medication to be sent to? [place]   3. Do they need a 30 day or 90 day supply? [place]

## 2020-07-05 ENCOUNTER — Telehealth: Payer: Self-pay | Admitting: *Deleted

## 2020-07-05 NOTE — Telephone Encounter (Signed)
Pt made aware of referral to [facility] from ED provider. Pt is in agreement to have upcoming CT scan and follow up as recommended. Pt has been made aware of upcoming appts for follow up CT scan and follow up appt with Faythe Casa, NP in the [facility]. Pt verbalized understanding. Nothing further needed at this time.

## 2020-07-14 ENCOUNTER — Telehealth: Payer: Self-pay | Admitting: *Deleted

## 2020-07-14 NOTE — Telephone Encounter (Signed)
Pt left message to confirm appts in the lung nodule clinic for CT scan and follow up. Attempted to contact pt on home and mobile with no answer and unable to leave message.

## 2020-07-15 ENCOUNTER — Other Ambulatory Visit: Payer: Self-pay

## 2020-07-15 ENCOUNTER — Ambulatory Visit
Admission: RE | Admit: 2020-07-15 | Discharge: 2020-07-15 | Disposition: A | Discharge status: Home / Self Care | Payer: PPO | Source: Ambulatory Visit | Attending: Oncology | Admitting: Oncology

## 2020-07-15 DIAGNOSIS — R911 Solitary pulmonary nodule: Secondary | ICD-10-CM | POA: Insufficient documentation

## 2020-07-15 DIAGNOSIS — J984 Other disorders of lung: Secondary | ICD-10-CM | POA: Diagnosis not present

## 2020-07-15 DIAGNOSIS — J479 Bronchiectasis, uncomplicated: Secondary | ICD-10-CM | POA: Diagnosis not present

## 2020-07-15 DIAGNOSIS — I2510 Atherosclerotic heart disease of native coronary artery without angina pectoris: Secondary | ICD-10-CM | POA: Diagnosis not present

## 2020-07-15 DIAGNOSIS — I700 Atherosclerosis of aorta: Secondary | ICD-10-CM | POA: Diagnosis not present

## 2020-07-19 ENCOUNTER — Inpatient Hospital Stay: Payer: PPO | Attending: Oncology | Admitting: Oncology

## 2020-07-19 VITALS — BP 119/90 | HR 66 | Temp 97.9°F | Resp 18 | Wt 177.8 lb

## 2020-07-19 DIAGNOSIS — R911 Solitary pulmonary nodule: Secondary | ICD-10-CM | POA: Diagnosis not present

## 2020-07-19 DIAGNOSIS — R918 Other nonspecific abnormal finding of lung field: Secondary | ICD-10-CM | POA: Insufficient documentation

## 2020-08-23 NOTE — Progress Notes (Addendum)
Pulmonary Nodule Clinic Consult note [facility]  [phone] [phone]  Patient Care Team: Albina Billet, MD as PCP - General (Unknown Physician Specialty) Minna Merritts, MD as PCP - Cardiology (Cardiology) Minna Merritts, MD as Consulting Physician (Cardiology) Cameron Sprang, MD as Consulting Physician (Neurology)   Name of the patient: Bonnie Hancock  [id number]  [birth year]   Date of visit: 07/19/2020   Diagnosis- Lung Nodule  Chief complaint/ Reason for visit- Pulmonary Nodule Clinic Initial Visit  Past Medical History:  Bonnie Hancock is an 80 year old female with past medical history significant for HLD, diastolic heart failure, obesity who presented to the emergency room on 06/22/2019 for chest pain and fatigue. Work-up included lab work which revealed an elevated D-dimer. CT angio to rule out PE was negative for PE but found to new pulmonary nodules measuring 3 mm and 4 mm in size. Referral sent to pulmonary nodule clinic.  I personally reviewed her CT angio from 06/23/2019 which showed a 4 mm left upper lobe pulmonary nodule and a 3 mm left lower lobe pulmonary nodule. A 22-month noncontrast chest CT is recommended in 12 months if patient is considered high risk.  Interval history-patient reports today to lung nodule clinic to discuss recent CT scan.  She has never smoked and has never been exposed to secondhand smoke.  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   Denies personal or family history of cancer.  She is currently retired and lives at home alone.  Reports some shortness of breath with exertion while completing housework but otherwise she is doing well.  Denies any acute concerns.  ECOG FS:1 - Symptomatic but completely ambulatory  Review of systems- Review of Systems  Constitutional: Negative.  Negative for chills, fever, malaise/fatigue and weight loss.  HENT:   Negative for congestion, ear pain and tinnitus.   Eyes: Negative.  Negative for blurred vision and double vision.  Respiratory:  Positive for shortness of breath. Negative for cough and sputum production.   Cardiovascular: Negative.  Negative for chest pain, palpitations and leg swelling.  Gastrointestinal: Negative.  Negative for abdominal pain, constipation, diarrhea, nausea and vomiting.  Genitourinary:  Negative for dysuria, frequency and urgency.  Musculoskeletal:  Negative for back pain and falls.  Skin: Negative.  Negative for rash.  Neurological: Negative.  Negative for weakness and headaches.  Endo/Heme/Allergies: Negative.  Does not bruise/bleed easily.  Psychiatric/Behavioral: Negative.  Negative for depression. The patient is not nervous/anxious and does not have insomnia.     Allergies  Allergen Reactions   Statins Other (See Comments)    Muscle weakness     Past Medical History:  Diagnosis Date   Anxiety    Arthritis    Cancer ([place])    UTERINE   Coronary artery disease    Depression    Dyspnea    Heart murmur    HOH (hard of hearing)    Hyperlipidemia    Hypothyroidism    Non-STEMI (non-ST elevated myocardial infarction) [facility])      Past Surgical History:  Procedure Laterality Date   APPENDECTOMY     CATARACT EXTRACTION W/PHACO Left 09/17/2017   Procedure: CATARACT EXTRACTION PHACO AND INTRAOCULAR LENS PLACEMENT ([place]);  Surgeon: Birder Robson, MD;  Location: [facility];  Service: Ophthalmology;  Laterality: Left;  [place] 00:48 AP% 13.0 CDE 6.26 Fluid pack lot # [id number]   CATARACT EXTRACTION W/PHACO Right 10/15/2017   Procedure: CATARACT EXTRACTION  PHACO AND INTRAOCULAR LENS PLACEMENT ([facility]);  Surgeon: Birder Robson, MD;  Location: [facility];  Service: Ophthalmology;  Laterality: Right;  [place]  00:44 AP% 16.1 CDE 7.23 Fluid pack lot # [id number]   CHOLECYSTECTOMY     CORONARY ARTERY BYPASS GRAFT N/A 09/04/2019   Procedure: CORONARY ARTERY BYPASS GRAFTING  (CABG), ON PUMP, TIMES THREE, LIMA TO LAD, SVG TO PDA, SVG TO OM 2;  Surgeon: Ivin Poot, MD;  Location: [place];  Service: Open Heart Surgery;  Laterality: N/A;   ENDOVEIN HARVEST OF GREATER SAPHENOUS VEIN Right 09/04/2019   Procedure: ENDOVEIN HARVEST OF GREATER SAPHENOUS VEIN;  Surgeon: Ivin Poot, MD;  Location: [place];  Service: Open Heart Surgery;  Laterality: Right;   LEFT HEART CATH AND CORONARY ANGIOGRAPHY N/A 08/27/2019   Procedure: LEFT HEART CATH AND CORONARY ANGIOGRAPHY;  Surgeon: Minna Merritts, MD;  Location: [place] [facility];  Service: Cardiovascular;  Laterality: N/A;   TEE WITHOUT CARDIOVERSION N/A 09/04/2019   Procedure: TRANSESOPHAGEAL ECHOCARDIOGRAM (TEE);  Surgeon: Prescott Gum, Collier Salina, MD;  Location: [place];  Service: Open Heart Surgery;  Laterality: N/A;    Social History   Socioeconomic History   Marital status: Married    Spouse name: Not on file   Number of children: 4   Years of education: Not on file   Highest education level: Not on file  Occupational History   Occupation: retired  Tobacco Use   Smoking status: Never   Smokeless tobacco: Never  Vaping Use   Vaping Use: Never used  Substance and Sexual Activity   Alcohol use: Not Currently   Drug use: No   Sexual activity: Not on file  Other Topics Concern   Not on file  Social History Narrative   Right handed    Lives with husband   Social Determinants of Health   Financial Resource Strain: Not on file  Food Insecurity: Not on file  Transportation Needs: Not on file  Physical Activity: Not on file  Stress: Not on file  Social Connections: Not on file  Intimate Partner Violence: Not on file    Family History  Problem Relation Age of Onset   Heart attack Father    Emphysema Mother    Heart disease Mother      Current Outpatient Medications:    aspirin EC 81 MG tablet, Take 1 tablet (81 mg total) by mouth daily. Swallow whole., Disp: 90 tablet, Rfl: 3   escitalopram (LEXAPRO) 10  MG tablet, Take 10 mg by mouth daily. , Disp: , Rfl:    ezetimibe (ZETIA) 10 MG tablet, Take 1 tablet (10 mg total) by mouth daily., Disp: 90 tablet, Rfl: 1   levothyroxine (SYNTHROID) 50 MCG tablet, Take 50 mcg by mouth daily before breakfast., Disp: , Rfl:    nitroGLYCERIN (NITROSTAT) 0.4 MG SL tablet, Place 1 tablet (0.4 mg total) under the tongue every 5 (five) minutes x 3 doses as needed for chest pain., Disp: 90 tablet, Rfl: 0   rosuvastatin (CRESTOR) 10 MG tablet, Take 1 tablet (10 mg total) by mouth daily., Disp: 90 tablet, Rfl: 3   acetaminophen (TYLENOL) 500 MG tablet, Take 500 mg by mouth every 6 (six) hours as needed for moderate pain or headache., Disp: , Rfl:   Physical exam:  Vitals:   07/19/20 1337  BP: 119/90  Pulse: 66  Resp: 18  Temp: 97.9 F (36.6 C)  TempSrc: Tympanic  SpO2: 96%  Weight: 177 lb 12.8 oz (80.6 kg)  Physical Exam Constitutional:      Appearance: Normal appearance.  HENT:     Head: Normocephalic and atraumatic.  Eyes:     Pupils: Pupils are equal, round, and reactive to light.  Cardiovascular:     Rate and Rhythm: Normal rate and regular rhythm.     Heart sounds: Normal heart sounds. No murmur heard. Pulmonary:     Effort: Pulmonary effort is normal.     Breath sounds: Normal breath sounds. No wheezing.  Abdominal:     General: Bowel sounds are normal. There is no distension.     Palpations: Abdomen is soft.     Tenderness: There is no abdominal tenderness.  Musculoskeletal:        General: Normal range of motion.     Cervical back: Normal range of motion.  Skin:    General: Skin is warm and dry.     Findings: No rash.  Neurological:     Mental Status: She is alert and oriented to person, place, and time.  Psychiatric:        Judgment: Judgment normal.     CMP Latest Ref Rng & Units 11/04/2019  Glucose 65 - 139 mg/dL 86  BUN 7 - 25 mg/dL 16  Creatinine 0.60 - 0.88 mg/dL 0.77  Sodium 135 - 146 mmol/L 141  Potassium 3.5 - 5.3 mmol/L  4.0  Chloride 98 - 110 mmol/L 109  CO2 20 - 32 mmol/L 26  Calcium 8.6 - 10.4 mg/dL 9.1  Total Protein 6.1 - 8.1 g/dL 6.5  Total Bilirubin 0.2 - 1.2 mg/dL 0.6  AST 10 - 35 U/L 14  ALT 6 - 29 U/L 10   CBC Latest Ref Rng & Units 10/09/2019  WBC 3.4 - 10.8 x10E3/uL 6.5  Hemoglobin 11.1 - 15.9 g/dL 11.1  Hematocrit 34.0 - 46.6 % 34.4  Platelets 150 - 450 x10E3/uL 281    No images are attached to the encounter.  No results found.   Assessment and plan- Patient is a 80 y.o. female who presents to pulmonary nodule clinic for follow-up of incidental lung nodules.     CT chest without contrast from 07/15/2020 shows a stable 2 to 4 mm pulmonary nodules considered definitively benign.  Mild postinfectious or inflammatory scarring in right lower lobe which appears similar to prior study.  Given she is never smoked she is at low risk for the development of lung cancer.   Calculating malignancy probability of a pulmonary nodule: Risk factors include: 1.  Age. 2.  Cancer history. 3.  Diameter of pulmonary nodule and mm 4.  Location 5.  Smoking history 6.  Spiculation present   Based on risk factors, this patient is low risk for the development of lung cancer.  No additional follow-up is needed.    During our visit, we discussed pulmonary nodules are a common incidental finding and are often how lung cancer is discovered.  Lung cancer survival is directly related to the stage at diagnosis.  We discussed that nodules can vary in presentation from solitary pulmonary nodules to masses, 2 groundglass opacities and multiple nodules.  Pulmonary nodules in the majority of cases are benign but the probability of these becoming malignant cannot be undermined.  Early identification of malignant nodules could lead to early diagnosis and increased survival.   We discussed the probability of pulmonary nodules becoming malignant increase with age, pack years of tobacco use, size/characteristics of the nodule  and location; with upper lobe involvement being most worrisome.  We discussed the goal of our clinic is to thoroughly evaluate each nodule, developed a comprehensive, individualized plan of care utilizing the most advanced technology and significantly reduce the time from detection to treatment.  A dedicated pulmonary nodule clinic has proven to indeed expedite the detection and treatment of lung cancer.   Patient education in fact sheet provided along with most recent CT scans.  Assessment: Reviewed past medical history and family history. Reviewed occupational exposure Reviewed social history. Reviewed most recent CT scan.  Plan: No additional follow-up is needed.   Visit Diagnosis 1. Lung nodule     Patient expressed understanding and was in agreement with this plan. She also understands that She can call clinic at any time with any questions, concerns, or complaints.   Greater than 50% was spent in counseling and coordination of care with this patient including but not limited to discussion of the relevant topics above (See A&P) including, but not limited to diagnosis and management of acute and chronic medical conditions.   Thank you for allowing me to participate in the care of this very pleasant patient.    Jacquelin Hawking, NP [place] [facility] Cell - [phone] Pager- [phone] 08/24/2020 12:59 PM

## 2020-08-24 NOTE — Addendum Note (Signed)
Addended by: Faythe Casa E on: 08/24/2020 01:08 PM   Modules accepted: Level of Service

## 2020-09-23 ENCOUNTER — Ambulatory Visit: Payer: PPO | Admitting: Physician Assistant

## 2020-10-16 ENCOUNTER — Other Ambulatory Visit: Payer: Self-pay | Admitting: Physician Assistant

## 2020-11-01 ENCOUNTER — Ambulatory Visit: Payer: PPO | Admitting: Physician Assistant

## 2020-11-01 ENCOUNTER — Encounter: Payer: Self-pay | Admitting: Physician Assistant

## 2020-11-01 ENCOUNTER — Other Ambulatory Visit: Payer: Self-pay

## 2020-11-01 VITALS — BP 122/60 | HR 62 | Ht 62.0 in | Wt 175.0 lb

## 2020-11-01 DIAGNOSIS — I25118 Atherosclerotic heart disease of native coronary artery with other forms of angina pectoris: Secondary | ICD-10-CM | POA: Diagnosis not present

## 2020-11-01 DIAGNOSIS — E785 Hyperlipidemia, unspecified: Secondary | ICD-10-CM

## 2020-11-01 DIAGNOSIS — I2510 Atherosclerotic heart disease of native coronary artery without angina pectoris: Secondary | ICD-10-CM

## 2020-11-01 DIAGNOSIS — R0602 Shortness of breath: Secondary | ICD-10-CM

## 2020-11-01 DIAGNOSIS — M1990 Unspecified osteoarthritis, unspecified site: Secondary | ICD-10-CM

## 2020-11-01 DIAGNOSIS — E7800 Pure hypercholesterolemia, unspecified: Secondary | ICD-10-CM

## 2020-11-01 DIAGNOSIS — R079 Chest pain, unspecified: Secondary | ICD-10-CM

## 2020-11-01 DIAGNOSIS — Z951 Presence of aortocoronary bypass graft: Secondary | ICD-10-CM | POA: Diagnosis not present

## 2020-11-01 DIAGNOSIS — R0789 Other chest pain: Secondary | ICD-10-CM

## 2020-11-01 DIAGNOSIS — I5032 Chronic diastolic (congestive) heart failure: Secondary | ICD-10-CM

## 2020-11-01 DIAGNOSIS — R5381 Other malaise: Secondary | ICD-10-CM | POA: Diagnosis not present

## 2020-11-01 MED ORDER — EZETIMIBE 10 MG PO TABS: 10.0000 mg | ORAL_TABLET | Freq: Every day | ORAL | 2 refills | Status: DC

## 2020-11-01 MED ORDER — NITROGLYCERIN 0.4 MG SL SUBL: 0.4000 mg | SUBLINGUAL_TABLET | SUBLINGUAL | 3 refills | Status: DC | PRN

## 2020-11-01 NOTE — Progress Notes (Signed)
Office Visit    Patient Name: Bonnie Hancock Date of Encounter: 11/01/2020  PCP:  Albina Billet, MD   [place]  Cardiologist:  Ida Rogue, MD  Advanced Practice Provider:  No care team member to display Electrophysiologist:  None :[id number]}   Chief Complaint    Chief Complaint  Patient presents with   Other    6 month follow up -- patient c.o swelling in ankles. Med revieweds verbally with patient.     80 y.o. female with history of CAD s/p three-vessel CABG 09/04/2019, HFpEF, hyperlipidemia, hypothyroidism, obesity, arthritis, depression, and anxiety, and here for 62-monthfollow-up.  Past Medical History    Past Medical History:  Diagnosis Date   Anxiety    Arthritis    Cancer ([place]    UTERINE   Coronary artery disease    Depression    Dyspnea    Heart murmur    HOH (hard of hearing)    Hyperlipidemia    Hypothyroidism    Non-STEMI (non-ST elevated myocardial infarction) ([facility]    Past Surgical History:  Procedure Laterality Date   APPENDECTOMY     CATARACT EXTRACTION W/PHACO Left 09/17/2017   Procedure: CATARACT EXTRACTION PHACO AND INTRAOCULAR LENS PLACEMENT ([place];  Surgeon: PBirder Robson MD;  Location: [facility] ORS;  Service: Ophthalmology;  Laterality: Left;  [place]48 AP% 13.0 CDE 6.26 Fluid pack lot # [id number]   CATARACT EXTRACTION W/PHACO Right 10/15/2017   Procedure: CATARACT EXTRACTION PHACO AND INTRAOCULAR LENS PLACEMENT (IOC);  Surgeon: PBirder Robson MD;  Location: [facility] ORS;  Service: Ophthalmology;  Laterality: Right;  [place] 00:44 AP% 16.1 CDE 7.23 Fluid pack lot # [id number]   CHOLECYSTECTOMY     CORONARY ARTERY BYPASS GRAFT N/A 09/04/2019   Procedure: CORONARY ARTERY BYPASS GRAFTING (CABG), ON PUMP, TIMES THREE, LIMA TO LAD, SVG TO PDA, SVG TO OM 2;  Surgeon: VIvin Poot MD;  [place]  Service: Open Heart Surgery;  Laterality: N/A;   ENDOVEIN HARVEST OF GREATER SAPHENOUS VEIN Right 09/04/2019    Procedure: ENDOVEIN HARVEST OF GREATER SAPHENOUS VEIN;  Surgeon: VIvin Poot MD;  [place] [facility]  Service: Open Heart Surgery;  Laterality: Right;   LEFT HEART CATH AND CORONARY ANGIOGRAPHY N/A 08/27/2019   Procedure: LEFT HEART CATH AND CORONARY ANGIOGRAPHY;  Surgeon: GMinna Merritts MD;  [facility];  Service: Cardiovascular;  Laterality: N/A;   TEE WITHOUT CARDIOVERSION N/A 09/04/2019   Procedure: TRANSESOPHAGEAL ECHOCARDIOGRAM (TEE);  Surgeon: VPrescott Gum PCollier Salina MD;  [place] [facility]  Service: Open Heart Surgery;  Laterality: N/A;    Allergies  Allergies  Allergen Reactions   Statins Other (See Comments)    Muscle weakness    History of Present Illness    RKYRIE BUNis a 80y.o. female with PMH as above.  She has history of non-STEMI 08/2019 with troponin 8900.  She has history of a rash on Plavix per review of documentation.  Catheterization 7/21 showed three-vessel CAD as below.  She underwent CABG 09/04/2019 with LIMA to LAD, SVG to PDA, SVG to OM2.  At a recent visit on 05/11/2020, she reported violent tremors while maintaining consciousness and conversation.  She was evaluated by neurology and head scan/EEG nonspecific.  She was being evaluated for dementia.  She reported problems with short-term memory and confusion.  She had difficulty preparing meals and completing activities.  She reported sometimes feeling better after eating.  She was thinking  of restarting water aerobics.  A regular walking regimen was recommended.  She had discontinued Lasix with recommendation to take Lasix for ankle swelling.  Water aerobics was encouraged.  Today, 11/01/2020, she returns to clinic and notes left arm numbness that went from her hand to her elbow and occurred while putting dishes in the dishwasher.  This lasted 3 to 5 minutes then self resolved.  She has recent shortness of breath and fatigue with minimal exertion.  When she walks down the hall, she reports that it is  hard for her to breathe.  She also feels significantly fatigued.  She feels better with rest.  She also reports chest pain, though this is described as a burning/stinging/tingling that occurs in the center of her chest.  She attributes it to nerve pain from her sternotomy after her CABG.  She reports that it is constant, though it feels better when she puts her bra on and feels significantly worse when she takes it off.  She denies any recent infection, though her daughter reports that her incision has recently looked very red, especially where her bra rubs towards the end of the incision.  When the above symptoms are compared to her symptoms before her non-STEMI, she does report she some left arm issues, though this was more of a discomfort than a numbness.  She does not remember chest pain before her non-STEMI.  She reports some unsteadiness when ambulating, though she goes slow and uses a walker/cane.  Per daughter, she also often grabs onto the railings of the porch to help her walk and to be cautious.  She sometimes trips, though she has not had any recent complete falls.  She has not hit her head.  She reports ongoing back pain.  She wonders about her memory issues, though her daughter reports these are inconsistent and mild.  Home Medications   Current Outpatient Medications  Medication Instructions   acetaminophen (TYLENOL) 500 mg, Oral, Every 6 hours PRN   aspirin EC 81 mg, Oral, Daily, Swallow whole.   escitalopram (LEXAPRO) 10 mg, Oral, Daily   ezetimibe (ZETIA) 10 mg, Oral, Daily   levothyroxine (SYNTHROID) 50 mcg, Oral, Daily before breakfast   nitroGLYCERIN (NITROSTAT) 0.4 mg, Sublingual, Every 5 min x3 PRN   rosuvastatin (CRESTOR) 10 MG tablet Take 1 tablet by mouth once daily     Review of Systems    She reports recent chest pain/burning/stinging/tingling that feels better with the pressure of her bra and worse when she takes her bra off at night.  She reports unsteadiness when  walking but denies any recent falls.  No syncope.  She reports dyspnea with minimal exertion and that she is easily fatigued, which is new for her.  She wonders about some scabbing and redness of her incision but denies any recent infection or cuts.  She reports back pain.  She denies palpitations, pnd, orthopnea, n, v, dizziness, syncope, edema, weight gain, or early satiety.  All other systems reviewed and are otherwise negative except as noted above.  Physical Exam    VS:  BP 122/60 (BP Location: Left Arm, Patient Position: Sitting, Cuff Size: Normal)   Pulse 62   Ht 5' 2"  (1.575 m)   Wt 175 lb (79.4 kg)   LMP  (LMP Unknown)   BMI 32.01 kg/m  , BMI Body mass index is 32.01 kg/m. GEN: Well nourished, well developed, in no acute distress.  Joined by her daughter. HEENT: normal. Neck: Supple, no JVD, carotid bruits,  or masses. Cardiac: RRR, no murmurs, rubs, or gallops. No clubbing, cyanosis.  Mild bilateral nonpitting edema.  Radials/DP/PT 2+ and equal bilaterally.  Respiratory:  Respirations regular and unlabored, clear to auscultation bilaterally. GI: Soft, nontender, nondistended, BS + x 4. MS: no deformity or atrophy. Skin: warm and dry, no rash.  Sternotomy incision without significant redness, though some skin changes and irritation noted in the area that rubs against her bra and towards the lower part of her sternum. Neuro:  Strength and sensation are intact. Psych: Normal affect.  Accessory Clinical Findings    ECG personally reviewed by me today -NSR, 62 bpm, QTC 438 ms, T wave inversion noted in V1, V2 as seen on prior tracings.  T wave inversion and Q waves in 3, aVF and as seen on 09/23/2019- no acute changes.  VITALS Reviewed today   Temp Readings from Last 3 Encounters:  07/19/20 97.9 F (36.6 C) (Tympanic)  11/18/19 97.7 F (36.5 C)  11/04/19 97.6 F (36.4 C) (Skin)   BP Readings from Last 3 Encounters:  11/01/20 122/60  07/19/20 119/90  05/11/20 (!) 114/58    Pulse Readings from Last 3 Encounters:  11/01/20 62  07/19/20 66  05/11/20 60    Wt Readings from Last 3 Encounters:  11/01/20 175 lb (79.4 kg)  07/19/20 177 lb 12.8 oz (80.6 kg)  05/11/20 176 lb 8 oz (80.1 kg)     LABS  reviewed today   Lab Results  Component Value Date   WBC 6.5 10/09/2019   HGB 11.1 10/09/2019   HCT 34.4 10/09/2019   MCV 90 10/09/2019   PLT 281 10/09/2019   Lab Results  Component Value Date   CREATININE 0.77 11/04/2019   BUN 16 11/04/2019   NA 141 11/04/2019   K 4.0 11/04/2019   CL 109 11/04/2019   CO2 26 11/04/2019   Lab Results  Component Value Date   ALT 10 11/04/2019   AST 14 11/04/2019   BILITOT 0.6 11/04/2019   Lab Results  Component Value Date   CHOL 178 08/27/2019   HDL 35 (L) 08/27/2019   LDLCALC 102 (H) 08/27/2019   TRIG 206 (H) 08/27/2019   CHOLHDL 5.1 08/27/2019    Lab Results  Component Value Date   HGBA1C 5.9 (H) 09/01/2019   Lab Results  Component Value Date   TSH 0.95 11/04/2019     STUDIES/PROCEDURES reviewed today   Cardiac monitoring 11/06/2019 Event monitor Normal sinus rhythm avg HR of 72 bpm.  6 Supraventricular Tachycardia/atrial tachycardia  runs occurred, the run with the fastest interval lasting 6 beats with a max rate of 171 bpm, the longest lasting 17 beats with an avg rate of 100 bpm. Idioventricular Rhythm was present. Isolated SVEs were rare (<1.0%), SVE Couplets were rare (<1.0%), and SVE Triplets were rare (<1.0%). Isolated VEs were rare (<1.0%, 398),  VE Couplets were rare (<1.0%, 4), and VE Triplets were rare (<1.0%, 1). Ventricular Bigeminy was present. No patient triggered events noted   TEE 09/04/2019 POST-OP IMPRESSIONS  - Left Ventricle: The left ventricle is unchanged from pre-bypass.  - Aorta: The aorta appears unchanged from pre-bypass.  - Left Atrial Appendage: The left atrial appendage appears unchanged from  pre-bypass.  - Aortic Valve: The aortic valve appears unchanged from  pre-bypass.  - Mitral Valve: Slight improvement of MR post.  - Tricuspid Valve: The tricuspid valve appears unchanged from pre-bypass.  PRE-OP FINDINGS   Left Ventricle: The left ventricle has normal systolic function, with  an  ejection fraction of 55-60%. The cavity size was normal. There is no  increase in left ventricular wall thickness.  Right Ventricle: The right ventricle has normal systolic function. The  cavity was mildly enlarged. There is no increase in right ventricular wall  thickness.  Left Atrium: Left atrial size was dilated. The left atrial appendage is  well visualized and there is evidence of thrombus present.  Right Atrium:  Interatrial Septum: No atrial level shunt detected by color flow Doppler.  Pericardium: There is no evidence of pericardial effusion.  Mitral Valve: The mitral valve is normal in structure. Mild thickening of  the mitral valve leaflet. Mitral valve regurgitation is mild to moderate  by color flow Doppler. The MR jet is centrally-directed. There is no  evidence of mitral valve vegetation.  Tricuspid Valve: The tricuspid valve was normal in structure. Tricuspid  valve regurgitation is mild by color flow Doppler. The jet is directed  toward the atrial septum. There is no evidence of tricuspid valve  vegetation.  Aortic Valve: The aortic valve is tricuspid There is mild thickening of  the aortic valve Aortic valve regurgitation is mild by color flow Doppler.  There is no evidence of aortic valve stenosis. There is no evidence of a  vegetation on the aortic valve.  Pulmonic Valve: The pulmonic valve was normal in structure.  Pulmonic valve regurgitation is mild by color flow Doppler.  Aorta: There is evidence of atheroma immobile plaque in the descending  aorta; Grade II, measuring 2-65m in size.   Carotid ultrasound pre-CABG 09/01/2019 Summary:  Right Carotid: Velocities in the right ICA are consistent with a 1-39%  stenosis.  Left Carotid:  Velocities in the left ICA are consistent with a 1-39%  stenosis.  Vertebrals: Bilateral vertebral arteries demonstrate antegrade flow.  Right Upper Extremity: Doppler waveforms remain within normal limits with  right radial compression. Doppler waveform obliterate with right ulnar  compression.  Left Upper Extremity: Doppler waveform obliterate with left radial  compression. Doppler waveforms remain within normal limits with left ulnar  compression.     LHC 08/27/2019 Mid LM to Dist LM lesion is 70% stenosed. Ost LAD to Prox LAD lesion is 80% stenosed. Prox Cx to Mid Cx lesion is 70% stenosed. Prox RCA to Mid RCA lesion is 99% stenosed. 2nd Mrg lesion is 60% stenosed. Mid LAD lesion is 50% stenosed. The left ventricular ejection fraction is 45-50% by visual estimate. There is no mitral valve regurgitation. LV end diastolic pressure is mildly elevated. There is mild left ventricular systolic dysfunction.  Left Heart  Left Ventricle The left ventricular size is normal. There is mild left ventricular systolic dysfunction. LV end diastolic pressure is mildly elevated. The left ventricular ejection fraction is 45-50% by visual estimate. There are LV function abnormalities due to segmental dysfunction. There is no evidence of mitral regurgitation.   Coronary Diagrams  Diagnostic Dominance: Right    Echo 08/26/2019  1. Left ventricular ejection fraction, by estimation, is 60 to 65%. The  left ventricle has normal function. The left ventricle has no regional  wall motion abnormalities. Left ventricular diastolic function could not  be evaluated.   2. Right ventricular systolic function is normal. The right ventricular  size is normal.   3. The mitral valve is normal in structure. Trivial mitral valve  regurgitation. No evidence of mitral stenosis.   4. The aortic valve is normal in structure. Aortic valve regurgitation is  not visualized. No aortic stenosis is present.  5. The  inferior vena cava is normal in size with greater than 50%  respiratory variability, suggesting right atrial pressure of 3 mmHg.   Assessment & Plan    Coronary artery disease s/p CABG --Reports recent dyspnea and fatigue with minimal exertion.  Also reports atypical chest pain that improves with pressure, as well as left arm numbness.  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  Given her history above, recommendation is for further work-up with echo +/- repeat ischemic evaluation pending echo results or if ongoing or worsening symptoms.  Return precautions reviewed.  We did discuss that her symptoms could be due to back issues/neuropathy and deconditioning as well.  She appears euvolemic as below.  Will obtain labs as below.  She is no longer on Plavix secondary to rash.  Continue ASA, Crestor, Zetia.  No beta-blocker 2/2 weakness.  HFpEF --Euvolemic and well compensated on exam.  She continues on Lasix.  Recheck c-Met.  Continue to monitor salt and fluid intake, weights, BP.  Given recent symptoms of dyspnea with minimal exertion and chest burning/left arm this as above, will obtain echo with further recommendations after that time.  Hyperlipidemia - Continue Crestor and Zetia.  Recheck LDL/LFTs.  Weakness/deconditioning --Continue to ambulate with a cane and walker.  Recommend increase activity as tolerated to combat deconditioning.  Consider water aerobics, she has not yet been able to restart this exercise routine.  Obtain CBC, c-Met, TSH, free T4.  Medication changes: None Labs ordered: CBC, c-Met, lipid, direct LDL, TSH, free T4 Studies / Imaging ordered: Echo Future considerations:?Further ischemic work-up, ?  CTS recommendations based on described symptoms (will message)/ Gabapentin Disposition: RTC after echo  *Please be aware that the above documentation was completed voice recognition software and may contain  dictation errors.    Arvil Chaco, PA-C 11/01/2020

## 2020-11-01 NOTE — Patient Instructions (Signed)
Medication Instructions:   Your physician recommends that you continue on your current medications as directed. Please refer to the Current Medication list given to you today.  We have sent refills for your Zetia and Nitroglycerin  *If you need a refill on your cardiac medications before your next appointment, please call your pharmacy*   Lab Work:  TODAY: CBC, CMET, Lipid panel, Direct LDL, TSH, Free T4  If you have labs (blood work) drawn today and your tests are completely normal, you will receive your results only by: [place] (if you have MyChart) OR A paper copy in the mail If you have any lab test that is abnormal or we need to change your treatment, we will call you to review the results.   Testing/Procedures:  Your physician has requested that you have an echocardiogram. Echocardiography is a painless test that uses sound waves to create images of your heart. It provides your doctor with information about the size and shape of your heart and how well your heart's chambers and valves are working. This procedure takes approximately one hour. There are no restrictions for this procedure.   Follow-Up: At [facility], you and your health needs are our priority.  As part of our continuing mission to provide you with exceptional heart care, we have created designated Provider Care Teams.  These Care Teams include your primary Cardiologist (physician) and Advanced Practice Providers (APPs -  Physician Assistants and Nurse Practitioners) who all work together to provide you with the care you need, when you need it.  We recommend signing up for the patient portal called "MyChart".  Sign up information is provided on this After Visit Summary.  MyChart is used to connect with patients for Virtual Visits (Telemedicine).  Patients are able to view lab/test results, encounter notes, upcoming appointments, etc.  Non-urgent messages can be sent to your provider as well.   To learn more  about what you can do with MyChart, go to [email or web address].    Your next appointment:    Follow up after Echo  The format for your next appointment:   In Person  Provider:   You may see Ida Rogue, MD or one of the following Advanced Practice Providers on your designated Care Team:   Murray Hodgkins, NP Christell Faith, PA-C Marrianne Mood, PA-C Cadence Froid, [place]

## 2020-11-02 LAB — COMPREHENSIVE METABOLIC PANEL
ALT: 10 IU/L (ref 0–32)
AST: 18 IU/L (ref 0–40)
Albumin/Globulin Ratio: 2.1 (ref 1.2–2.2)
Albumin: 4.4 g/dL (ref 3.6–4.6)
Alkaline Phosphatase: 59 IU/L (ref 44–121)
BUN/Creatinine Ratio: 21 (ref 12–28)
BUN: 20 mg/dL (ref 8–27)
Bilirubin Total: 0.4 mg/dL (ref 0.0–1.2)
CO2: 22 mmol/L (ref 20–29)
Calcium: 9.5 mg/dL (ref 8.7–10.3)
Chloride: 107 mmol/L — ABNORMAL HIGH (ref 96–106)
Creatinine, Ser: 0.95 mg/dL (ref 0.57–1.00)
Globulin, Total: 2.1 g/dL (ref 1.5–4.5)
Glucose: 93 mg/dL (ref 65–99)
Potassium: 4.2 mmol/L (ref 3.5–5.2)
Sodium: 143 mmol/L (ref 134–144)
Total Protein: 6.5 g/dL (ref 6.0–8.5)
eGFR: 60 mL/min/{1.73_m2} (ref >59)

## 2020-11-02 LAB — LDL CHOLESTEROL, DIRECT: LDL Direct: 97 mg/dL (ref 0–99)

## 2020-11-02 LAB — LIPID PANEL
Chol/HDL Ratio: 3.9 ratio (ref 0.0–4.4)
Cholesterol, Total: 164 mg/dL (ref 100–199)
HDL: 42 mg/dL (ref >39)
LDL Chol Calc (NIH): 94 mg/dL (ref 0–99)
Triglycerides: 163 mg/dL — ABNORMAL HIGH (ref 0–149)
VLDL Cholesterol Cal: 28 mg/dL (ref 5–40)

## 2020-11-02 LAB — CBC
Hematocrit: 40.1 % (ref 34.0–46.6)
Hemoglobin: 13.3 g/dL (ref 11.1–15.9)
MCH: 30.9 pg (ref 26.6–33.0)
MCHC: 33.2 g/dL (ref 31.5–35.7)
MCV: 93 fL (ref 79–97)
Platelets: 177 10*3/uL (ref 150–450)
RBC: 4.31 x10E6/uL (ref 3.77–5.28)
RDW: 12.8 % (ref 11.7–15.4)
WBC: 6.2 10*3/uL (ref 3.4–10.8)

## 2020-11-02 LAB — TSH: TSH: 1.91 u[IU]/mL (ref 0.450–4.500)

## 2020-11-02 LAB — T4, FREE: Free T4: 0.97 ng/dL (ref 0.82–1.77)

## 2020-11-03 ENCOUNTER — Encounter: Payer: Self-pay | Admitting: Physician Assistant

## 2020-11-04 ENCOUNTER — Telehealth: Payer: Self-pay | Admitting: *Deleted

## 2020-11-04 DIAGNOSIS — Z789 Other specified health status: Secondary | ICD-10-CM

## 2020-11-04 DIAGNOSIS — E785 Hyperlipidemia, unspecified: Secondary | ICD-10-CM

## 2020-11-04 DIAGNOSIS — E781 Pure hyperglyceridemia: Secondary | ICD-10-CM

## 2020-11-04 NOTE — Telephone Encounter (Signed)
Attempted to call pt.  No answer. No vm set up.  Will try to reach pt at a later time.

## 2020-11-04 NOTE — Telephone Encounter (Signed)
-----   Message from Arvil Chaco, PA-C sent at 11/03/2020  7:30 PM EDT ----- Kidney function, thyroid function, and blood counts normal. Triglycerides and LDL (bad cholesterol) elevated above goal.  She has a history of statin intolerance. If she has not been able to tolerate higher doses of Crestor before in the past, recommend refer to pharmacy for consideration of PCSK9i at this time.

## 2020-11-07 NOTE — Telephone Encounter (Signed)
Attempted to call pt at all numbers listed, no vm set up. Unable to leave vm.  Will try to reach pt at a later time.

## 2020-11-07 NOTE — Telephone Encounter (Signed)
Spoke with pt.  Notified of lab results and provider's recc.  Pt agreeable to lipid clinic referral.  Referral placed.  Pt has no further questions at this time.

## 2020-11-15 ENCOUNTER — Ambulatory Visit: Payer: PPO | Admitting: Cardiovascular Disease

## 2020-11-24 ENCOUNTER — Ambulatory Visit (INDEPENDENT_AMBULATORY_CARE_PROVIDER_SITE_OTHER): Payer: PPO | Admitting: Pharmacist

## 2020-11-24 ENCOUNTER — Other Ambulatory Visit: Payer: Self-pay

## 2020-11-24 ENCOUNTER — Telehealth: Payer: PPO

## 2020-11-24 DIAGNOSIS — E785 Hyperlipidemia, unspecified: Secondary | ICD-10-CM

## 2020-11-24 MED ORDER — ROSUVASTATIN CALCIUM 40 MG PO TABS: 40.0000 mg | ORAL_TABLET | Freq: Every day | ORAL | 3 refills | Status: DC

## 2020-11-24 NOTE — Progress Notes (Signed)
Patient ID: Bonnie Hancock                 DOB: 09-03-1940                    MRN: [id number]     HPI: Bonnie Hancock is a 80 y.o. female patient of Dr Rockey Situ referred to lipid clinic by Marrianne Mood, PA. PMH is significant for CAD s/p 3v CABG 08/2019, HFpEF, HLD, obesity, hypothyroidism, arthritis, depression, and anxiety.  Today's visit conducted on the phone as pt unable to drive to clinic. Husband on the phone as well. Pt unsure what she takes, her husband is also unsure but looked through pt's medications and confirmed she is taking both rosuvastatin and ezetimibe each day. Reports tolerating medications well. Has muscle aches to statins listed on allergy list, but pt unsure what she had trouble tolerating. Has previously been prescribed pravastatin and fenofibrate. Pt reports some short-term memory issues and confusion. She is not interested in injectable cholesterol medication.   Current Medications: rosuvastatin 10mg  daily, ezetimibe 10mg  daily Intolerances: statins - muscle weakness. Unsure which statins specifically caused issues Risk Factors: CAD s/p CABG, FHx of CAD LDL goal: 70mg /dL  Exercise: Fatigue with minimal exertion  Family History: Father with MI, mother with heart disease.  Social History: Denies alcohol, tobacco, and drug use.  Labs: 11/01/20: TC 164, TG 163, HDL 42, LDL-D 97 (rosuvastatin 10mg  daily, ezetimibe 10mg  daily)  Past Medical History:  Diagnosis Date   Anxiety    Arthritis    Cancer ([place])    UTERINE   Coronary artery disease    Depression    Dyspnea    Heart murmur    HOH (hard of hearing)    Hyperlipidemia    Hypothyroidism    Non-STEMI (non-ST elevated myocardial infarction) ([place])     Current Outpatient Medications on File Prior to Visit  Medication Sig Dispense Refill   acetaminophen (TYLENOL) 500 MG tablet Take 500 mg by mouth every 6 (six) hours as needed for moderate pain or headache.     aspirin EC 81 MG tablet Take 1 tablet (81 mg  total) by mouth daily. Swallow whole. 90 tablet 3   escitalopram (LEXAPRO) 10 MG tablet Take 10 mg by mouth daily.      ezetimibe (ZETIA) 10 MG tablet Take 1 tablet (10 mg total) by mouth daily. 90 tablet 2   levothyroxine (SYNTHROID) 50 MCG tablet Take 50 mcg by mouth daily before breakfast.     nitroGLYCERIN (NITROSTAT) 0.4 MG SL tablet Place 1 tablet (0.4 mg total) under the tongue every 5 (five) minutes x 3 doses as needed for chest pain. 25 tablet 3   rosuvastatin (CRESTOR) 10 MG tablet Take 1 tablet by mouth once daily 90 tablet 2   No current facility-administered medications on file prior to visit.    Allergies  Allergen Reactions   Statins Other (See Comments)    Muscle weakness    Assessment/Plan:  1. Hyperlipidemia - LDL 97 on rosuvastatin 10mg  daily and ezetimibe 10mg  daily, above goal < 70 due to ASCVD. She has myalgias with statins listed on her allergy list, but doesn't recall which medications specifically caused this. She is tolerating her current medications well. She is not interested in injectable PCSK9i therapy and her insurance does not cover Nexletol. She is agreeable to increase her dose of rosuvastatin though. Will increase to 40mg  daily to better target LDL goal < 70 and will have her  continue ezetimibe. Recommend rechecking labs in a few months either with her PCP or at cardiology follow up.  Jaanvi Fizer E. [place] Zaragoza, PharmD, BCACP, [place] [address], [place], [place] [address] Phone: [phone]; Fax: [phone] 11/24/2020 2:12 PM

## 2020-12-02 ENCOUNTER — Other Ambulatory Visit: Payer: Self-pay

## 2020-12-02 ENCOUNTER — Ambulatory Visit (INDEPENDENT_AMBULATORY_CARE_PROVIDER_SITE_OTHER): Payer: PPO

## 2020-12-02 DIAGNOSIS — R0602 Shortness of breath: Secondary | ICD-10-CM

## 2020-12-02 DIAGNOSIS — R0789 Other chest pain: Secondary | ICD-10-CM | POA: Diagnosis not present

## 2020-12-02 LAB — ECHOCARDIOGRAM COMPLETE
AR max vel: 1.55 cm2
AV Area VTI: 1.67 cm2
AV Area mean vel: 1.64 cm2
AV Mean grad: 5 mmHg
AV Peak grad: 9.2 mmHg
Ao pk vel: 1.52 m/s
Area-P 1/2: 3.51 cm2
S' Lateral: 2.5 cm
Single Plane A4C EF: 58.6 %

## 2020-12-06 ENCOUNTER — Other Ambulatory Visit: Payer: Self-pay

## 2020-12-06 ENCOUNTER — Ambulatory Visit: Payer: PPO | Admitting: Cardiovascular Disease

## 2020-12-06 ENCOUNTER — Encounter: Payer: Self-pay | Admitting: Cardiovascular Disease

## 2020-12-06 VITALS — BP 140/60 | HR 62 | Ht 62.0 in | Wt 178.0 lb

## 2020-12-06 DIAGNOSIS — E785 Hyperlipidemia, unspecified: Secondary | ICD-10-CM | POA: Diagnosis not present

## 2020-12-06 DIAGNOSIS — M791 Myalgia, unspecified site: Secondary | ICD-10-CM

## 2020-12-06 DIAGNOSIS — Z951 Presence of aortocoronary bypass graft: Secondary | ICD-10-CM | POA: Diagnosis not present

## 2020-12-06 DIAGNOSIS — E781 Pure hyperglyceridemia: Secondary | ICD-10-CM

## 2020-12-06 DIAGNOSIS — T466X5A Adverse effect of antihyperlipidemic and antiarteriosclerotic drugs, initial encounter: Secondary | ICD-10-CM

## 2020-12-06 DIAGNOSIS — I5032 Chronic diastolic (congestive) heart failure: Secondary | ICD-10-CM | POA: Diagnosis not present

## 2020-12-06 DIAGNOSIS — R0602 Shortness of breath: Secondary | ICD-10-CM | POA: Diagnosis not present

## 2020-12-06 DIAGNOSIS — I25118 Atherosclerotic heart disease of native coronary artery with other forms of angina pectoris: Secondary | ICD-10-CM

## 2020-12-06 MED ORDER — ROSUVASTATIN CALCIUM 10 MG PO TABS: 10.0000 mg | ORAL_TABLET | Freq: Every day | ORAL | 3 refills | Status: DC

## 2020-12-06 NOTE — Progress Notes (Signed)
Patient ID: Bonnie Hancock, female   DOB: [birth year], 80 y.o.   MRN: [id number] Cardiology Office Note  Date:  12/06/2020   ID:  Bonnie Hancock, DOB 11/26/1940, MRN [id number]  PCP:  Albina Billet, MD   Chief Complaint  Patient presents with   Follow up Echo     "Doing well." Medications reviewed by the patient verbally.     HPI:  Ms. Mullet is a 80 year-old woman,  With history of  Chronic diastolic CHF obesity,  Chronic shortness of breath with exertion Non-STEMI July 2021 troponin 8900 Who presents for routine follow-up of her coronary disease, S/P CABG, chronic shortness of breath  Presents today with her husband Last seen in clinic by myself March 2022 Rash on Plavix, this was held  Echo reviewed with her on today's visit Normal EF Mild to moderate MR  Not to lasix "I am doing a lot of peeing" Denies any leg swelling, no abdominal distention, feels her weight is stable  Goes shopping with her sister, otherwise no other exercise  Recently seen by pharmacy, encouraged to stay on her Crestor 10 with Zetia But not interested in PCSK9 inhibitor  Of past medical history reviewed  hospital with non-STEMI July 2021,  cardiac catheterization July 2021 Three-vessel coronary disease There is distal left main, ostial LAD, mid left circumflex, mid RCA disease, ostial/proximal OM disease Mildly depressed ejection fraction 45 to 50% with inferior wall hypokinesis  Underwent CABG September 04, 2019 CORONARY ARTERY BYPASS GRAFTING (CABG), ON PUMP, TIMES THREE, LIMA TO LAD, SVG TO PDA, SVG TO OM 2;   Notes reviewed from cardiothoracic surgery visit November 2021   PMH:   has a past medical history of Anxiety, Arthritis, Cancer ([place]), Coronary artery disease, Depression, Dyspnea, Heart murmur, HOH (hard of hearing), Hyperlipidemia, Hypothyroidism, and Non-STEMI (non-ST elevated myocardial infarction) ([place]).  PSH:    Past Surgical History:  Procedure Laterality Date    APPENDECTOMY     CATARACT EXTRACTION W/PHACO Left 09/17/2017   Procedure: CATARACT EXTRACTION PHACO AND INTRAOCULAR LENS PLACEMENT (IOC);  Surgeon: Birder Robson, MD;  Location: [facility];  Service: Ophthalmology;  Laterality: Left;  [place] 00:48 AP% 13.0 CDE 6.26 Fluid pack lot # [id number]   CATARACT EXTRACTION W/PHACO Right 10/15/2017   Procedure: CATARACT EXTRACTION PHACO AND INTRAOCULAR LENS PLACEMENT (IOC);  Surgeon: Birder Robson, MD;  Location: [facility];  Service: Ophthalmology;  Laterality: Right;  [place]  00:44 AP% 16.1 CDE 7.23 Fluid pack lot # [id number]   CHOLECYSTECTOMY     CORONARY ARTERY BYPASS GRAFT N/A 09/04/2019   Procedure: CORONARY ARTERY BYPASS GRAFTING (CABG), ON PUMP, TIMES THREE, LIMA TO LAD, SVG TO PDA, SVG TO OM 2;  Surgeon: Ivin Poot, MD;  Location: [place];  Service: Open Heart Surgery;  Laterality: N/A;   ENDOVEIN HARVEST OF GREATER SAPHENOUS VEIN Right 09/04/2019   Procedure: ENDOVEIN HARVEST OF GREATER SAPHENOUS VEIN;  Surgeon: Ivin Poot, MD;  Location: [place];  Service: Open Heart Surgery;  Laterality: Right;   LEFT HEART CATH AND CORONARY ANGIOGRAPHY N/A 08/27/2019   Procedure: LEFT HEART CATH AND CORONARY ANGIOGRAPHY;  Surgeon: Minna Merritts, MD;  Location: [place] [facility];  Service: Cardiovascular;  Laterality: N/A;   TEE WITHOUT CARDIOVERSION N/A 09/04/2019   Procedure: TRANSESOPHAGEAL ECHOCARDIOGRAM (TEE);  Surgeon: Prescott Gum, Collier Salina, MD;  Location: [place];  Service: Open Heart Surgery;  Laterality: N/A;    Current Outpatient Medications  Medication Sig Dispense Refill   acetaminophen (  TYLENOL) 500 MG tablet Take 500 mg by mouth every 6 (six) hours as needed for moderate pain or headache.     aspirin EC 81 MG tablet Take 1 tablet (81 mg total) by mouth daily. Swallow whole. 90 tablet 3   escitalopram (LEXAPRO) 10 MG tablet Take 1.5 mg by mouth daily.     ezetimibe (ZETIA) 10 MG tablet Take 1 tablet (10 mg total) by mouth daily. 90 tablet 2    levothyroxine (SYNTHROID) 50 MCG tablet Take 50 mcg by mouth daily before breakfast.     nitroGLYCERIN (NITROSTAT) 0.4 MG SL tablet Place 1 tablet (0.4 mg total) under the tongue every 5 (five) minutes x 3 doses as needed for chest pain. 25 tablet 3   NON FORMULARY 300 mg daily. CBD GUMMIES     rosuvastatin (CRESTOR) 10 MG tablet Take 10 mg by mouth daily.     Thiamine HCl (VITAMIN B-1) 250 MG tablet Take 250 mg by mouth daily.     No current facility-administered medications for this visit.     Allergies:   Statins   Social History:  The patient  reports that she has never smoked. She has never used smokeless tobacco. She reports that she does not currently use alcohol. She reports that she does not use drugs.   Family History:   family history includes Emphysema in her mother; Heart attack in her father; Heart disease in her mother.    Review of Systems: Review of Systems  Constitutional: Negative.   HENT: Negative.    Respiratory: Negative.    Cardiovascular: Negative.   Gastrointestinal: Negative.   Musculoskeletal:  Positive for joint pain and neck pain.  Psychiatric/Behavioral: Negative.    All other systems reviewed and are negative.  PHYSICAL EXAM: VS:  BP 140/60 (BP Location: Left Arm, Patient Position: Sitting, Cuff Size: Normal)   Pulse 62   Ht 5\' 2"  (1.575 m)   Wt 178 lb (80.7 kg)   LMP  (LMP Unknown)   SpO2 99%   BMI 32.56 kg/m  , BMI Body mass index is 32.56 kg/m.  Constitutional:  oriented to person, place, and time. No distress.  HENT:  Head: Grossly normal Eyes:  no discharge. No scleral icterus.  Neck: No JVD, no carotid bruits  Cardiovascular: Regular rate and rhythm, no murmurs appreciated Pulmonary/Chest: Clear to auscultation bilaterally, no wheezes or rails Abdominal: Soft.  no distension.  no tenderness.  Musculoskeletal: Normal range of motion Neurological:  normal muscle tone. Coordination normal. No atrophy Skin: Skin warm and  dry Psychiatric: normal affect, pleasant   Recent Labs: 11/01/2020: ALT 10; BUN 20; Creatinine, Ser 0.95; Hemoglobin 13.3; Platelets 177; Potassium 4.2; Sodium 143; TSH 1.910   results have been requested   Lipid Panel Lab Results  Component Value Date   CHOL 164 11/01/2020   HDL 42 11/01/2020   LDLCALC 94 11/01/2020   TRIG 163 (H) 11/01/2020    Wt Readings from Last 3 Encounters:  12/06/20 178 lb (80.7 kg)  11/01/20 175 lb (79.4 kg)  07/19/20 177 lb 12.8 oz (80.6 kg)     ASSESSMENT AND PLAN:   Hyperlipidemia - Plan: EKG 12-Lead On crestor and zetia Recommend she stay on the above medications Does not want PCSK9 inhibitor  SOB (shortness of breath) - Recommend she restart her walking program Reports she goes walking, shopping, no symptoms  Chronic diastolic CHF Euvolemic, off lasix Does not even remember being on Lasix, does not know if she has any  at home Recommend she call us for worsening ankle swelling  CAD, s/p CABG Currently with no symptoms of angina. No further workup at this time. Continue current medication regimen.  Arthritis Neck, shoulders Recommended stretching program, exercise  Tremor Prior spells, etiology unclear, none recently     Total encounter time more than 25 minutes  Greater than 50% was spent in counseling and coordination of care with the patient    No orders of the defined types were placed in this encounter.    Signed, Esmond Plants, M.D., Ph.D. 12/06/2020  [place], [place]

## 2020-12-06 NOTE — Patient Instructions (Addendum)
Medication Instructions:  No changes  If you need a refill on your cardiac medications before your next appointment, please call your pharmacy.   Lab work: No new labs needed  Testing/Procedures: No new testing needed  Follow-Up: At [facility], you and your health needs are our priority.  As part of our continuing mission to provide you with exceptional heart care, we have created designated Provider Care Teams.  These Care Teams include your primary Cardiologist (physician) and Advanced Practice Providers (APPs -  Physician Assistants and Nurse Practitioners) who all work together to provide you with the care you need, when you need it.  You will need a follow up appointment in 12 months  Providers on your designated Care Team:   Christopher Berge, NP Ryan Dunn, PA-C Jacquelyn Visser, PA-C Cadence Furth, PA-C  COVID-19 Vaccine Information can be found at: [email or web address] For questions related to vaccine distribution or appointments, please email [email or web address] or call [phone].    

## 2021-01-03 ENCOUNTER — Emergency Department: Payer: PPO

## 2021-01-03 ENCOUNTER — Emergency Department
Admission: EM | Admit: 2021-01-03 | Discharge: 2021-01-03 | Disposition: A | Discharge status: Home / Self Care | Payer: PPO | Attending: Emergency Medicine | Admitting: Emergency Medicine

## 2021-01-03 ENCOUNTER — Other Ambulatory Visit: Payer: Self-pay

## 2021-01-03 DIAGNOSIS — W01198A Fall on same level from slipping, tripping and stumbling with subsequent striking against other object, initial encounter: Secondary | ICD-10-CM | POA: Diagnosis not present

## 2021-01-03 DIAGNOSIS — Z7982 Long term (current) use of aspirin: Secondary | ICD-10-CM | POA: Insufficient documentation

## 2021-01-03 DIAGNOSIS — R079 Chest pain, unspecified: Secondary | ICD-10-CM | POA: Diagnosis not present

## 2021-01-03 DIAGNOSIS — E039 Hypothyroidism, unspecified: Secondary | ICD-10-CM | POA: Diagnosis not present

## 2021-01-03 DIAGNOSIS — Z23 Encounter for immunization: Secondary | ICD-10-CM | POA: Insufficient documentation

## 2021-01-03 DIAGNOSIS — M2578 Osteophyte, vertebrae: Secondary | ICD-10-CM | POA: Diagnosis not present

## 2021-01-03 DIAGNOSIS — R52 Pain, unspecified: Secondary | ICD-10-CM | POA: Diagnosis not present

## 2021-01-03 DIAGNOSIS — I10 Essential (primary) hypertension: Secondary | ICD-10-CM | POA: Diagnosis not present

## 2021-01-03 DIAGNOSIS — S199XXA Unspecified injury of neck, initial encounter: Secondary | ICD-10-CM | POA: Diagnosis not present

## 2021-01-03 DIAGNOSIS — I959 Hypotension, unspecified: Secondary | ICD-10-CM | POA: Diagnosis not present

## 2021-01-03 DIAGNOSIS — Z8542 Personal history of malignant neoplasm of other parts of uterus: Secondary | ICD-10-CM | POA: Diagnosis not present

## 2021-01-03 DIAGNOSIS — S0121XA Laceration without foreign body of nose, initial encounter: Secondary | ICD-10-CM | POA: Insufficient documentation

## 2021-01-03 DIAGNOSIS — S0992XA Unspecified injury of nose, initial encounter: Secondary | ICD-10-CM | POA: Diagnosis present

## 2021-01-03 DIAGNOSIS — M852 Hyperostosis of skull: Secondary | ICD-10-CM | POA: Diagnosis not present

## 2021-01-03 DIAGNOSIS — W19XXXA Unspecified fall, initial encounter: Secondary | ICD-10-CM

## 2021-01-03 DIAGNOSIS — I2510 Atherosclerotic heart disease of native coronary artery without angina pectoris: Secondary | ICD-10-CM | POA: Insufficient documentation

## 2021-01-03 DIAGNOSIS — S022XXB Fracture of nasal bones, initial encounter for open fracture: Secondary | ICD-10-CM

## 2021-01-03 DIAGNOSIS — Z951 Presence of aortocoronary bypass graft: Secondary | ICD-10-CM | POA: Diagnosis not present

## 2021-01-03 DIAGNOSIS — S022XXA Fracture of nasal bones, initial encounter for closed fracture: Secondary | ICD-10-CM | POA: Diagnosis not present

## 2021-01-03 DIAGNOSIS — S0512XA Contusion of eyeball and orbital tissues, left eye, initial encounter: Secondary | ICD-10-CM | POA: Diagnosis not present

## 2021-01-03 DIAGNOSIS — S0990XA Unspecified injury of head, initial encounter: Secondary | ICD-10-CM | POA: Diagnosis not present

## 2021-01-03 DIAGNOSIS — R9082 White matter disease, unspecified: Secondary | ICD-10-CM | POA: Diagnosis not present

## 2021-01-03 MED ORDER — CEPHALEXIN 500 MG PO CAPS: 500.0000 mg | ORAL_CAPSULE | Freq: Four times a day (QID) | ORAL | 0 refills | Status: AC

## 2021-01-03 MED ORDER — TETANUS-DIPHTH-ACELL PERTUSSIS 5-2.5-18.5 LF-MCG/0.5 IM SUSY
0.5000 mL | PREFILLED_SYRINGE | Freq: Once | INTRAMUSCULAR | Status: AC
  Administered 2021-01-03: 0.5 mL via INTRAMUSCULAR
  Filled 2021-01-03: qty 0.5

## 2021-01-03 MED ORDER — LIDOCAINE HCL (PF) 1 % IJ SOLN
5.0000 mL | Freq: Once | INTRAMUSCULAR | Status: AC
  Administered 2021-01-03: 5 mL
  Filled 2021-01-03: qty 5

## 2021-01-03 NOTE — ED Provider Notes (Signed)
[facility] [place] Emergency Department Provider Note   ____________________________________________   Event Date/Time   First MD Initiated Contact with Patient 01/03/21 1948     (approximate)  I have reviewed the triage vital signs and the nursing notes.   HISTORY  Chief Complaint Fall    HPI Bonnie Hancock is a 80 y.o. female with past medical history of hyperlipidemia, CAD status post CABG, and hypothyroidism who presents to the ED following fall.  Patient reports that just prior to arrival she tripped on a dog gate in her home, falling forwards and hitting her face.  She believes she may have lost consciousness for a few seconds, but states she does not take any blood thinners.  She now complains of frontal headache and neck pain, denies any pain in her chest, abdomen, or extremities.  She has noticed some bleeding from her nose that has since stopped on its own.  She is unsure of her last tetanus.        Past Medical History:  Diagnosis Date   Anxiety    Arthritis    Cancer [facility])    UTERINE   Coronary artery disease    Depression    Dyspnea    Heart murmur    HOH (hard of hearing)    Hyperlipidemia    Hypothyroidism    Non-STEMI (non-ST elevated myocardial infarction)  [facility])     Patient Active Problem List   Diagnosis Date Noted   Short-term memory loss 01/06/2020   Encounter for post surgical wound check 11/04/2019   Postop check 10/07/2019   S/P CABG x 3 09/04/2019   CAD, multiple vessel 08/27/2019   NSTEMI (non-ST elevated myocardial infarction) ([place]) 08/26/2019   Anxiety 08/26/2019   Hypothyroidism 08/26/2019   Pure hypercholesterolemia    Arthritis 07/23/2016   Morbid obesity due to excess calories ([place]) 07/21/2015   Weakness 07/21/2015   Heartburn 07/20/2013   Back pain 07/20/2013   SOB (shortness of breath) 08/29/2010   Obesity 08/29/2010   Hyperlipidemia LDL goal <70 08/29/2010    Past Surgical History:  Procedure  Laterality Date   APPENDECTOMY     CATARACT EXTRACTION W/PHACO Left 09/17/2017   Procedure: CATARACT EXTRACTION PHACO AND INTRAOCULAR LENS PLACEMENT ([place]);  Surgeon: Birder Robson, MD;  Location: [facility];  Service: Ophthalmology;  Laterality: Left;  [place] 00:48 AP% 13.0 CDE 6.26 Fluid pack lot # [id number]   CATARACT EXTRACTION W/PHACO Right 10/15/2017   Procedure: CATARACT EXTRACTION PHACO AND INTRAOCULAR LENS PLACEMENT (IOC);  Surgeon: Birder Robson, MD;  Location: [facility];  Service: Ophthalmology;  Laterality: Right;  [place]  00:44 AP% 16.1 CDE 7.23 Fluid pack lot # [id number]   CHOLECYSTECTOMY     CORONARY ARTERY BYPASS GRAFT N/A 09/04/2019   Procedure: CORONARY ARTERY BYPASS GRAFTING (CABG), ON PUMP, TIMES THREE, LIMA TO LAD, SVG TO PDA, SVG TO OM 2;  Surgeon: Ivin Poot, MD;  Location: [place];  Service: Open Heart Surgery;  Laterality: N/A;   ENDOVEIN HARVEST OF GREATER SAPHENOUS VEIN Right 09/04/2019   Procedure: ENDOVEIN HARVEST OF GREATER SAPHENOUS VEIN;  Surgeon: Ivin Poot, MD;  Location: [place];  Service: Open Heart Surgery;  Laterality: Right;   LEFT HEART CATH AND CORONARY ANGIOGRAPHY N/A 08/27/2019   Procedure: LEFT HEART CATH AND CORONARY ANGIOGRAPHY;  Surgeon: Minna Merritts, MD;  Location: [place] [facility];  Service: Cardiovascular;  Laterality: N/A;   TEE WITHOUT CARDIOVERSION N/A 09/04/2019   Procedure: TRANSESOPHAGEAL ECHOCARDIOGRAM (TEE);  Surgeon: Prescott Gum, Collier Salina, MD;  Location: [place];  Service: Open Heart Surgery;  Laterality: N/A;    Prior to Admission medications   Medication Sig Start Date End Date Taking? Authorizing Provider  cephALEXin (KEFLEX) 500 MG capsule Take 1 capsule (500 mg total) by mouth 4 (four) times daily for 7 days. 01/03/21 01/10/21 Yes Blake Divine, MD  acetaminophen (TYLENOL) 500 MG tablet Take 500 mg by mouth every 6 (six) hours as needed for moderate pain or headache.    [provider]  aspirin EC 81 MG tablet Take 1  tablet (81 mg total) by mouth daily. Swallow whole. 10/02/19   Dunn, Areta Haber, PA-C  escitalopram (LEXAPRO) 10 MG tablet Take 1.5 mg by mouth daily.    [provider]  ezetimibe (ZETIA) 10 MG tablet Take 1 tablet (10 mg total) by mouth daily. 11/01/20   Marrianne Mood D, PA-C  levothyroxine (SYNTHROID) 50 MCG tablet Take 50 mcg by mouth daily before breakfast.    [provider]  nitroGLYCERIN (NITROSTAT) 0.4 MG SL tablet Place 1 tablet (0.4 mg total) under the tongue every 5 (five) minutes x 3 doses as needed for chest pain. 11/01/20   Marrianne Mood D, PA-C  NON FORMULARY 300 mg daily. CBD GUMMIES    [provider]  rosuvastatin (CRESTOR) 10 MG tablet Take 1 tablet (10 mg total) by mouth daily. 12/06/20   Minna Merritts, MD  Thiamine HCl (VITAMIN B-1) 250 MG tablet Take 250 mg by mouth daily.    [provider]    Allergies Statins  Family History  Problem Relation Age of Onset   Heart attack Father    Emphysema Mother    Heart disease Mother     Social History Social History   Tobacco Use   Smoking status: Never   Smokeless tobacco: Never  Vaping Use   Vaping Use: Never used  Substance Use Topics   Alcohol use: Not Currently   Drug use: No    Review of Systems  Constitutional: No fever/chills Eyes: No visual changes. ENT: No sore throat. Cardiovascular: Denies chest pain. Respiratory: Denies shortness of breath. Gastrointestinal: No abdominal pain.  No nausea, no vomiting.  No diarrhea.  No constipation. Genitourinary: Negative for dysuria. Musculoskeletal: Negative for back pain.  Positive for neck pain. Skin: Negative for rash. Neurological: Positive for headaches, negative for focal weakness or numbness.  ____________________________________________   PHYSICAL EXAM:  VITAL SIGNS: ED Triage Vitals [01/03/21 1350]  Enc Vitals Group     BP (!) 154/64     Pulse Rate (!) 59     Resp 16     Temp 98.4 F (36.9 C)      Temp Source Oral     SpO2 98 %     Weight      Height      Head Circumference      Peak Flow      Pain Score      Pain Loc      Pain Edu?      Excl. in [place]?     Constitutional: Alert and oriented. Eyes: Conjunctivae are normal. Head: 2 cm laceration to bridge of the nose, ecchymosis and edema extending to both periorbital areas with no bony step-offs.  Tenderness to palpation of the bridge of the nose noted, otherwise no facial bony tenderness. Nose: No congestion/rhinnorhea. Mouth/Throat: Mucous membranes are moist. Neck: Normal ROM, no midline cervical spine tenderness to palpation. Cardiovascular: Normal rate, regular  rhythm. Grossly normal heart sounds. Respiratory: Normal respiratory effort.  No retractions. Lungs CTAB. Gastrointestinal: Soft and nontender. No distention. Genitourinary: deferred Musculoskeletal: No lower extremity tenderness nor edema.  No upper extremity bony tenderness to palpation. Neurologic:  Normal speech and language. No gross focal neurologic deficits are appreciated. Skin:  Skin is warm, dry and intact. No rash noted. Psychiatric: Mood and affect are normal. Speech and behavior are normal.  ____________________________________________   LABS (all labs ordered are listed, but only abnormal results are displayed)  Labs Reviewed - No data to display   PROCEDURES  Procedure(s) performed (including Critical Care):  Marland KitchenMarland KitchenLaceration Repair  Date/Time: 01/03/2021 8:38 PM Performed by: Blake Divine, MD Authorized by: Blake Divine, MD   Consent:    Consent obtained:  Verbal   Consent given by:  Patient   Risks, benefits, and alternatives were discussed: yes     Risks discussed:  Infection, pain, retained foreign body, poor cosmetic result, need for additional repair and poor wound healing   Alternatives discussed:  Referral and no treatment Universal protocol:    Patient identity confirmed:  Verbally with patient and arm band Anesthesia:     Anesthesia method:  Local infiltration   Local anesthetic:  Lidocaine 1% w/o epi Laceration details:    Location:  Face   Face location:  Nose   Length (cm):  2 Pre-procedure details:    Preparation:  Patient was prepped and draped in usual sterile fashion and imaging obtained to evaluate for foreign bodies Exploration:    Limited defect created (wound extended): no     Hemostasis achieved with:  Direct pressure   Imaging obtained comment:  CT   Imaging outcome: foreign body not noted     Wound exploration: wound explored through full range of motion and entire depth of wound visualized     Wound extent: no areolar tissue violation noted, no fascia violation noted, no foreign bodies/material noted, no muscle damage noted, no nerve damage noted, no tendon damage noted, no underlying fracture noted and no vascular damage noted     Contaminated: no   Treatment:    Area cleansed with:  Saline   Amount of cleaning:  Standard   Irrigation solution:  Sterile saline   Irrigation method:  Pressure wash   Visualized foreign bodies/material removed: no     Debridement:  None   Undermining:  None   Scar revision: no   Skin repair:    Repair method:  Sutures   Suture size:  5-0   Suture material:  Nylon   Suture technique:  Simple interrupted   Number of sutures:  2 Approximation:    Approximation:  Loose Repair type:    Repair type:  Simple Post-procedure details:    Dressing:  Open (no dressing)   Procedure completion:  Tolerated well, no immediate complications   ____________________________________________   INITIAL IMPRESSION / ASSESSMENT AND PLAN / ED COURSE      80 year old female with past medical history of hyperlipidemia, CAD status post CABG, and hypothyroidism who presents to the ED complaining of headache, nasal pain, and neck pain following trip and fall on a dog gate.  She hit her head and lost consciousness for a few seconds, but does not take any blood thinners and  has no focal neurologic deficits on exam.  CT head and cervical spine are negative for acute process, CT maxillofacial shows minimally displaced nasal bone fracture and she is also noted to have overlying laceration.  No  evidence of traumatic injury to her trunk or extremities.  Laceration was irrigated with normal saline and repaired without difficulty, we will start patient on Keflex for prophylaxis against infection.  She is appropriate for discharge home with ENT follow-up as needed, was counseled to return to the ED for new worsening symptoms and to have sutures removed in 7 days.  Patient agrees with plan.      ____________________________________________   FINAL CLINICAL IMPRESSION(S) / ED DIAGNOSES  Final diagnoses:  Fall, initial encounter  Open fracture of nasal bone, initial encounter  Laceration of nose, initial encounter     ED Discharge Orders          Ordered    cephALEXin (KEFLEX) 500 MG capsule  4 times daily        01/03/21 2033             Note:  This document was prepared using Dragon voice recognition software and may include unintentional dictation errors.    Blake Divine, MD 01/03/21 2043

## 2021-01-03 NOTE — ED Triage Notes (Signed)
Pt comes into the ED via EMS from home with c/o tripping and falling today stepping over a gate, fell face first, lac to the nose and swelling to the eye, possible LOC, no blood thinners, some neck pain. a/ox3  56HR 185/84 99%RA [id number] 98.2 temp

## 2021-01-09 DIAGNOSIS — S0031XA Abrasion of nose, initial encounter: Secondary | ICD-10-CM | POA: Diagnosis not present

## 2021-02-14 ENCOUNTER — Emergency Department: Payer: PPO

## 2021-02-14 ENCOUNTER — Other Ambulatory Visit: Payer: Self-pay

## 2021-02-14 ENCOUNTER — Emergency Department
Admission: EM | Admit: 2021-02-14 | Discharge: 2021-02-14 | Disposition: A | Discharge status: Home / Self Care | Payer: PPO | Attending: Emergency Medicine | Admitting: Emergency Medicine

## 2021-02-14 DIAGNOSIS — W01198A Fall on same level from slipping, tripping and stumbling with subsequent striking against other object, initial encounter: Secondary | ICD-10-CM | POA: Diagnosis not present

## 2021-02-14 DIAGNOSIS — Z8541 Personal history of malignant neoplasm of cervix uteri: Secondary | ICD-10-CM | POA: Insufficient documentation

## 2021-02-14 DIAGNOSIS — M25512 Pain in left shoulder: Secondary | ICD-10-CM | POA: Diagnosis present

## 2021-02-14 DIAGNOSIS — I2510 Atherosclerotic heart disease of native coronary artery without angina pectoris: Secondary | ICD-10-CM | POA: Diagnosis not present

## 2021-02-14 DIAGNOSIS — S199XXA Unspecified injury of neck, initial encounter: Secondary | ICD-10-CM | POA: Diagnosis not present

## 2021-02-14 DIAGNOSIS — Y9301 Activity, walking, marching and hiking: Secondary | ICD-10-CM | POA: Diagnosis not present

## 2021-02-14 DIAGNOSIS — S0990XA Unspecified injury of head, initial encounter: Secondary | ICD-10-CM | POA: Insufficient documentation

## 2021-02-14 DIAGNOSIS — E039 Hypothyroidism, unspecified: Secondary | ICD-10-CM | POA: Insufficient documentation

## 2021-02-14 DIAGNOSIS — Z7982 Long term (current) use of aspirin: Secondary | ICD-10-CM | POA: Diagnosis not present

## 2021-02-14 DIAGNOSIS — M25562 Pain in left knee: Secondary | ICD-10-CM | POA: Insufficient documentation

## 2021-02-14 DIAGNOSIS — Z79899 Other long term (current) drug therapy: Secondary | ICD-10-CM | POA: Insufficient documentation

## 2021-02-14 MED ORDER — OXYCODONE-ACETAMINOPHEN 5-325 MG PO TABS
1.0000 | ORAL_TABLET | Freq: Once | ORAL | Status: AC
  Administered 2021-02-14: 21:00:00 1 via ORAL
  Filled 2021-02-14: qty 1

## 2021-02-14 MED ORDER — POLYETHYLENE GLYCOL 3350 17 G PO PACK: 17.0000 g | PACK | Freq: Every day | ORAL | 0 refills | Status: DC

## 2021-02-14 MED ORDER — OXYCODONE HCL 5 MG PO TABS: 5.0000 mg | ORAL_TABLET | Freq: Three times a day (TID) | ORAL | 0 refills | Status: AC | PRN

## 2021-02-14 MED ORDER — FENTANYL CITRATE PF 50 MCG/ML IJ SOSY
50.0000 ug | PREFILLED_SYRINGE | Freq: Once | INTRAMUSCULAR | Status: AC
  Administered 2021-02-14: 14:00:00 50 ug via INTRAVENOUS
  Filled 2021-02-14: qty 1

## 2021-02-14 NOTE — ED Triage Notes (Signed)
Having pain to left shoulder s/p fall

## 2021-02-14 NOTE — ED Notes (Signed)
Pt verbalized understanding of discharge.

## 2021-02-14 NOTE — ED Triage Notes (Signed)
Pt to ED for fall d/t trip today. Reports pain to left shoulder and left knee.  Deformity noted to left shoulder. Unable to move left arm. Pt appears very uncomfortable.

## 2021-02-14 NOTE — Discharge Instructions (Signed)
As we discussed please talk your arm out of the sling and work on moving the shoulder joint at least once an hour. Please give orthopedics a call tomorrow to arrange follow up. Please seek medical attention for any high fevers, chest pain, shortness of breath, change in behavior, persistent vomiting, bloody stool or any other new or concerning symptoms.

## 2021-02-14 NOTE — ED Provider Notes (Signed)
Emergency Medicine Provider Triage Evaluation Note  Bonnie Hancock , a 80 y.o. female  was evaluated in triage.  Pt complains of left shoulder pain, left knee pain, and neck pain after mechanical, non-syncopal fall this morning. Has been unable to move left arm since.  Review of Systems  Positive: Shoulder, knee, neck pain Negative: Loss of consciousness  Physical Exam  LMP  (LMP Unknown)  Gen:   Awake, no distress   Resp:  Normal effort  MSK:   Moves extremities without difficulty  Other:    Medical Decision Making  Medically screening exam initiated at 2:17 PM.  Appropriate orders placed.  Bonnie Hancock was informed that the remainder of the evaluation will be completed by another provider, this initial triage assessment does not replace that evaluation, and the importance of remaining in the ED until their evaluation is complete.    Victorino Dike, FNP 02/14/21 1420    Merlyn Lot, MD 02/14/21 1426

## 2021-02-14 NOTE — ED Provider Notes (Signed)
[facility] Emergency Department Provider Note  ____________________________________________   I have reviewed the triage vital signs and the nursing notes.   HISTORY  Chief Complaint Fall   History limited by: Not Limited   HPI Bonnie Hancock is a 80 y.o. female who presents to the emergency department today because of concern for pain after a fall. The patient states she was walking when she tripped over a gate in her house. Landed on her left side. Thinks she might have hit her head but no LOC. The patient has primary complaint of left shoulder pain. Also complaining of some left knee pain and neck pain. Has been able to walk since the fall but has limited ROM of left shoulder.   Records reviewed. Per medical record review patient has a history of HLD, HTN.   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   CAD, multiple vessel 08/27/2019   NSTEMI (non-ST elevated myocardial infarction) ([place]) 08/26/2019   Anxiety 08/26/2019   Hypothyroidism 08/26/2019   Pure hypercholesterolemia    Arthritis 07/23/2016   Morbid obesity due to excess calories ([place]) 07/21/2015   Weakness 07/21/2015   Heartburn 07/20/2013   Back pain 07/20/2013   SOB (shortness of breath) 08/29/2010   Obesity 08/29/2010   Hyperlipidemia LDL goal <70 08/29/2010    Past Surgical History:  Procedure Laterality Date   APPENDECTOMY     CATARACT EXTRACTION W/PHACO Left 09/17/2017   Procedure: CATARACT EXTRACTION PHACO AND INTRAOCULAR LENS PLACEMENT ([place]);   Surgeon: Birder Robson, MD;  Location: [facility] ORS;  Service: Ophthalmology;  Laterality: Left;  [place] 00:48 AP% 13.0 CDE 6.26 Fluid pack lot # [id number]   CATARACT EXTRACTION W/PHACO Right 10/15/2017   Procedure: CATARACT EXTRACTION PHACO AND INTRAOCULAR LENS PLACEMENT (IOC);  Surgeon: Birder Robson, MD;  Location: [facility] ORS;  Service: Ophthalmology;  Laterality: Right;  [place]  00:44 AP% 16.1 CDE 7.23 Fluid pack lot # [id number]   CHOLECYSTECTOMY     CORONARY ARTERY BYPASS GRAFT N/A 09/04/2019   Procedure: CORONARY ARTERY BYPASS GRAFTING (CABG), ON PUMP, TIMES THREE, LIMA TO LAD, SVG TO PDA, SVG TO OM 2;  Surgeon: Ivin Poot, MD;  Location: [place];  Service: Open Heart Surgery;  Laterality: N/A;   ENDOVEIN HARVEST OF GREATER SAPHENOUS VEIN Right 09/04/2019   Procedure: ENDOVEIN HARVEST OF GREATER SAPHENOUS VEIN;  Surgeon: Ivin Poot, MD;  Location: [place];  Service: Open Heart Surgery;  Laterality: Right;   LEFT HEART CATH AND CORONARY ANGIOGRAPHY N/A 08/27/2019   Procedure: LEFT HEART CATH AND CORONARY ANGIOGRAPHY;  Surgeon: Minna Merritts, MD;  Location: [place] CV LAB;  Service: Cardiovascular;  Laterality: N/A;   TEE WITHOUT CARDIOVERSION N/A 09/04/2019   Procedure: TRANSESOPHAGEAL ECHOCARDIOGRAM (TEE);  Surgeon: Prescott Gum, Collier Salina, MD;  Location: [place];  Service: Open Heart Surgery;  Laterality: N/A;    Prior to Admission medications   Medication Sig Start Date End  Date Taking? Authorizing Provider  acetaminophen (TYLENOL) 500 MG tablet Take 500 mg by mouth every 6 (six) hours as needed for moderate pain or headache.    [provider]  aspirin EC 81 MG tablet Take 1 tablet (81 mg total) by mouth daily. Swallow whole. 10/02/19   Dunn, Areta Haber, PA-C  escitalopram (LEXAPRO) 10 MG tablet Take 1.5 mg by mouth daily.    [provider]  ezetimibe (ZETIA) 10 MG tablet Take 1 tablet (10 mg total) by mouth daily. 11/01/20   Marrianne Mood D, PA-C  levothyroxine  (SYNTHROID) 50 MCG tablet Take 50 mcg by mouth daily before breakfast.    [provider]  nitroGLYCERIN (NITROSTAT) 0.4 MG SL tablet Place 1 tablet (0.4 mg total) under the tongue every 5 (five) minutes x 3 doses as needed for chest pain. 11/01/20   Marrianne Mood D, PA-C  NON FORMULARY 300 mg daily. CBD GUMMIES    [provider]  rosuvastatin (CRESTOR) 10 MG tablet Take 1 tablet (10 mg total) by mouth daily. 12/06/20   Minna Merritts, MD  Thiamine HCl (VITAMIN B-1) 250 MG tablet Take 250 mg by mouth daily.    [provider]    Allergies Statins  Family History  Problem Relation Age of Onset   Heart attack Father    Emphysema Mother    Heart disease Mother     Social History Social History   Tobacco Use   Smoking status: Never   Smokeless tobacco: Never  Vaping Use   Vaping Use: Never used  Substance Use Topics   Alcohol use: Not Currently   Drug use: No    Review of Systems Constitutional: No fever/chills Eyes: No visual changes. ENT: No sore throat. Cardiovascular: Denies chest pain. Respiratory: Denies shortness of breath. Gastrointestinal: No abdominal pain.  No nausea, no vomiting.  No diarrhea.   Genitourinary: Negative for dysuria. Musculoskeletal: Positive for neck pain, left shoulder pain, left knee pain. Skin: Negative for rash. Neurological: Negative for headaches, focal weakness or numbness.  ____________________________________________   PHYSICAL EXAM:  VITAL SIGNS: ED Triage Vitals  Enc Vitals Group     BP 02/14/21 1417 (!) 150/109     Pulse Rate 02/14/21 1419 71     Resp 02/14/21 1417 20     Temp 02/14/21 1419 98.4 F (36.9 C)     Temp src --      SpO2 02/14/21 1419 98 %     Weight 02/14/21 1418 170 lb (77.1 kg)     Height 02/14/21 1418 5\' 2"  (1.575 m)     Head Circumference --      Peak Flow --      Pain Score 02/14/21 1417 10   Constitutional: Alert and oriented.  Eyes: Conjunctivae are normal.  ENT       Head: Normocephalic and atraumatic.      Nose: No congestion/rhinnorhea.      Mouth/Throat: Mucous membranes are moist.      Neck: No stridor. Hematological/Lymphatic/Immunilogical: No cervical lymphadenopathy. Cardiovascular: Normal rate, regular rhythm.  No murmurs, rubs, or gallops.  Respiratory: Normal respiratory effort without tachypnea nor retractions. Breath sounds are clear and equal bilaterally. No wheezes/rales/rhonchi. Gastrointestinal: Soft and non tender. No rebound. No guarding.  Genitourinary: Deferred Musculoskeletal: Slight swelling to left shoulder, no deformity. RP 2+ distally, sensation intact. Left knee with small amount of bruising and abrasion.  Neurologic:  Normal speech and language. No gross focal neurologic deficits are appreciated.  Skin:  Skin is warm, dry and intact. No rash noted. Psychiatric: Mood and affect are normal. Speech and behavior are normal. Patient exhibits appropriate insight and judgment.  ____________________________________________    LABS (pertinent positives/negatives)  None  ____________________________________________   EKG  None  ____________________________________________    RADIOLOGY  CT cervical spine No acute abnormality  CT head No acute abnormality  Left shoulder No acute abnormality  Left knee No acute abnormality  ____________________________________________   PROCEDURES  Procedures  ____________________________________________   INITIAL IMPRESSION / ASSESSMENT AND PLAN / ED COURSE  Pertinent labs & imaging results that were available during my care of the patient were reviewed by me and considered in my medical decision making (see chart for details).   Patient presented to the emergency department today after mechanical fall. Imaging without any concerning abnormalities. Patient did have primary complaint of pain to left shoulder. Felt better after being placed in sling and medication. NV  intact distally. At this time will plan on discharging home to follow up with orthopedics. Discussed importance of taking arm out of sling to work on ROM.  ____________________________________________   FINAL CLINICAL IMPRESSION(S) / ED DIAGNOSES  Final diagnoses:  Acute pain of left shoulder     Note: This dictation was prepared with Dragon dictation. Any transcriptional errors that result from this process are unintentional     Nance Pear, MD 02/14/21 2304

## 2021-02-24 ENCOUNTER — Telehealth: Payer: Self-pay | Admitting: Pharmacist

## 2021-02-24 DIAGNOSIS — I25118 Atherosclerotic heart disease of native coronary artery with other forms of angina pectoris: Secondary | ICD-10-CM

## 2021-02-24 MED ORDER — ROSUVASTATIN CALCIUM 40 MG PO TABS: 40.0000 mg | ORAL_TABLET | Freq: Every day | ORAL | 3 refills | Status: DC

## 2021-02-24 NOTE — Telephone Encounter (Signed)
Called pt to follow up with lipids. I increased her rosuvastatin from 10mg  to 40mg  when I saw her on 11/24/20 and continued her on ezetimibe 10mg  daily.  Looks like med list was changed and rx was sent in for her old lower dose of 10mg  when she saw cardiology on 10/18, unclear why. Confirmed pt has a 10mg  dose at home.  Will again increase to 40mg  daily. Pt and husband are aware.

## 2021-04-02 IMAGING — DX DG CHEST 1V PORT
1 series · 1 of 1 positions shown · non-contrast
Comparison: PA and lateral chest 04/14/2015.

CLINICAL DATA: Fatigue. The patient had an episode of chest pain
06/19/2019.

EXAM:
PORTABLE CHEST 1 VIEW

[chest ap]
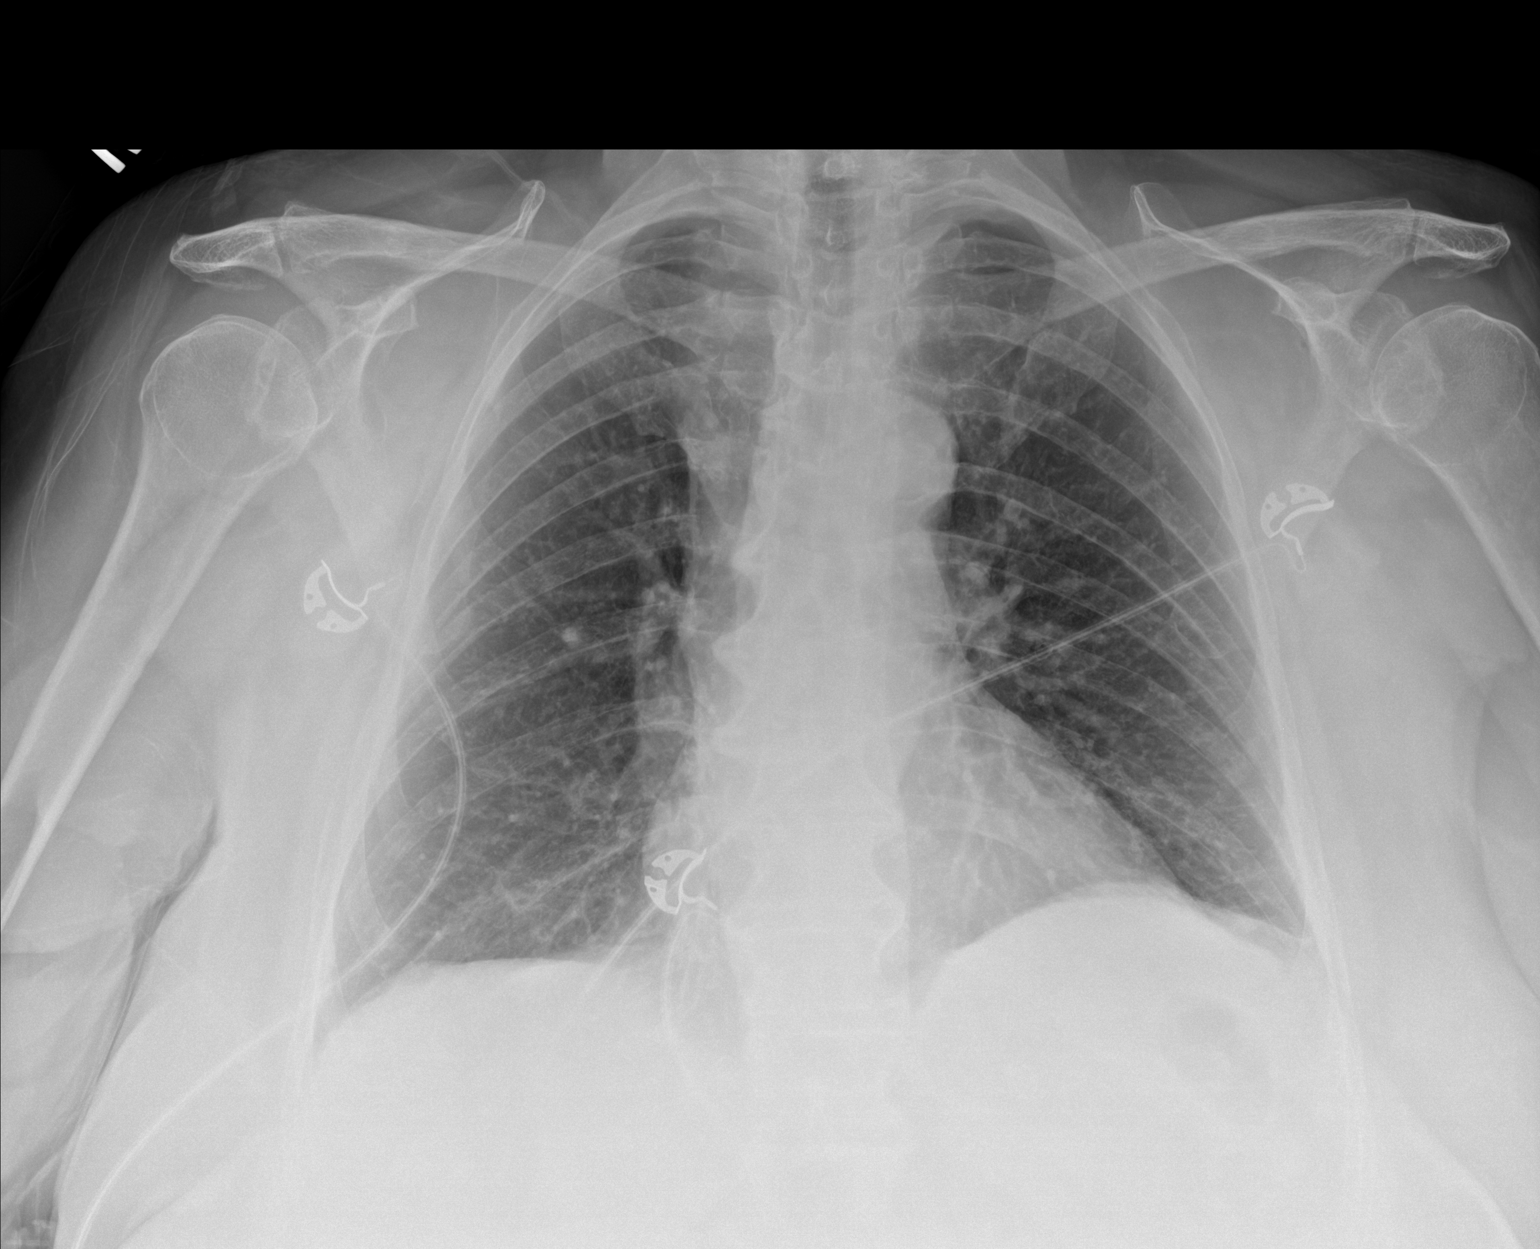

[1 of 1 positions shown; findings below may reference images not displayed]

FINDINGS: Lungs clear. Heart size normal. Aortic atherosclerosis. No
pneumothorax or pleural fluid. No acute or focal bony abnormality.
IMPRESSION: No acute disease.

Aortic Atherosclerosis ([id number]).

## 2021-04-25 NOTE — Addendum Note (Signed)
Addended by: Rickesha Veracruz E on: 04/25/2021 11:19 AM ? ? Modules accepted: Orders ? ?

## 2021-04-25 NOTE — Telephone Encounter (Addendum)
Called pt, scheduled labs for her to have done in [place] to assess lipids and LFTs on higher dose of rosuvastatin '40mg'$  daily. She is aware to be fasting for labs. ?

## 2021-04-30 ENCOUNTER — Other Ambulatory Visit: Payer: Self-pay | Admitting: Physician Assistant

## 2021-05-02 ENCOUNTER — Other Ambulatory Visit: Payer: Self-pay | Admitting: Physician Assistant

## 2021-05-04 ENCOUNTER — Other Ambulatory Visit: Payer: Self-pay | Admitting: Physician Assistant

## 2021-05-05 ENCOUNTER — Telehealth: Payer: Self-pay | Admitting: Cardiovascular Disease

## 2021-05-05 NOTE — Telephone Encounter (Signed)
Patient calling to see if needs to continue taking Clopidogrel '75mg'$  if so refill needed ?

## 2021-05-05 NOTE — Telephone Encounter (Signed)
Per ov note with Elenor Quinones 11/01/20 - She is no longer on Plavix secondary to rash.  ? ?Most recent ov note 12/06/20 states - Rash on Plavix, this was held ? ?Pt is now asking if she is to continue Plavix 75 mg and if so requesting refills (90 day to [facility] [place]).  ? ?Called and spoke with pt and her husband. Husband confirms that pt has been taking Plavix 75 mg daily. Denies rash and pt does not seem to recall history of rash with Plavix.  ? ?Notified pt that I will need to confirm with Dr. Rockey Situ prior to sending refills. Medication is no longer on pt's med list.  ? ?Pt voiced understanding and understands we will call back with Dr. Rockey Situ recc.  ?

## 2021-05-15 NOTE — Telephone Encounter (Signed)
Okay to hold Plavix, stay on aspirin 81 daily  ?Thx  ?TG   ?Called patient and went over MDs reccs. Pt verbalized understanding and voiced appreciation for the call.  ?

## 2021-05-31 ENCOUNTER — Telehealth: Payer: Self-pay | Admitting: Pharmacist

## 2021-05-31 NOTE — Telephone Encounter (Signed)
Called pt and reminded her to have labs checked at [facility] in [place] to recheck lipids and LFTs on rosuvastatin '40mg'$  daily and ezetimibe '10mg'$  daily. ?

## 2021-06-05 ENCOUNTER — Other Ambulatory Visit: Payer: Self-pay | Admitting: *Deleted

## 2021-06-05 DIAGNOSIS — I25118 Atherosclerotic heart disease of native coronary artery with other forms of angina pectoris: Secondary | ICD-10-CM

## 2021-06-05 DIAGNOSIS — E781 Pure hyperglyceridemia: Secondary | ICD-10-CM

## 2021-06-05 DIAGNOSIS — E785 Hyperlipidemia, unspecified: Secondary | ICD-10-CM

## 2021-06-07 ENCOUNTER — Other Ambulatory Visit
Admission: RE | Admit: 2021-06-07 | Discharge: 2021-06-07 | Disposition: A | Discharge status: Home / Self Care | Payer: PPO | Attending: Cardiovascular Disease | Admitting: Cardiovascular Disease

## 2021-06-07 ENCOUNTER — Encounter: Payer: Self-pay | Admitting: Emergency Medicine

## 2021-06-07 ENCOUNTER — Telehealth: Payer: Self-pay | Admitting: Emergency Medicine

## 2021-06-07 DIAGNOSIS — E785 Hyperlipidemia, unspecified: Secondary | ICD-10-CM | POA: Diagnosis present

## 2021-06-07 DIAGNOSIS — E781 Pure hyperglyceridemia: Secondary | ICD-10-CM | POA: Insufficient documentation

## 2021-06-07 DIAGNOSIS — I25118 Atherosclerotic heart disease of native coronary artery with other forms of angina pectoris: Secondary | ICD-10-CM | POA: Insufficient documentation

## 2021-06-07 LAB — HEPATIC FUNCTION PANEL
ALT: 35 U/L (ref 0–44)
AST: 44 U/L — ABNORMAL HIGH (ref 15–41)
Albumin: 3.6 g/dL (ref 3.5–5.0)
Alkaline Phosphatase: 46 U/L (ref 38–126)
Bilirubin, Direct: <0.1 mg/dL (ref 0.0–0.2)
Total Bilirubin: 0.6 mg/dL (ref 0.3–1.2)
Total Protein: 6.8 g/dL (ref 6.5–8.1)

## 2021-06-07 LAB — LIPID PANEL
Cholesterol: 137 mg/dL (ref 0–200)
HDL: 56 mg/dL (ref >40)
LDL Cholesterol: 59 mg/dL (ref 0–99)
Total CHOL/HDL Ratio: 2.4 RATIO
Triglycerides: 109 mg/dL (ref <150)
VLDL: 22 mg/dL (ref 0–40)

## 2021-06-07 NOTE — Telephone Encounter (Signed)
-----   Message from Minna Merritts, MD sent at 06/07/2021 10:34 AM EDT ----- ?Lab work reviewed ?Excellent cholesterol level, normal liver function ?

## 2021-06-07 NOTE — Progress Notes (Signed)
Results letter.

## 2021-06-07 NOTE — Telephone Encounter (Signed)
Entered in error

## 2021-06-16 IMAGING — DX DG CHEST 1V PORT
1 series · 1 of 1 positions shown · non-contrast
Comparison: 09/04/2019

CLINICAL DATA: Post CABG.

EXAM:
PORTABLE CHEST 1 VIEW

[chest ap]
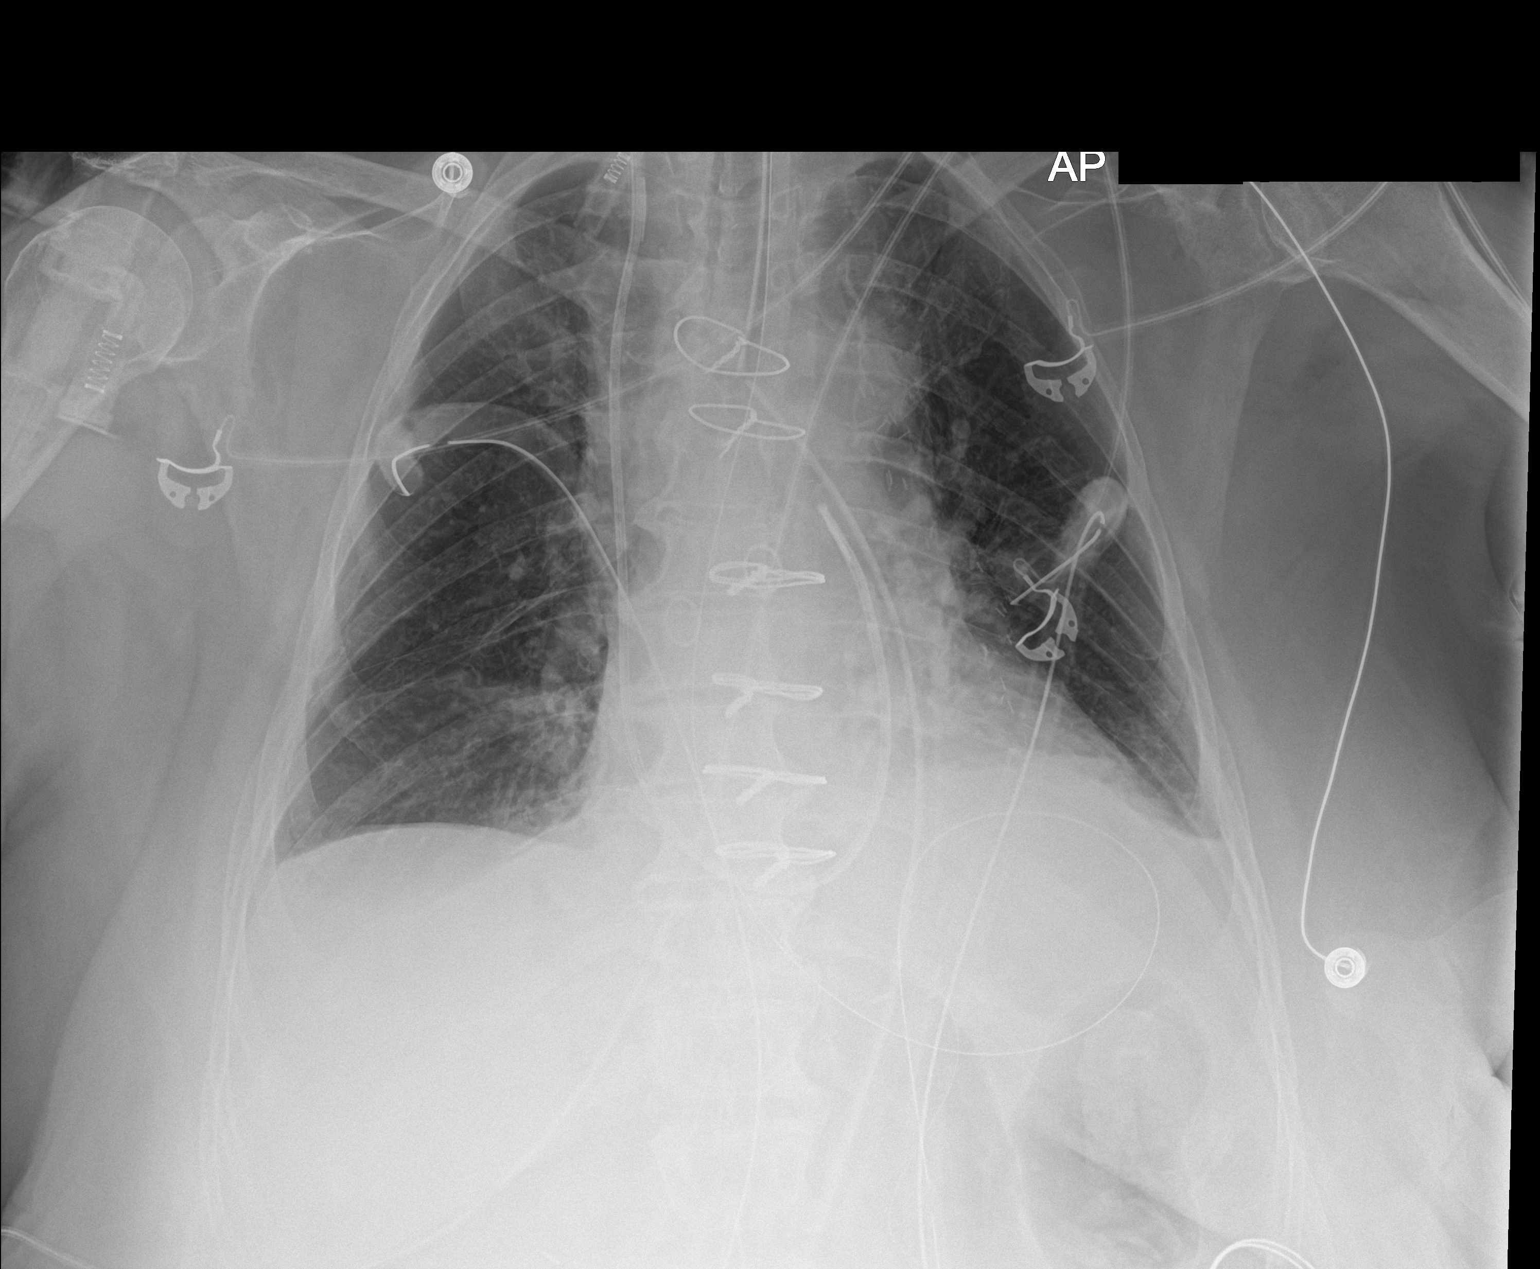

[1 of 1 positions shown; findings below may reference images not displayed]

FINDINGS: Carina is difficult to visualize but the endotracheal tube appears
to be at least 1.7 cm above the carina. Right jugular central line
with a pulmonary artery catheter. Pulmonary catheter is in stable
position in the distal main pulmonary artery or proximal right
pulmonary artery. Nasogastric tube is coiled in the stomach.
Evidence for 3 chest drains and negative for pneumothorax.
Persistent densities at the left lung base are most compatible with
atelectasis. Heart size is stable. Postsurgical changes compatible
with CABG procedure.
IMPRESSION: 1. No significant change in the support apparatuses. Negative for
pneumothorax.
2. Left basilar densities are most compatible with atelectasis.

## 2021-06-18 IMAGING — DX DG CHEST 1V PORT
1 series · 1 of 1 positions shown · non-contrast
Comparison: 09/06/2019

CLINICAL DATA: Atelectasis.  CABG.

EXAM:
PORTABLE CHEST 1 VIEW

[chest ap]
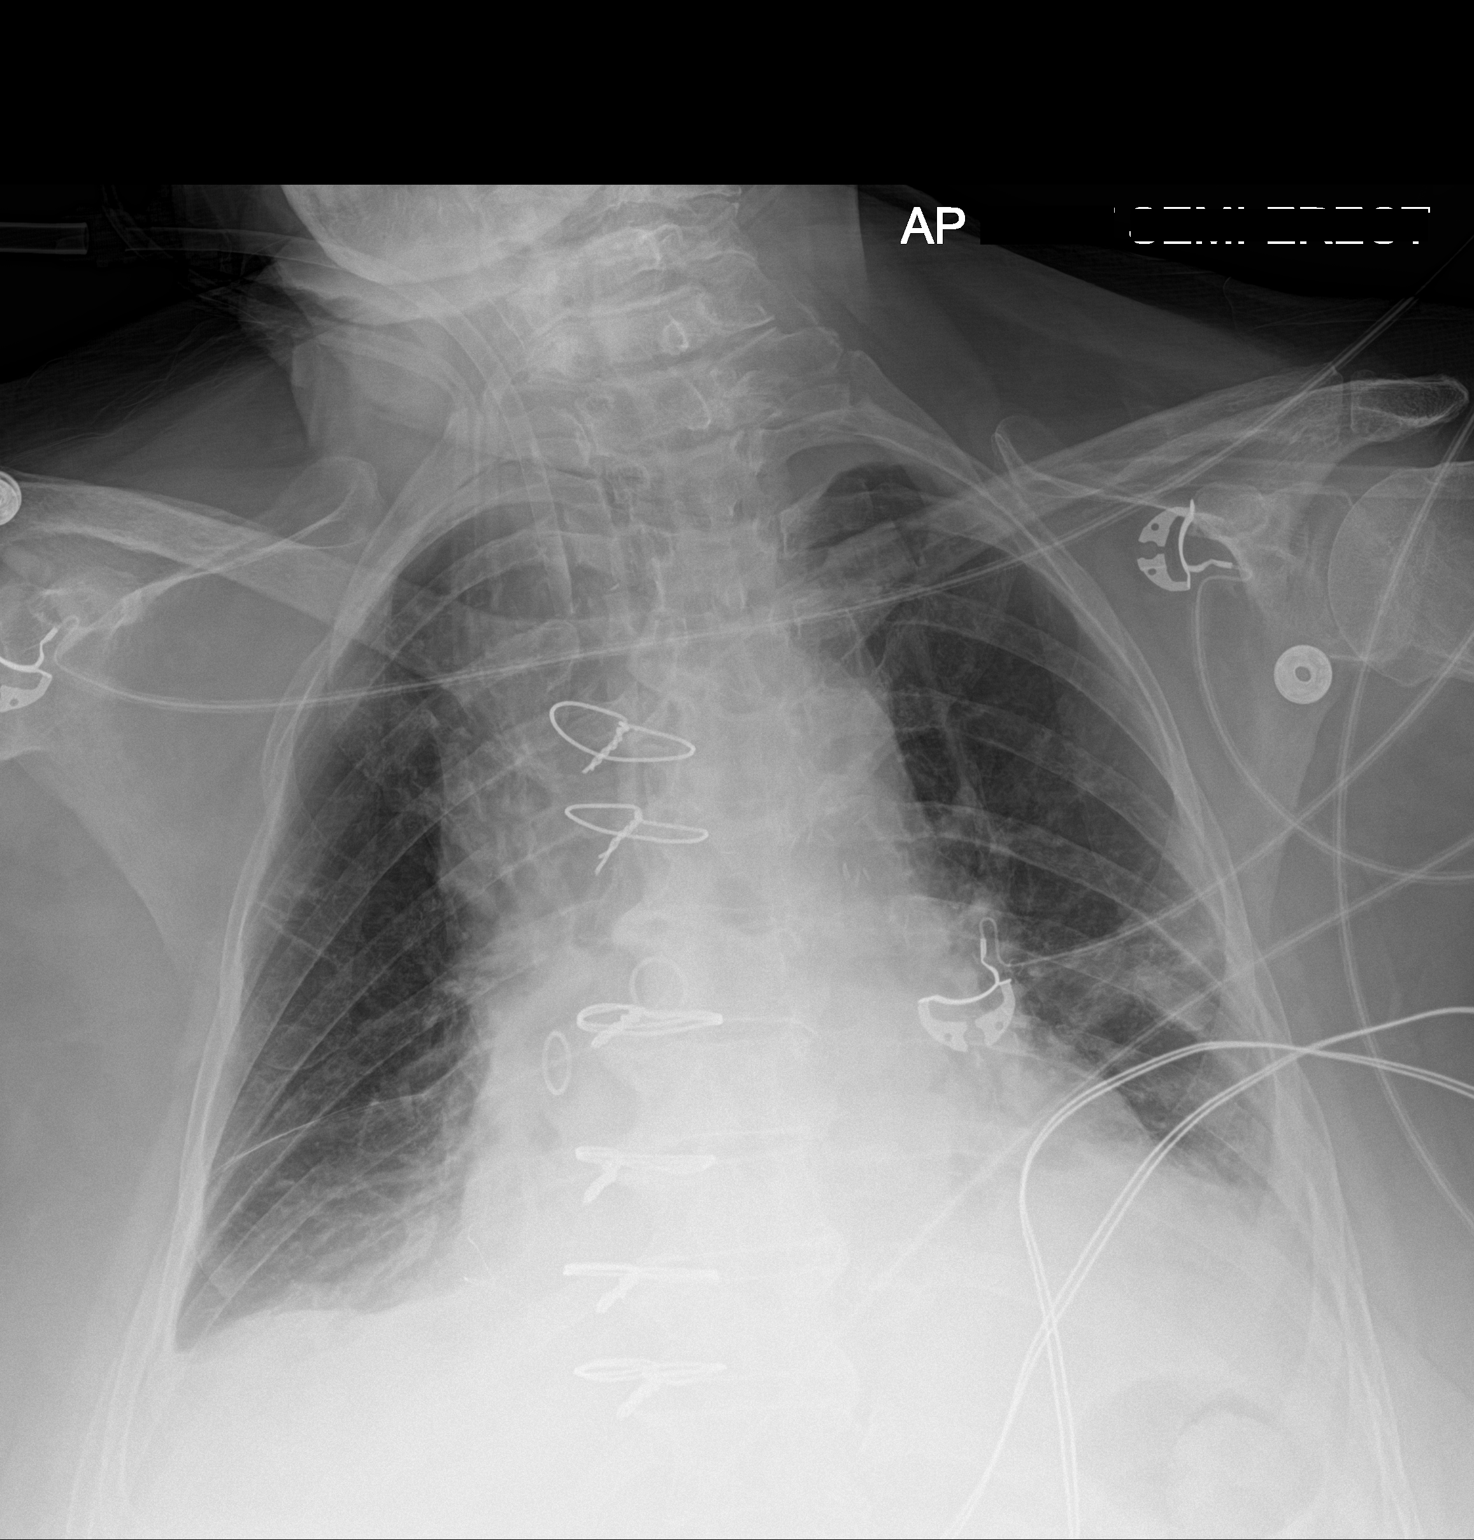

[1 of 1 positions shown; findings below may reference images not displayed]

FINDINGS: Sequelae of CABG are again identified. The patient is rotated to the
right. A right jugular sheath remains in place. Bilateral chest
tubes and the mediastinal drain have been removed. The cardiac
silhouette remains enlarged. Mild bibasilar opacities have
increased, and there may be small bilateral pleural effusions. No
pneumothorax is identified.
IMPRESSION: 1. Interval removal of chest tubes and mediastinal drain. No
pneumothorax.
2. Increased bibasilar opacities likely reflecting atelectasis and
possibly small pleural effusions.

## 2021-06-20 IMAGING — DX DG CHEST 2V
2 series · 2 of 2 positions shown · non-contrast
Comparison: September 08, 2019.

CLINICAL DATA: Status post coronary bypass graft.

EXAM:
CHEST - 2 VIEW

[w chest pa]
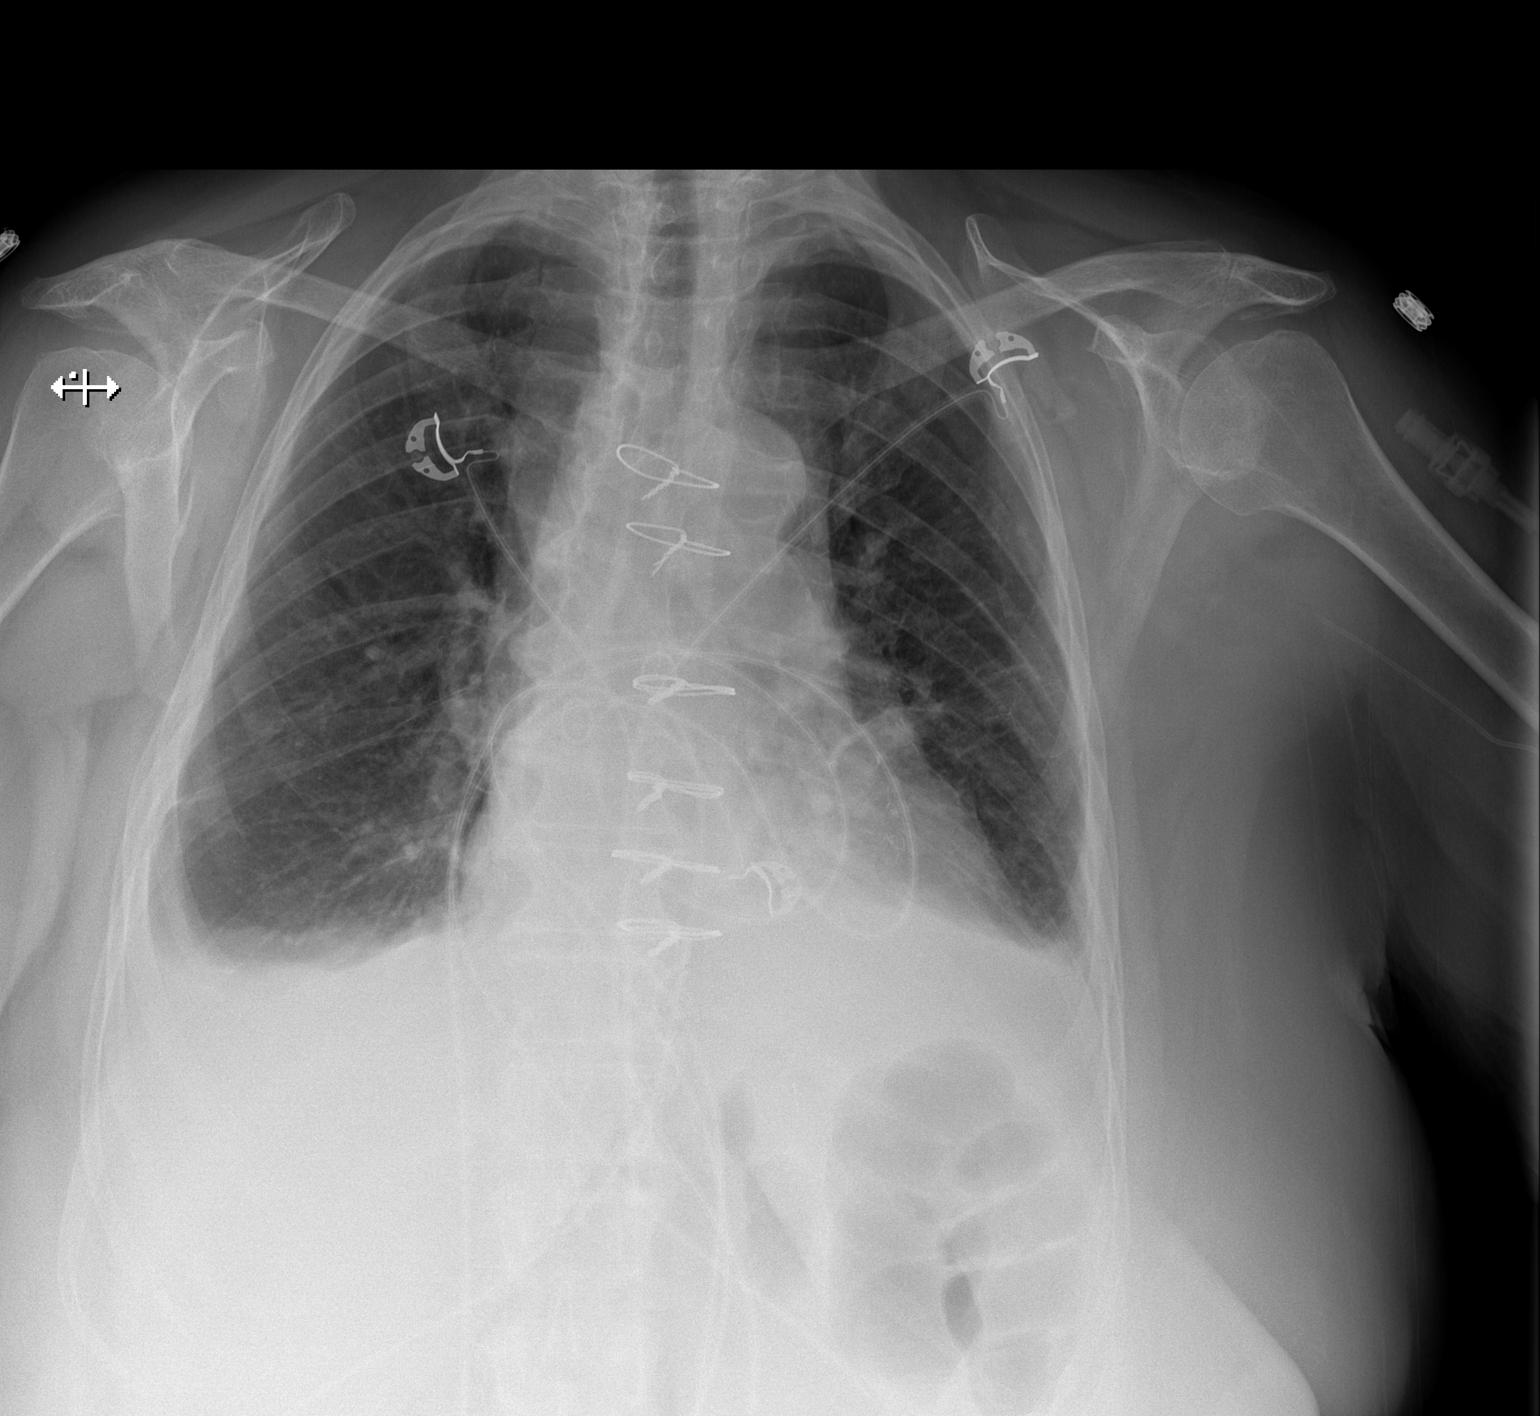

[w chest lat]
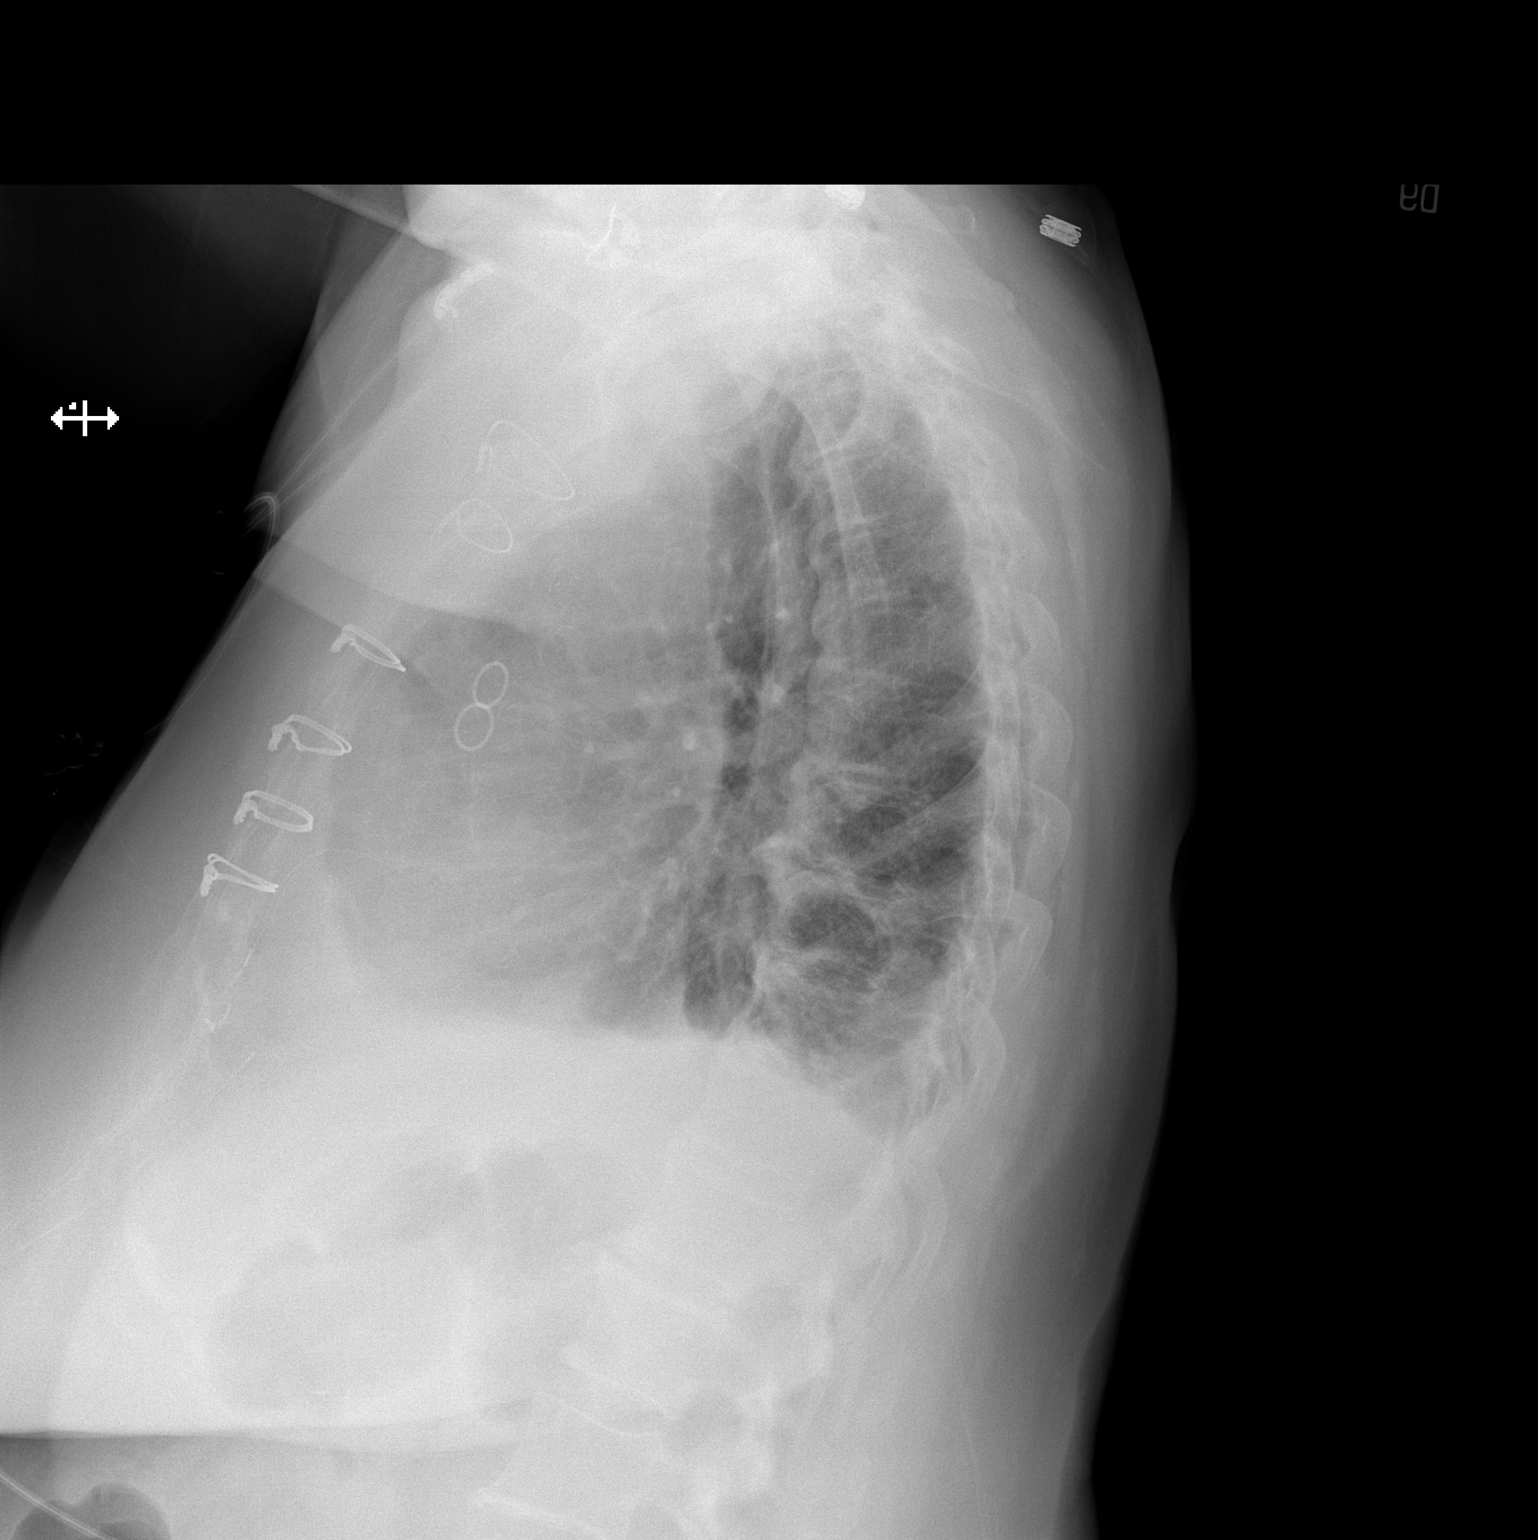

[2 of 2 positions shown; findings below may reference images not displayed]

FINDINGS: Status post coronary bypass graft. No pneumothorax is noted. Stable
cardiomediastinal silhouette. Stable small bilateral pleural
effusions with associated subsegmental atelectasis. Bony thorax is
unremarkable.
IMPRESSION: Stable small bilateral pleural effusions with associated
subsegmental atelectasis.

## 2021-08-16 ENCOUNTER — Other Ambulatory Visit: Payer: Self-pay

## 2021-08-16 MED ORDER — EZETIMIBE 10 MG PO TABS: 10.0000 mg | ORAL_TABLET | Freq: Every day | ORAL | 0 refills | Status: DC

## 2021-11-13 ENCOUNTER — Other Ambulatory Visit: Payer: Self-pay | Admitting: Cardiovascular Disease

## 2021-11-13 NOTE — Telephone Encounter (Signed)
Please schedule 12 month F/U appt for 90 day refills. Thank you! 

## 2021-11-15 NOTE — Telephone Encounter (Signed)
Rx refill sent to pharmacy. 

## 2021-12-20 ENCOUNTER — Telehealth: Payer: Self-pay | Admitting: Cardiovascular Disease

## 2021-12-20 MED ORDER — EZETIMIBE 10 MG PO TABS: 10.0000 mg | ORAL_TABLET | Freq: Every day | ORAL | 1 refills | Status: DC

## 2021-12-20 NOTE — Telephone Encounter (Signed)
*  STAT* If patient is at the pharmacy, call can be transferred to refill team.   1. Which medications need to be refilled? (please list name of each medication and dose if known) ezetimibe (ZETIA) 10 MG tablet  2. Which pharmacy/location (including street and city if local pharmacy) is medication to be sent to? [place] ([address]), [place] - [address]  3. Do they need a 30 day or 90 day supply? 90   Patient has appt on 12/26

## 2022-02-05 ENCOUNTER — Encounter: Payer: Self-pay | Admitting: *Deleted

## 2022-02-12 ENCOUNTER — Other Ambulatory Visit: Payer: Self-pay | Admitting: Cardiovascular Disease

## 2022-02-13 ENCOUNTER — Ambulatory Visit: Payer: PPO | Admitting: Cardiovascular Disease

## 2022-03-06 ENCOUNTER — Other Ambulatory Visit: Payer: Self-pay | Admitting: Cardiovascular Disease

## 2022-03-06 NOTE — Telephone Encounter (Signed)
Disp Refills Start End   rosuvastatin (CRESTOR) 40 MG tablet 90 tablet 3 02/24/2021    Sig - Route: Take 1 tablet (40 mg total) by mouth daily. - Oral   Sent to pharmacy as: rosuvastatin (CRESTOR) 40 MG tablet   Notes to Pharmacy: Dose change again   E-Prescribing Status: Receipt confirmed by pharmacy (02/24/2021  9:56 AM EST)

## 2022-03-19 NOTE — Progress Notes (Deleted)
Patient ID: Bonnie Hancock, female   DOB: [birth year], 82 y.o.   MRN: [id number] Cardiology Office Note  Date:  03/19/2022   ID:  Bonnie Hancock, DOB May 15, 1940, MRN [id number]  PCP:  Albina Billet, MD   No chief complaint on file.   HPI:  Bonnie Hancock is a 82 year-old woman,  With history of  Chronic diastolic CHF obesity,  Chronic shortness of breath with exertion Non-STEMI July 2021 troponin 8900 Who presents for routine follow-up of her coronary disease, S/P CABG, chronic shortness of breath  Last seen by myself in clinic October 2022  Presents today with her husband Last seen in clinic by myself March 2022 Rash on Plavix, this was held  Echo reviewed with her on today's visit Normal EF Mild to moderate MR  Not to lasix "I am doing a lot of peeing" Denies any leg swelling, no abdominal distention, feels her weight is stable  Goes shopping with her sister, otherwise no other exercise  Recently seen by pharmacy, encouraged to stay on her Crestor 10 with Zetia But not interested in PCSK9 inhibitor  Of past medical history reviewed  hospital with non-STEMI July 2021,  cardiac catheterization July 2021 Three-vessel coronary disease There is distal left main, ostial LAD, mid left circumflex, mid RCA disease, ostial/proximal OM disease Mildly depressed ejection fraction 45 to 50% with inferior wall hypokinesis  Underwent CABG September 04, 2019 CORONARY ARTERY BYPASS GRAFTING (CABG), ON PUMP, TIMES THREE, LIMA TO LAD, SVG TO PDA, SVG TO OM 2;   Notes reviewed from cardiothoracic surgery visit November 2021   PMH:   has a past medical history of Anxiety, Arthritis, Cancer ([place]), Coronary artery disease, Depression, Dyspnea, Heart murmur, HOH (hard of hearing), Hyperlipidemia, Hypothyroidism, and Non-STEMI (non-ST elevated myocardial infarction) ([place]).  PSH:    Past Surgical History:  Procedure Laterality Date   APPENDECTOMY     CATARACT EXTRACTION W/PHACO Left 09/17/2017    Procedure: CATARACT EXTRACTION PHACO AND INTRAOCULAR LENS PLACEMENT (IOC);  Surgeon: Birder Robson, MD;  Location: [facility];  Service: Ophthalmology;  Laterality: Left;  [place] 00:48 AP% 13.0 CDE 6.26 Fluid pack lot # [id number]   CATARACT EXTRACTION W/PHACO Right 10/15/2017   Procedure: CATARACT EXTRACTION PHACO AND INTRAOCULAR LENS PLACEMENT (IOC);  Surgeon: Birder Robson, MD;  Location: [facility];  Service: Ophthalmology;  Laterality: Right;  [place]  00:44 AP% 16.1 CDE 7.23 Fluid pack lot # [id number]   CHOLECYSTECTOMY     CORONARY ARTERY BYPASS GRAFT N/A 09/04/2019   Procedure: CORONARY ARTERY BYPASS GRAFTING (CABG), ON PUMP, TIMES THREE, LIMA TO LAD, SVG TO PDA, SVG TO OM 2;  Surgeon: Ivin Poot, MD;  Location: [place];  Service: Open Heart Surgery;  Laterality: N/A;   ENDOVEIN HARVEST OF GREATER SAPHENOUS VEIN Right 09/04/2019   Procedure: ENDOVEIN HARVEST OF GREATER SAPHENOUS VEIN;  Surgeon: Ivin Poot, MD;  Location: [place];  Service: Open Heart Surgery;  Laterality: Right;   LEFT HEART CATH AND CORONARY ANGIOGRAPHY N/A 08/27/2019   Procedure: LEFT HEART CATH AND CORONARY ANGIOGRAPHY;  Surgeon: Minna Merritts, MD;  Location: [place] CV LAB;  Service: Cardiovascular;  Laterality: N/A;   TEE WITHOUT CARDIOVERSION N/A 09/04/2019   Procedure: TRANSESOPHAGEAL ECHOCARDIOGRAM (TEE);  Surgeon: Prescott Gum, Collier Salina, MD;  Location: [place];  Service: Open Heart Surgery;  Laterality: N/A;    Current Outpatient Medications  Medication Sig Dispense Refill   acetaminophen (TYLENOL) 500 MG tablet Take 500 mg by mouth every 6 (  six) hours as needed for moderate pain or headache.     aspirin EC 81 MG tablet Take 1 tablet (81 mg total) by mouth daily. Swallow whole. 90 tablet 3   donepezil (ARICEPT) 5 MG tablet Take 5 mg by mouth at bedtime.     escitalopram (LEXAPRO) 10 MG tablet Take 1.5 mg by mouth daily.     ezetimibe (ZETIA) 10 MG tablet Take 1 tablet by mouth once daily 30 tablet 0    levothyroxine (SYNTHROID) 50 MCG tablet Take 50 mcg by mouth daily before breakfast.     nitroGLYCERIN (NITROSTAT) 0.4 MG SL tablet Place 1 tablet (0.4 mg total) under the tongue every 5 (five) minutes x 3 doses as needed for chest pain. 25 tablet 3   NON FORMULARY 300 mg daily. CBD GUMMIES     polyethylene glycol (MIRALAX) 17 g packet Take 17 g by mouth daily. 14 each 0   rosuvastatin (CRESTOR) 40 MG tablet Take 1 tablet (40 mg total) by mouth daily. 90 tablet 3   Thiamine HCl (VITAMIN B-1) 250 MG tablet Take 250 mg by mouth daily.     No current facility-administered medications for this visit.     Allergies:   Statins   Social History:  The patient  reports that she has never smoked. She has never used smokeless tobacco. She reports that she does not currently use alcohol. She reports that she does not use drugs.   Family History:   family history includes Emphysema in her mother; Heart attack in her father; Heart disease in her mother.    Review of Systems: Review of Systems  Constitutional: Negative.   HENT: Negative.    Respiratory: Negative.    Cardiovascular: Negative.   Gastrointestinal: Negative.   Musculoskeletal:  Positive for joint pain and neck pain.  Psychiatric/Behavioral: Negative.    All other systems reviewed and are negative.   PHYSICAL EXAM: VS:  LMP  (LMP Unknown)  , BMI There is no height or weight on file to calculate BMI.  Constitutional:  oriented to person, place, and time. No distress.  HENT:  Head: Grossly normal Eyes:  no discharge. No scleral icterus.  Neck: No JVD, no carotid bruits  Cardiovascular: Regular rate and rhythm, no murmurs appreciated Pulmonary/Chest: Clear to auscultation bilaterally, no wheezes or rails Abdominal: Soft.  no distension.  no tenderness.  Musculoskeletal: Normal range of motion Neurological:  normal muscle tone. Coordination normal. No atrophy Skin: Skin warm and dry Psychiatric: normal affect,  pleasant   Recent Labs: 06/07/2021: ALT 35   results have been requested   Lipid Panel Lab Results  Component Value Date   CHOL 137 06/07/2021   HDL 56 06/07/2021   LDLCALC 59 06/07/2021   TRIG 109 06/07/2021    Wt Readings from Last 3 Encounters:  02/14/21 170 lb (77.1 kg)  12/06/20 178 lb (80.7 kg)  11/01/20 175 lb (79.4 kg)     ASSESSMENT AND PLAN:   Hyperlipidemia - Plan: EKG 12-Lead On crestor and zetia Recommend she stay on the above medications Does not want PCSK9 inhibitor  SOB (shortness of breath) - Recommend she restart her walking program Reports she goes walking, shopping, no symptoms  Chronic diastolic CHF Euvolemic, off lasix Does not even remember being on Lasix, does not know if she has any at home Recommend she call us for worsening ankle swelling  CAD, s/p CABG Currently with no symptoms of angina. No further workup at this time. Continue current medication  regimen.  Arthritis Neck, shoulders Recommended stretching program, exercise  Tremor Prior spells, etiology unclear, none recently     Total encounter time more than 25 minutes  Greater than 50% was spent in counseling and coordination of care with the patient    No orders of the defined types were placed in this encounter.    Signed, Esmond Plants, M.D., Ph.D. 03/19/2022  [place], [place]

## 2022-03-20 ENCOUNTER — Ambulatory Visit: Payer: PPO | Attending: Cardiovascular Disease | Admitting: Cardiovascular Disease

## 2022-03-20 ENCOUNTER — Encounter: Payer: Self-pay | Admitting: Cardiovascular Disease

## 2022-03-20 DIAGNOSIS — I5032 Chronic diastolic (congestive) heart failure: Secondary | ICD-10-CM

## 2022-03-20 DIAGNOSIS — E785 Hyperlipidemia, unspecified: Secondary | ICD-10-CM

## 2022-03-20 DIAGNOSIS — E781 Pure hyperglyceridemia: Secondary | ICD-10-CM

## 2022-03-20 DIAGNOSIS — T466X5A Adverse effect of antihyperlipidemic and antiarteriosclerotic drugs, initial encounter: Secondary | ICD-10-CM

## 2022-03-20 DIAGNOSIS — Z789 Other specified health status: Secondary | ICD-10-CM

## 2022-03-20 DIAGNOSIS — R0602 Shortness of breath: Secondary | ICD-10-CM

## 2022-03-20 DIAGNOSIS — Z951 Presence of aortocoronary bypass graft: Secondary | ICD-10-CM

## 2022-03-20 DIAGNOSIS — I25118 Atherosclerotic heart disease of native coronary artery with other forms of angina pectoris: Secondary | ICD-10-CM

## 2022-04-03 ENCOUNTER — Telehealth: Payer: Self-pay | Admitting: Cardiovascular Disease

## 2022-04-03 MED ORDER — EZETIMIBE 10 MG PO TABS: 10.0000 mg | ORAL_TABLET | Freq: Every day | ORAL | 0 refills | Status: DC

## 2022-04-03 NOTE — Telephone Encounter (Signed)
*  STAT* If patient is at the pharmacy, call can be transferred to refill team.   1. Which medications need to be refilled? (please list name of each medication and dose if known) ezetimibe (ZETIA) 10 MG tablet   2. Which pharmacy/location (including street and city if local pharmacy) is medication to be sent to? [facility] -  ([address]), [place] - [address]   3. Do they need a 30 day or 90 day supply? 90 day supply   Patient has been out of medication for a month. An appt has been scheduled for 04/22.

## 2022-04-03 NOTE — Telephone Encounter (Signed)
Pt should keep future appointment for long term refills, Thank you! Refill has been sent until her next f/u. No further refills until pt has been seen.

## 2022-05-18 ENCOUNTER — Other Ambulatory Visit: Payer: Self-pay | Admitting: Cardiovascular Disease

## 2022-06-10 NOTE — Progress Notes (Deleted)
Patient ID: Bonnie Hancock, female   DOB: [birth year], 82 y.o.   MRN: [id number] Cardiology Office Note  Date:  06/10/2022   ID:  Bonnie Hancock, DOB 1940-10-13, MRN [id number]  PCP:  Jaclyn Shaggy, MD   No chief complaint on file.   HPI:  Bonnie Hancock is a 82 year-old woman,  With history of  Chronic diastolic CHF obesity,  Chronic shortness of breath with exertion Non-STEMI July 2021 troponin 8900 Who presents for routine follow-up of her coronary disease, S/P CABG, chronic shortness of breath  Presents today with her husband Last seen in clinic by myself October 2022 Rash on Plavix, this was held  Echo reviewed with her on today's visit Normal EF Mild to moderate MR  Not to lasix "I am doing a lot of peeing" Denies any leg swelling, no abdominal distention, feels her weight is stable  Goes shopping with her sister, otherwise no other exercise  Recently seen by pharmacy, encouraged to stay on her Crestor 10 with Zetia But not interested in PCSK9 inhibitor  Of past medical history reviewed  hospital with non-STEMI July 2021,  cardiac catheterization July 2021 Three-vessel coronary disease There is distal left main, ostial LAD, mid left circumflex, mid RCA disease, ostial/proximal OM disease Mildly depressed ejection fraction 45 to 50% with inferior wall hypokinesis  Underwent CABG September 04, 2019 CORONARY ARTERY BYPASS GRAFTING (CABG), ON PUMP, TIMES THREE, LIMA TO LAD, SVG TO PDA, SVG TO OM 2;   Notes reviewed from cardiothoracic surgery visit November 2021   PMH:   has a past medical history of Anxiety, Arthritis, Cancer (HCC), Coronary artery disease, Depression, Dyspnea, Heart murmur, HOH (hard of hearing), Hyperlipidemia, Hypothyroidism, and Non-STEMI (non-ST elevated myocardial infarction) (HCC).  PSH:    Past Surgical History:  Procedure Laterality Date   APPENDECTOMY     CATARACT EXTRACTION W/PHACO Left 09/17/2017   Procedure: CATARACT EXTRACTION PHACO AND  INTRAOCULAR LENS PLACEMENT (IOC);  Surgeon: Galen Manila, MD;  Location: [facility] ORS;  Service: Ophthalmology;  Laterality: Left;  [place] 00:48 AP% 13.0 CDE 6.26 Fluid pack lot # [id number]   CATARACT EXTRACTION W/PHACO Right 10/15/2017   Procedure: CATARACT EXTRACTION PHACO AND INTRAOCULAR LENS PLACEMENT (IOC);  Surgeon: Galen Manila, MD;  Location: [facility] ORS;  Service: Ophthalmology;  Laterality: Right;  [place]  00:44 AP% 16.1 CDE 7.23 Fluid pack lot # [id number]   CHOLECYSTECTOMY     CORONARY ARTERY BYPASS GRAFT N/A 09/04/2019   Procedure: CORONARY ARTERY BYPASS GRAFTING (CABG), ON PUMP, TIMES THREE, LIMA TO LAD, SVG TO PDA, SVG TO OM 2;  Surgeon: Kerin Perna, MD;  Location: [facility] OR;  Service: Open Heart Surgery;  Laterality: N/A;   ENDOVEIN HARVEST OF GREATER SAPHENOUS VEIN Right 09/04/2019   Procedure: ENDOVEIN HARVEST OF GREATER SAPHENOUS VEIN;  Surgeon: Kerin Perna, MD;  Location: [facility] OR;  Service: Open Heart Surgery;  Laterality: Right;   LEFT HEART CATH AND CORONARY ANGIOGRAPHY N/A 08/27/2019   Procedure: LEFT HEART CATH AND CORONARY ANGIOGRAPHY;  Surgeon: Antonieta Iba, MD;  Location: [facility] INVASIVE CV LAB;  Service: Cardiovascular;  Laterality: N/A;   TEE WITHOUT CARDIOVERSION N/A 09/04/2019   Procedure: TRANSESOPHAGEAL ECHOCARDIOGRAM (TEE);  Surgeon: Donata Clay, Theron Arista, MD;  Location: [facility] OR;  Service: Open Heart Surgery;  Laterality: N/A;    Current Outpatient Medications  Medication Sig Dispense Refill   acetaminophen (TYLENOL) 500 MG tablet Take 500 mg by mouth every 6 (six) hours as needed for moderate pain or headache.  aspirin EC 81 MG tablet Take 1 tablet (81 mg total) by mouth daily. Swallow whole. 90 tablet 3   donepezil (ARICEPT) 5 MG tablet Take 5 mg by mouth at bedtime.     escitalopram (LEXAPRO) 10 MG tablet Take 1.5 mg by mouth daily.     ezetimibe (ZETIA) 10 MG tablet Take 1 tablet (10 mg total) by mouth daily. 90 tablet 0   levothyroxine (SYNTHROID) 50 MCG tablet  Take 50 mcg by mouth daily before breakfast.     nitroGLYCERIN (NITROSTAT) 0.4 MG SL tablet Place 1 tablet (0.4 mg total) under the tongue every 5 (five) minutes x 3 doses as needed for chest pain. 25 tablet 3   NON FORMULARY 300 mg daily. CBD GUMMIES     polyethylene glycol (MIRALAX) 17 g packet Take 17 g by mouth daily. 14 each 0   rosuvastatin (CRESTOR) 40 MG tablet Take 1 tablet (40 mg total) by mouth daily. 90 tablet 3   Thiamine HCl (VITAMIN B-1) 250 MG tablet Take 250 mg by mouth daily.     No current facility-administered medications for this visit.     Allergies:   Statins   Social History:  The patient  reports that she has never smoked. She has never used smokeless tobacco. She reports that she does not currently use alcohol. She reports that she does not use drugs.   Family History:   family history includes Emphysema in her mother; Heart attack in her father; Heart disease in her mother.    Review of Systems: Review of Systems  Constitutional: Negative.   HENT: Negative.    Respiratory: Negative.    Cardiovascular: Negative.   Gastrointestinal: Negative.   Musculoskeletal:  Positive for joint pain and neck pain.  Psychiatric/Behavioral: Negative.    All other systems reviewed and are negative.   PHYSICAL EXAM: VS:  LMP  (LMP Unknown)  , BMI There is no height or weight on file to calculate BMI.  Constitutional:  oriented to person, place, and time. No distress.  HENT:  Head: Grossly normal Eyes:  no discharge. No scleral icterus.  Neck: No JVD, no carotid bruits  Cardiovascular: Regular rate and rhythm, no murmurs appreciated Pulmonary/Chest: Clear to auscultation bilaterally, no wheezes or rails Abdominal: Soft.  no distension.  no tenderness.  Musculoskeletal: Normal range of motion Neurological:  normal muscle tone. Coordination normal. No atrophy Skin: Skin warm and dry Psychiatric: normal affect, pleasant   Recent Labs: No results found for requested  labs within last 365 days.   results have been requested   Lipid Panel Lab Results  Component Value Date   CHOL 137 06/07/2021   HDL 56 06/07/2021   LDLCALC 59 06/07/2021   TRIG 109 06/07/2021    Wt Readings from Last 3 Encounters:  02/14/21 170 lb (77.1 kg)  12/06/20 178 lb (80.7 kg)  11/01/20 175 lb (79.4 kg)     ASSESSMENT AND PLAN:   Hyperlipidemia - Plan: EKG 12-Lead On crestor and zetia Recommend she stay on the above medications Does not want PCSK9 inhibitor  SOB (shortness of breath) - Recommend she restart her walking program Reports she goes walking, shopping, no symptoms  Chronic diastolic CHF Euvolemic, off lasix Does not even remember being on Lasix, does not know if she has any at home Recommend she call us for worsening ankle swelling  CAD, s/p CABG Currently with no symptoms of angina. No further workup at this time. Continue current medication regimen.  Arthritis Neck,  shoulders Recommended stretching program, exercise  Tremor Prior spells, etiology unclear, none recently     Total encounter time more than 25 minutes  Greater than 50% was spent in counseling and coordination of care with the patient    No orders of the defined types were placed in this encounter.    Signed, Dossie Arbour, M.D., Ph.D. 06/10/2022  [facility] [place], [place] [phone]

## 2022-06-11 ENCOUNTER — Ambulatory Visit: Payer: PPO | Admitting: Cardiovascular Disease

## 2022-06-11 DIAGNOSIS — E781 Pure hyperglyceridemia: Secondary | ICD-10-CM

## 2022-06-11 DIAGNOSIS — R0602 Shortness of breath: Secondary | ICD-10-CM

## 2022-06-11 DIAGNOSIS — I25118 Atherosclerotic heart disease of native coronary artery with other forms of angina pectoris: Secondary | ICD-10-CM

## 2022-06-11 DIAGNOSIS — Z951 Presence of aortocoronary bypass graft: Secondary | ICD-10-CM

## 2022-06-11 DIAGNOSIS — R0789 Other chest pain: Secondary | ICD-10-CM

## 2022-06-11 DIAGNOSIS — I5032 Chronic diastolic (congestive) heart failure: Secondary | ICD-10-CM

## 2022-06-11 DIAGNOSIS — T466X5A Adverse effect of antihyperlipidemic and antiarteriosclerotic drugs, initial encounter: Secondary | ICD-10-CM

## 2022-06-11 DIAGNOSIS — E785 Hyperlipidemia, unspecified: Secondary | ICD-10-CM

## 2022-06-19 ENCOUNTER — Other Ambulatory Visit: Payer: Self-pay | Admitting: Cardiovascular Disease

## 2022-07-02 NOTE — Progress Notes (Deleted)
Patient ID: Bonnie Hancock, female   DOB: [birth year], 82 y.o.   MRN: [id number] Cardiology Office Note  Date:  07/02/2022   ID:  Bonnie Hancock, DOB Aug 02, 1940, MRN [id number]  PCP:  Jaclyn Shaggy, MD   No chief complaint on file.   HPI:  Ms. Caplan is a 82 year-old woman,  With history of  Chronic diastolic CHF obesity,  Chronic shortness of breath with exertion Non-STEMI July 2021 troponin 8900 Who presents for routine follow-up of her coronary disease, S/P CABG, chronic shortness of breath  Last seen by myself in clinic October 2022   Presents today with her husband Last seen in clinic by myself October 2022 Rash on Plavix, this was held  Echo reviewed with her on today's visit Normal EF Mild to moderate MR  Not to lasix "I am doing a lot of peeing" Denies any leg swelling, no abdominal distention, feels her weight is stable  Goes shopping with her sister, otherwise no other exercise  Recently seen by pharmacy, encouraged to stay on her Crestor 10 with Zetia But not interested in PCSK9 inhibitor  Of past medical history reviewed  hospital with non-STEMI July 2021,  cardiac catheterization July 2021 Three-vessel coronary disease There is distal left main, ostial LAD, mid left circumflex, mid RCA disease, ostial/proximal OM disease Mildly depressed ejection fraction 45 to 50% with inferior wall hypokinesis  Underwent CABG September 04, 2019 CORONARY ARTERY BYPASS GRAFTING (CABG), ON PUMP, TIMES THREE, LIMA TO LAD, SVG TO PDA, SVG TO OM 2;   Notes reviewed from cardiothoracic surgery visit November 2021   PMH:   has a past medical history of Anxiety, Arthritis, Cancer (HCC), Coronary artery disease, Depression, Dyspnea, Heart murmur, HOH (hard of hearing), Hyperlipidemia, Hypothyroidism, and Non-STEMI (non-ST elevated myocardial infarction) (HCC).  PSH:    Past Surgical History:  Procedure Laterality Date   APPENDECTOMY     CATARACT EXTRACTION W/PHACO Left  09/17/2017   Procedure: CATARACT EXTRACTION PHACO AND INTRAOCULAR LENS PLACEMENT (IOC);  Surgeon: Bonnie Manila, MD;  Location: [facility] ORS;  Service: Ophthalmology;  Laterality: Left;  [place] 00:48 AP% 13.0 CDE 6.26 Fluid pack lot # [id number]   CATARACT EXTRACTION W/PHACO Right 10/15/2017   Procedure: CATARACT EXTRACTION PHACO AND INTRAOCULAR LENS PLACEMENT (IOC);  Surgeon: Bonnie Manila, MD;  Location: [facility] ORS;  Service: Ophthalmology;  Laterality: Right;  [place]  00:44 AP% 16.1 CDE 7.23 Fluid pack lot # [id number]   CHOLECYSTECTOMY     CORONARY ARTERY BYPASS GRAFT N/A 09/04/2019   Procedure: CORONARY ARTERY BYPASS GRAFTING (CABG), ON PUMP, TIMES THREE, LIMA TO LAD, SVG TO PDA, SVG TO OM 2;  Surgeon: Bonnie Perna, MD;  Location: [facility] OR;  Service: Open Heart Surgery;  Laterality: N/A;   ENDOVEIN HARVEST OF GREATER SAPHENOUS VEIN Right 09/04/2019   Procedure: ENDOVEIN HARVEST OF GREATER SAPHENOUS VEIN;  Surgeon: Bonnie Perna, MD;  Location: [facility] OR;  Service: Open Heart Surgery;  Laterality: Right;   LEFT HEART CATH AND CORONARY ANGIOGRAPHY N/A 08/27/2019   Procedure: LEFT HEART CATH AND CORONARY ANGIOGRAPHY;  Surgeon: Bonnie Iba, MD;  Location: [facility] INV[facility] CV LAB;  Service: Cardiovascular;  Laterality: N/A;   TEE WITHOUT CARDIOVERSION N/A 09/04/2019   Procedure: TRANSESOPHAGEAL ECHOCARDIOGRAM (TEE);  Surgeon: Bonnie Hancock, Bonnie Arista, MD;  Location: [facility] OR;  Service: Open Heart Surgery;  Laterality: N/A;    Current Outpatient Medications  Medication Sig Dispense Refill   acetaminophen (TYLENOL) 500 MG tablet Take 500 mg by mouth every  6 (six) hours as needed for moderate pain or headache.     aspirin EC 81 MG tablet Take 1 tablet (81 mg total) by mouth daily. Swallow whole. 90 tablet 3   donepezil (ARICEPT) 5 MG tablet Take 5 mg by mouth at bedtime.     escitalopram (LEXAPRO) 10 MG tablet Take 1.5 mg by mouth daily.     ezetimibe (ZETIA) 10 MG tablet Take 1 tablet (10 mg total) by mouth daily.  90 tablet 0   levothyroxine (SYNTHROID) 50 MCG tablet Take 50 mcg by mouth daily before breakfast.     nitroGLYCERIN (NITROSTAT) 0.4 MG SL tablet Place 1 tablet (0.4 mg total) under the tongue every 5 (five) minutes x 3 doses as needed for chest pain. 25 tablet 3   NON FORMULARY 300 mg daily. CBD GUMMIES     polyethylene glycol (MIRALAX) 17 g packet Take 17 g by mouth daily. 14 each 0   rosuvastatin (CRESTOR) 40 MG tablet Take 1 tablet by mouth once daily 14 tablet 0   Thiamine HCl (VITAMIN B-1) 250 MG tablet Take 250 mg by mouth daily.     No current facility-administered medications for this visit.     Allergies:   Statins   Social History:  The patient  reports that she has never smoked. She has never used smokeless tobacco. She reports that she does not currently use alcohol. She reports that she does not use drugs.   Family History:   family history includes Emphysema in her mother; Heart attack in her father; Heart disease in her mother.    Review of Systems: Review of Systems  Constitutional: Negative.   HENT: Negative.    Respiratory: Negative.    Cardiovascular: Negative.   Gastrointestinal: Negative.   Musculoskeletal:  Positive for joint pain and neck pain.  Psychiatric/Behavioral: Negative.    All other systems reviewed and are negative.   PHYSICAL EXAM: VS:  LMP  (LMP Unknown)  , BMI There is no height or weight on file to calculate BMI.  Constitutional:  oriented to person, place, and time. No distress.  HENT:  Head: Grossly normal Eyes:  no discharge. No scleral icterus.  Neck: No JVD, no carotid bruits  Cardiovascular: Regular rate and rhythm, no murmurs appreciated Pulmonary/Chest: Clear to auscultation bilaterally, no wheezes or rails Abdominal: Soft.  no distension.  no tenderness.  Musculoskeletal: Normal range of motion Neurological:  normal muscle tone. Coordination normal. No atrophy Skin: Skin warm and dry Psychiatric: normal affect,  pleasant   Recent Labs: No results found for requested labs within last 365 days.   results have been requested   Lipid Panel Lab Results  Component Value Date   CHOL 137 06/07/2021   HDL 56 06/07/2021   LDLCALC 59 06/07/2021   TRIG 109 06/07/2021    Wt Readings from Last 3 Encounters:  02/14/21 170 lb (77.1 kg)  12/06/20 178 lb (80.7 kg)  11/01/20 175 lb (79.4 kg)     ASSESSMENT AND PLAN:   Hyperlipidemia - Plan: EKG 12-Lead On crestor and zetia Recommend she stay on the above medications Does not want PCSK9 inhibitor  SOB (shortness of breath) - Recommend she restart her walking program Reports she goes walking, shopping, no symptoms  Chronic diastolic CHF Euvolemic, off lasix Does not even remember being on Lasix, does not know if she has any at home Recommend she call us for worsening ankle swelling  CAD, s/p CABG Currently with no symptoms of angina. No  further workup at this time. Continue current medication regimen.  Arthritis Neck, shoulders Recommended stretching program, exercise  Tremor Prior spells, etiology unclear, none recently     Total encounter time more than 25 minutes  Greater than 50% was spent in counseling and coordination of care with the patient    No orders of the defined types were placed in this encounter.    Signed, Dossie Arbour, M.D., Ph.D. 07/02/2022  [facility] [place], [place] [phone]

## 2022-07-04 ENCOUNTER — Ambulatory Visit: Payer: PPO | Attending: Cardiovascular Disease | Admitting: Cardiovascular Disease

## 2022-07-04 DIAGNOSIS — R0602 Shortness of breath: Secondary | ICD-10-CM

## 2022-07-04 DIAGNOSIS — E785 Hyperlipidemia, unspecified: Secondary | ICD-10-CM

## 2022-07-04 DIAGNOSIS — I5032 Chronic diastolic (congestive) heart failure: Secondary | ICD-10-CM

## 2022-07-04 DIAGNOSIS — E781 Pure hyperglyceridemia: Secondary | ICD-10-CM

## 2022-07-04 DIAGNOSIS — M791 Myalgia, unspecified site: Secondary | ICD-10-CM

## 2022-07-04 DIAGNOSIS — Z951 Presence of aortocoronary bypass graft: Secondary | ICD-10-CM

## 2022-07-04 DIAGNOSIS — I25118 Atherosclerotic heart disease of native coronary artery with other forms of angina pectoris: Secondary | ICD-10-CM

## 2022-07-05 ENCOUNTER — Encounter: Payer: Self-pay | Admitting: Cardiovascular Disease

## 2022-07-29 ENCOUNTER — Other Ambulatory Visit: Payer: Self-pay | Admitting: Cardiovascular Disease

## 2022-08-14 ENCOUNTER — Ambulatory Visit: Payer: PPO | Attending: Cardiovascular Disease | Admitting: Medical

## 2022-08-14 ENCOUNTER — Encounter: Payer: Self-pay | Admitting: Medical

## 2022-08-14 VITALS — BP 140/60 | HR 59 | Ht 62.0 in | Wt 163.2 lb

## 2022-08-14 DIAGNOSIS — I25118 Atherosclerotic heart disease of native coronary artery with other forms of angina pectoris: Secondary | ICD-10-CM

## 2022-08-14 DIAGNOSIS — I10 Essential (primary) hypertension: Secondary | ICD-10-CM | POA: Diagnosis not present

## 2022-08-14 DIAGNOSIS — I5032 Chronic diastolic (congestive) heart failure: Secondary | ICD-10-CM | POA: Diagnosis not present

## 2022-08-14 DIAGNOSIS — E782 Mixed hyperlipidemia: Secondary | ICD-10-CM

## 2022-08-14 MED ORDER — EZETIMIBE 10 MG PO TABS: ORAL_TABLET | ORAL | 3 refills | Status: DC

## 2022-08-14 MED ORDER — ROSUVASTATIN CALCIUM 40 MG PO TABS: 40.0000 mg | ORAL_TABLET | Freq: Every day | ORAL | 3 refills | Status: DC

## 2022-08-14 MED ORDER — NITROGLYCERIN 0.4 MG SL SUBL: 0.4000 mg | SUBLINGUAL_TABLET | SUBLINGUAL | 3 refills | Status: DC | PRN

## 2022-08-14 NOTE — Patient Instructions (Signed)
Medication Instructions:  Your physician recommends that you continue on your current medications as directed. Please refer to the Current Medication list given to you today.  *If you need a refill on your cardiac medications before your next appointment, please call your pharmacy*   Lab Work: None ordered today   Testing/Procedures: None ordered today   Follow-Up: At [facility], you and your health needs are our priority.  As part of our continuing mission to provide you with exceptional heart care, we have created designated Provider Care Teams.  These Care Teams include your primary Cardiologist (physician) and Advanced Practice Providers (APPs -  Physician Assistants and Nurse Practitioners) who all work together to provide you with the care you need, when you need it.  We recommend signing up for the patient portal called "MyChart".  Sign up information is provided on this After Visit Summary.  MyChart is used to connect with patients for Virtual Visits (Telemedicine).  Patients are able to view lab/test results, encounter notes, upcoming appointments, etc.  Non-urgent messages can be sent to your provider as well.   To learn more about what you can do with MyChart, go to [email or web address].    Your next appointment:   1 year(s)  Provider:   You may see Julien Nordmann, MD or one of the following Advanced Practice Providers on your designated Care Team:   Nicolasa Ducking, NP Eula Listen, PA-C Cadence Fransico Michael, PA-C Charlsie Quest, NP

## 2022-08-14 NOTE — Progress Notes (Signed)
Cardiology Office Note:    Date:  08/14/2022   ID:  Bonnie Hancock, DOB November 26, 1940, MRN [id number]  PCP:  Jaclyn Shaggy, MD  [facility] Cardiologist:  Julien Nordmann, MD  [facility] Electrophysiologist:  None   Referring MD: Jaclyn Shaggy, MD   Chief Complaint: 1 year follow-up  History of Present Illness:    Bonnie Hancock is a 82 y.o. female with a hx of chronic diastolic heart failure, chronic SOB with exertion, CAD s/p CABG who presents for follow-up.   Hospitalized July 2021 with NSTEMI. Cardiac cath showed 3V CAD, LVEF 45-50%. The patient underwent CABG in July 2021 with LIMA to LAD, SVG to PDA and SVG to OM2. She reported rash on Plavix, and this was held.   Echo 10/22 showed improved LVEF 60-65%, no WMA, indeterminate diastolic parameters, normal RVSF, mild to mod MR.  Last seen 11/2020 and reported SOB, walking program was recommended.   Today, the patient is overall doing well from a cardiac perspective. She denies chest pain or shortness of breath. No lower leg edema, orthopnea, pnd, palpitations. Occasional dizziness or lightheadedness when she stands up. She needs refills today. She lives with daughter, she can perform ADLs. She is having arthritis in her neck.  Past Medical History:  Diagnosis Date   Anxiety    Arthritis    Cancer (HCC)    UTERINE   Coronary artery disease    Depression    Dyspnea    Heart murmur    HOH (hard of hearing)    Hyperlipidemia    Hypothyroidism    Non-STEMI (non-ST elevated myocardial infarction) [facility])     Past Surgical History:  Procedure Laterality Date   APPENDECTOMY     CATARACT EXTRACTION W/PHACO Left 09/17/2017   Procedure: CATARACT EXTRACTION PHACO AND INTRAOCULAR LENS PLACEMENT (IOC);  Surgeon: Galen Manila, MD;  Location: [facility] ORS;  Service: Ophthalmology;  Laterality: Left;  [place] 00:48 AP% 13.0 CDE 6.26 Fluid pack lot # [id number]   CATARACT EXTRACTION W/PHACO Right 10/15/2017   Procedure: CATARACT  EXTRACTION PHACO AND INTRAOCULAR LENS PLACEMENT (IOC);  Surgeon: Galen Manila, MD;  Location: [facility] ORS;  Service: Ophthalmology;  Laterality: Right;  [place]  00:44 AP% 16.1 CDE 7.23 Fluid pack lot # [id number]   CHOLECYSTECTOMY     CORONARY ARTERY BYPASS GRAFT N/A 09/04/2019   Procedure: CORONARY ARTERY BYPASS GRAFTING (CABG), ON PUMP, TIMES THREE, LIMA TO LAD, SVG TO PDA, SVG TO OM 2;  Surgeon: Kerin Perna, MD;  [facility];  Service: Open Heart Surgery;  Laterality: N/A;   ENDOVEIN HARVEST OF GREATER SAPHENOUS VEIN Right 09/04/2019   Procedure: ENDOVEIN HARVEST OF GREATER SAPHENOUS VEIN;  Surgeon: Kerin Perna, MD;  Location: [facility] OR;  Service: Open Heart Surgery;  Laterality: Right;   LEFT HEART CATH AND CORONARY ANGIOGRAPHY N/A 08/27/2019   Procedure: LEFT HEART CATH AND CORONARY ANGIOGRAPHY;  Surgeon: Antonieta Iba, MD;  Location: [facility] INVASIVE CV LAB;  Service: Cardiovascular;  Laterality: N/A;   TEE WITHOUT CARDIOVERSION N/A 09/04/2019   Procedure: TRANSESOPHAGEAL ECHOCARDIOGRAM (TEE);  Surgeon: Donata Clay, Theron Arista, MD;  Location: [facility] OR;  Service: Open Heart Surgery;  Laterality: N/A;    Current Medications: Current Meds  Medication Sig   acetaminophen (TYLENOL) 500 MG tablet Take 500 mg by mouth every 6 (six) hours as needed for moderate pain or headache.   aspirin EC 81 MG tablet Take 1 tablet (81 mg total) by mouth daily. Swallow whole.  donepezil (ARICEPT) 5 MG tablet Take 5 mg by mouth at bedtime.   escitalopram (LEXAPRO) 10 MG tablet Take 1.5 mg by mouth daily.   levothyroxine (SYNTHROID) 50 MCG tablet Take 50 mcg by mouth daily before breakfast.   Thiamine HCl (VITAMIN B-1) 250 MG tablet Take 250 mg by mouth daily.   [DISCONTINUED] ezetimibe (ZETIA) 10 MG tablet TAKE 1 TABLET BY MOUTH ONCE DAILY.  KEEP APPOINTMENT FOR REFILLS   [DISCONTINUED] nitroGLYCERIN (NITROSTAT) 0.4 MG SL tablet Place 1 tablet (0.4 mg total) under the tongue every 5 (five) minutes x 3 doses as  needed for chest pain.   [DISCONTINUED] rosuvastatin (CRESTOR) 40 MG tablet Take 1 tablet by mouth once daily     Allergies:   Statins   Social History   Socioeconomic History   Marital status: Married    Spouse name: Not on file   Number of children: 4   Years of education: Not on file   Highest education level: Not on file  Occupational History   Occupation: retired  Tobacco Use   Smoking status: Never   Smokeless tobacco: Never  Vaping Use   Vaping Use: Never used  Substance and Sexual Activity   Alcohol use: Not Currently   Drug use: No   Sexual activity: Not on file  Other Topics Concern   Not on file  Social History Narrative   Right handed    Lives with husband   Social Determinants of Health   Financial Resource Strain: Not on file  Food Insecurity: Not on file  Transportation Needs: Not on file  Physical Activity: Not on file  Stress: Not on file  Social Connections: Not on file     Family History: The patient's family history includes Emphysema in her mother; Heart attack in her father; Heart disease in her mother.  ROS:   Please see the history of present illness.     All other systems reviewed and are negative.  EKGs/Labs/Other Studies Reviewed:    The following studies were reviewed today:  Echo 2922 1. Left ventricular ejection fraction, by estimation, is 60 to 65%. The  left ventricle has normal function. The left ventricle has no regional  wall motion abnormalities. Left ventricular diastolic parameters are  indeterminate.   2. Right ventricular systolic function is normal. The right ventricular  size is normal. Tricuspid regurgitation signal is inadequate for assessing  PA pressure.   3. The mitral valve is normal in structure. Mild to moderate mitral valve  regurgitation. No evidence of mitral stenosis.   4. The aortic valve is normal in structure. Aortic valve regurgitation is  not visualized. No aortic stenosis is present.   5. The  inferior vena cava is normal in size with greater than 50%  respiratory variability, suggesting right atrial pressure of 3 mmHg.   Left heart cath 2021 Mid LM to Dist LM lesion is 70% stenosed. Ost LAD to Prox LAD lesion is 80% stenosed. Prox Cx to Mid Cx lesion is 70% stenosed. Prox RCA to Mid RCA lesion is 99% stenosed. 2nd Mrg lesion is 60% stenosed. Mid LAD lesion is 50% stenosed. The left ventricular ejection fraction is 45-50% by visual estimate. There is no mitral valve regurgitation. LV end diastolic pressure is mildly elevated. There is mild left ventricular systolic dysfunction.  EKG:  EKG is ordered today.  The ekg ordered today demonstrates SB 59bpm, TWI III  Recent Labs: No results found for requested labs within last 365 days.  Recent  Lipid Panel    Component Value Date/Time   CHOL 137 06/07/2021 0855   CHOL 164 11/01/2020 1448   TRIG 109 06/07/2021 0855   HDL 56 06/07/2021 0855   HDL 42 11/01/2020 1448   CHOLHDL 2.4 06/07/2021 0855   VLDL 22 06/07/2021 0855   LDLCALC 59 06/07/2021 0855   LDLCALC 94 11/01/2020 1448   LDLDIRECT 97 11/01/2020 1448    Physical Exam:    VS:  BP (!) 140/60 (BP Location: Left Arm, Patient Position: Sitting, Cuff Size: Normal)   Pulse (!) 59   Ht 5\' 2"  (1.575 m)   Wt 163 lb 3.2 oz (74 kg)   LMP  (LMP Unknown)   SpO2 98%   BMI 29.85 kg/m     Wt Readings from Last 3 Encounters:  08/14/22 163 lb 3.2 oz (74 kg)  02/14/21 170 lb (77.1 kg)  12/06/20 178 lb (80.7 kg)     GEN:  Well nourished, well developed in no acute distress HEENT: Normal NECK: No JVD; No carotid bruits LYMPHATICS: No lymphadenopathy CARDIAC: RRR, no murmurs, rubs, gallops RESPIRATORY:  Clear to auscultation without rales, wheezing or rhonchi  ABDOMEN: Soft, non-tender, non-distended MUSCULOSKELETAL:  No edema; No deformity  SKIN: Warm and dry NEUROLOGIC:  Alert and oriented x 3 PSYCHIATRIC:  Normal affect   ASSESSMENT:    1. Coronary artery disease  of native artery of native heart with stable angina pectoris (HCC)   2. Chronic diastolic heart failure (HCC)   3. Essential hypertension   4. Hyperlipidemia, mixed    PLAN:    In order of problems listed above:  CAD s/p remote CABG The patient denies chest pain or shortness of breath. She is able to perform ADLs. She is need refills of cardiac medications today. Continue Aspirin, Crestor, SL NTG and Zetia. Refill meds today  HFpEF Echo in 2022 showed LVEF 60-65%, normal RVSF, mild to mod MR. The patient is euvolemic on exam.   HTN BP is reasonable. She is not on antihypertensives at baseline.  HLD LDL 59 in 2023. Continue Crestor and Zetia.   Disposition: Follow up in 1 year(s) with MD/APP   Signed, Umaiza Matusik David Stall, PA-C  08/14/2022 2:01 PM    [facility]

## 2022-08-16 NOTE — Addendum Note (Signed)
Addended by: Sherri Rad C on: 08/16/2022 10:41 AM   Modules accepted: Orders

## 2022-10-19 ENCOUNTER — Encounter: Payer: Self-pay | Admitting: Family Medicine

## 2022-10-19 DIAGNOSIS — Z1231 Encounter for screening mammogram for malignant neoplasm of breast: Secondary | ICD-10-CM

## 2022-10-23 ENCOUNTER — Other Ambulatory Visit: Payer: Self-pay | Admitting: Family Medicine

## 2022-10-23 ENCOUNTER — Encounter: Payer: Self-pay | Admitting: Family Medicine

## 2022-10-23 DIAGNOSIS — N644 Mastodynia: Secondary | ICD-10-CM

## 2022-10-24 ENCOUNTER — Inpatient Hospital Stay
Admission: RE | Admit: 2022-10-24 | Discharge: 2022-10-24 | Disposition: A | Discharge status: Home / Self Care | Payer: Self-pay | Source: Ambulatory Visit | Attending: Internal Medicine | Admitting: Internal Medicine

## 2022-10-24 ENCOUNTER — Other Ambulatory Visit: Payer: Self-pay | Admitting: *Deleted

## 2022-10-24 DIAGNOSIS — Z1231 Encounter for screening mammogram for malignant neoplasm of breast: Secondary | ICD-10-CM

## 2022-10-31 ENCOUNTER — Inpatient Hospital Stay: Admission: RE | Admit: 2022-10-31 | Payer: PPO | Source: Ambulatory Visit

## 2022-10-31 ENCOUNTER — Other Ambulatory Visit: Payer: PPO

## 2023-02-06 ENCOUNTER — Ambulatory Visit
Admission: RE | Admit: 2023-02-06 | Discharge: 2023-02-06 | Disposition: A | Discharge status: Home / Self Care | Payer: PPO | Source: Ambulatory Visit | Attending: Physician Assistant | Admitting: Physician Assistant

## 2023-02-06 ENCOUNTER — Other Ambulatory Visit: Payer: Self-pay | Admitting: Physician Assistant

## 2023-02-06 DIAGNOSIS — R1032 Left lower quadrant pain: Secondary | ICD-10-CM

## 2023-02-06 DIAGNOSIS — R1013 Epigastric pain: Secondary | ICD-10-CM

## 2023-02-06 MED ORDER — IOHEXOL 300 MG/ML  SOLN
100.0000 mL | Freq: Once | INTRAMUSCULAR | Status: AC | PRN
  Administered 2023-02-06: 100 mL via INTRAVENOUS

## 2023-02-11 ENCOUNTER — Inpatient Hospital Stay: Payer: PPO | Attending: Oncology | Admitting: Oncology

## 2023-02-11 ENCOUNTER — Encounter: Payer: Self-pay | Admitting: Oncology

## 2023-02-11 ENCOUNTER — Inpatient Hospital Stay: Payer: PPO

## 2023-02-11 VITALS — BP 119/80 | HR 85 | Temp 98.3°F | Resp 18 | Wt 145.4 lb

## 2023-02-11 DIAGNOSIS — I10 Essential (primary) hypertension: Secondary | ICD-10-CM | POA: Diagnosis not present

## 2023-02-11 DIAGNOSIS — E039 Hypothyroidism, unspecified: Secondary | ICD-10-CM | POA: Diagnosis not present

## 2023-02-11 DIAGNOSIS — Z79890 Hormone replacement therapy (postmenopausal): Secondary | ICD-10-CM | POA: Insufficient documentation

## 2023-02-11 DIAGNOSIS — I252 Old myocardial infarction: Secondary | ICD-10-CM | POA: Diagnosis not present

## 2023-02-11 DIAGNOSIS — K8689 Other specified diseases of pancreas: Secondary | ICD-10-CM

## 2023-02-11 DIAGNOSIS — R5383 Other fatigue: Secondary | ICD-10-CM | POA: Insufficient documentation

## 2023-02-11 DIAGNOSIS — Z803 Family history of malignant neoplasm of breast: Secondary | ICD-10-CM | POA: Diagnosis not present

## 2023-02-11 DIAGNOSIS — D378 Neoplasm of uncertain behavior of other specified digestive organs: Secondary | ICD-10-CM | POA: Insufficient documentation

## 2023-02-11 DIAGNOSIS — Z79899 Other long term (current) drug therapy: Secondary | ICD-10-CM | POA: Insufficient documentation

## 2023-02-11 DIAGNOSIS — I2510 Atherosclerotic heart disease of native coronary artery without angina pectoris: Secondary | ICD-10-CM | POA: Diagnosis not present

## 2023-02-11 DIAGNOSIS — K869 Disease of pancreas, unspecified: Secondary | ICD-10-CM | POA: Diagnosis not present

## 2023-02-11 DIAGNOSIS — E785 Hyperlipidemia, unspecified: Secondary | ICD-10-CM | POA: Diagnosis not present

## 2023-02-11 LAB — CMP (CANCER CENTER ONLY)
ALT: 31 U/L (ref 0–44)
AST: 49 U/L — ABNORMAL HIGH (ref 15–41)
Albumin: 4.2 g/dL (ref 3.5–5.0)
Alkaline Phosphatase: 54 U/L (ref 38–126)
Anion gap: 12 (ref 5–15)
BUN: 23 mg/dL (ref 8–23)
CO2: 25 mmol/L (ref 22–32)
Calcium: 10.4 mg/dL — ABNORMAL HIGH (ref 8.9–10.3)
Chloride: 98 mmol/L (ref 98–111)
Creatinine: 1.3 mg/dL — ABNORMAL HIGH (ref 0.44–1.00)
GFR, Estimated: 41 mL/min — ABNORMAL LOW (ref >60)
Glucose, Bld: 160 mg/dL — ABNORMAL HIGH (ref 70–99)
Potassium: 3.8 mmol/L (ref 3.5–5.1)
Sodium: 135 mmol/L (ref 135–145)
Total Bilirubin: 1.3 mg/dL — ABNORMAL HIGH (ref <1.2)
Total Protein: 7.5 g/dL (ref 6.5–8.1)

## 2023-02-11 LAB — CBC WITH DIFFERENTIAL (CANCER CENTER ONLY)
Abs Immature Granulocytes: 0.02 10*3/uL (ref 0.00–0.07)
Basophils Absolute: 0 10*3/uL (ref 0.0–0.1)
Basophils Relative: 1 %
Eosinophils Absolute: 0.1 10*3/uL (ref 0.0–0.5)
Eosinophils Relative: 1 %
HCT: 45.5 % (ref 36.0–46.0)
Hemoglobin: 15.2 g/dL — ABNORMAL HIGH (ref 12.0–15.0)
Immature Granulocytes: 0 %
Lymphocytes Relative: 21 %
Lymphs Abs: 1.7 10*3/uL (ref 0.7–4.0)
MCH: 30.6 pg (ref 26.0–34.0)
MCHC: 33.4 g/dL (ref 30.0–36.0)
MCV: 91.7 fL (ref 80.0–100.0)
Monocytes Absolute: 0.7 10*3/uL (ref 0.1–1.0)
Monocytes Relative: 8 %
Neutro Abs: 5.8 10*3/uL (ref 1.7–7.7)
Neutrophils Relative %: 69 %
Platelet Count: 197 10*3/uL (ref 150–400)
RBC: 4.96 MIL/uL (ref 3.87–5.11)
RDW: 12.4 % (ref 11.5–15.5)
WBC Count: 8.3 10*3/uL (ref 4.0–10.5)
nRBC: 0 % (ref 0.0–0.2)

## 2023-02-11 NOTE — Progress Notes (Signed)
Provided nurse navigator information. Referral sent to [facility] for EUS per patient preference. Referral sent to [facility] hepatobiliary surgeon for evaluation.

## 2023-02-12 ENCOUNTER — Other Ambulatory Visit: Payer: Self-pay | Admitting: *Deleted

## 2023-02-12 DIAGNOSIS — K8689 Other specified diseases of pancreas: Secondary | ICD-10-CM

## 2023-02-14 LAB — CANCER ANTIGEN 19-9: CA 19-9: 3221 U/mL — ABNORMAL HIGH (ref 0–35)

## 2023-02-15 ENCOUNTER — Encounter: Payer: Self-pay | Admitting: Oncology

## 2023-02-15 NOTE — Progress Notes (Signed)
Hematology/Oncology Consult note [facility] [phone] [phone]  Patient Care Team: Jaclyn Shaggy, MD as PCP - General (Unknown Physician Specialty) Antonieta Iba, MD as PCP - Cardiology (Cardiology) Antonieta Iba, MD as Consulting Physician (Cardiology) Van Clines, MD as Consulting Physician (Neurology) Benita Gutter, RN as Oncology Nurse Navigator   Name of the patient: Bonnie Hancock  [id number]  [birth year]    Reason for referral-pancreatic mass   Referring physician-Dr. Arlana Pouch  Date of visit: 02/15/23   History of presenting illness- Patient is a 82 year old female with a past medical history significant for hypertension hyperlipidemia, CAD s/p CABG who underwent CT abdomen on 02/06/2023 for symptoms of epigastric pain.  CT scan showed decreased enhancement in the mid and portion of pancreatic body concerning for hypoenhancing mass measuring up to 5 x 2.2 x 2 cm.  Dilatation of upstream pancreatic duct up to 5 mm.  Downstream pancreatic duct appears normal.  Atrophia of pancreatic tail upstream from the hypoenhancing mass that is highly suspicious for pancreatic adenocarcinoma.  There appears to be tumor extension contacting the celiac axis.  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    Patient is here with her 2 daughters.  One of her daughter lives with her.  Patient has been feeling gradually fatigued over time.  She spends most of her day sitting and not doing much around the house.She has lost 18 pounds in the last 6 months  ECOG PS- 2  Pain scale- 4   Review of systems- Review of Systems  Constitutional:  Positive for malaise/fatigue and weight loss. Negative for chills and fever.  HENT:  Negative for congestion, ear discharge and nosebleeds.   Eyes:  Negative for blurred vision.  Respiratory:  Negative for cough,  hemoptysis, sputum production, shortness of breath and wheezing.   Cardiovascular:  Negative for chest pain, palpitations, orthopnea and claudication.  Gastrointestinal:  Positive for abdominal pain. Negative for blood in stool, constipation, diarrhea, heartburn, melena, nausea and vomiting.  Genitourinary:  Negative for dysuria, flank pain, frequency, hematuria and urgency.  Musculoskeletal:  Negative for back pain, joint pain and myalgias.  Skin:  Negative for rash.  Neurological:  Negative for dizziness, tingling, focal weakness, seizures, weakness and headaches.  Endo/Heme/Allergies:  Does not bruise/bleed easily.  Psychiatric/Behavioral:  Negative for depression and suicidal ideas. The patient does not have insomnia.     Allergies  Allergen Reactions   Statins Other (See Comments)    Muscle weakness  Other Reaction(s): Muscle Pain    Patient Active Problem List   Diagnosis Date Noted   Short-term memory loss 01/06/2020   Encounter for post surgical wound check 11/04/2019   Postop check 10/07/2019   S/P CABG x 3 09/04/2019   CAD, multiple vessel 08/27/2019   NSTEMI (non-ST elevated myocardial infarction) (HCC) 08/26/2019   Anxiety 08/26/2019   Hypothyroidism 08/26/2019   Pure hypercholesterolemia    Arthritis 07/23/2016   Morbid obesity due to excess calories (HCC) 07/21/2015   Weakness 07/21/2015   Heartburn 07/20/2013   Back pain 07/20/2013   SOB (shortness of breath) 08/29/2010   Obesity 08/29/2010   Hyperlipidemia LDL goal <70 08/29/2010     Past Medical History:  Diagnosis Date   Anxiety    Arthritis    Cancer (HCC)    UTERINE   Coronary artery disease    Depression    Dyspnea  Heart murmur    HOH (hard of hearing)    Hyperlipidemia    Hypothyroidism    Non-STEMI (non-ST elevated myocardial infarction) [facility])      Past Surgical History:  Procedure Laterality Date   APPENDECTOMY     CATARACT EXTRACTION W/PHACO Left 09/17/2017   Procedure: CATARACT  EXTRACTION PHACO AND INTRAOCULAR LENS PLACEMENT (IOC);  Surgeon: Galen Manila, MD;  Location: [facility] ORS;  Service: Ophthalmology;  Laterality: Left;  [place] 00:48 AP% 13.0 CDE 6.26 Fluid pack lot # [id number]   CATARACT EXTRACTION W/PHACO Right 10/15/2017   Procedure: CATARACT EXTRACTION PHACO AND INTRAOCULAR LENS PLACEMENT (IOC);  Surgeon: Galen Manila, MD;  Location: [facility] ORS;  Service: Ophthalmology;  Laterality: Right;  [place]  00:44 AP% 16.1 CDE 7.23 Fluid pack lot # [id number]   CHOLECYSTECTOMY     CORONARY ARTERY BYPASS GRAFT N/A 09/04/2019   Procedure: CORONARY ARTERY BYPASS GRAFTING (CABG), ON PUMP, TIMES THREE, LIMA TO LAD, SVG TO PDA, SVG TO OM 2;  Surgeon: Kerin Perna, MD;  Location: [facility] OR;  Service: Open Heart Surgery;  Laterality: N/A;   ENDOVEIN HARVEST OF GREATER SAPHENOUS VEIN Right 09/04/2019   Procedure: ENDOVEIN HARVEST OF GREATER SAPHENOUS VEIN;  Surgeon: Kerin Perna, MD;  Location: [facility] OR;  Service: Open Heart Surgery;  Laterality: Right;   LEFT HEART CATH AND CORONARY ANGIOGRAPHY N/A 08/27/2019   Procedure: LEFT HEART CATH AND CORONARY ANGIOGRAPHY;  Surgeon: Antonieta Iba, MD;  Location: [facility] INVASIVE CV LAB;  Service: Cardiovascular;  Laterality: N/A;   TEE WITHOUT CARDIOVERSION N/A 09/04/2019   Procedure: TRANSESOPHAGEAL ECHOCARDIOGRAM (TEE);  Surgeon: Donata Clay, Theron Arista, MD;  Location: [facility] OR;  Service: Open Heart Surgery;  Laterality: N/A;    Social History   Socioeconomic History   Marital status: Married    Spouse name: Not on file   Number of children: 4   Years of education: Not on file   Highest education level: Not on file  Occupational History   Occupation: retired  Tobacco Use   Smoking status: Never   Smokeless tobacco: Never  Vaping Use   Vaping status: Never Used  Substance and Sexual Activity   Alcohol use: Not Currently   Drug use: No   Sexual activity: Not on file  Other Topics Concern   Not on file  Social History Narrative   Right  handed    Lives with husband   Social Drivers of Health   Financial Resource Strain: Low Risk  (12/19/2022)   Received from [facility]   Overall Financial Resource Strain (CARDIA)    Difficulty of Paying Living Expenses: Not hard at all  Food Insecurity: No Food Insecurity (10/18/2022)   Received from [facility]   Hunger Vital Sign    Worried About Running Out of Food in the Last Year: Never true    Ran Out of Food in the Last Year: Never true  Transportation Needs: No Transportation Needs (10/18/2022)   Received from [facility] - Transportation    In the past 12 months, has lack of transportation kept you from medical appointments or from getting medications?: No    Lack of Transportation (Non-Medical): No  Physical Activity: Not on file  Stress: Not on file  Social Connections: Not on file  Intimate Partner Violence: Not on file     Family History  Problem Relation Age of Onset   Emphysema Mother    Heart disease Mother  Heart attack Father    Breast cancer Daughter      Current Outpatient Medications:    acetaminophen (TYLENOL) 500 MG tablet, Take 500 mg by mouth every 6 (six) hours as needed for moderate pain or headache., Disp: , Rfl:    aspirin EC 81 MG tablet, Take 1 tablet (81 mg total) by mouth daily. Swallow whole., Disp: 90 tablet, Rfl: 3   donepezil (ARICEPT) 5 MG tablet, Take 5 mg by mouth at bedtime., Disp: , Rfl:    escitalopram (LEXAPRO) 10 MG tablet, Take 1.5 tablets by mouth daily., Disp: , Rfl:    ezetimibe (ZETIA) 10 MG tablet, TAKE 1 TABLET BY MOUTH ONCE DAILY., Disp: 90 tablet, Rfl: 3   levothyroxine (SYNTHROID) 50 MCG tablet, Take 50 mcg by mouth daily before breakfast., Disp: , Rfl:    rosuvastatin (CRESTOR) 40 MG tablet, Take 1 tablet (40 mg total) by mouth daily., Disp: 90 tablet, Rfl: 3   Thiamine HCl (VITAMIN B-1) 250 MG tablet, Take 250 mg by mouth daily., Disp: , Rfl:     nitroGLYCERIN (NITROSTAT) 0.4 MG SL tablet, Place 1 tablet (0.4 mg total) under the tongue every 5 (five) minutes x 3 doses as needed for chest pain. (Patient not taking: Reported on 02/11/2023), Disp: 25 tablet, Rfl: 3   NON FORMULARY, 300 mg daily. CBD GUMMIES (Patient not taking: Reported on 02/11/2023), Disp: , Rfl:    polyethylene glycol (MIRALAX) 17 g packet, Take 17 g by mouth daily. (Patient not taking: Reported on 02/11/2023), Disp: 14 each, Rfl: 0   Physical exam:  Vitals:   02/11/23 1439  BP: 119/80  Pulse: 85  Resp: 18  Temp: 98.3 F (36.8 C)  TempSrc: Tympanic  SpO2: 99%  Weight: 145 lb 6.4 oz (66 kg)   Physical Exam Constitutional:      Comments: Sitting in a wheelchair.  Appears in no acute distress  Cardiovascular:     Rate and Rhythm: Normal rate and regular rhythm.     Heart sounds: Normal heart sounds.  Pulmonary:     Effort: Pulmonary effort is normal.     Breath sounds: Normal breath sounds.  Abdominal:     General: Bowel sounds are normal.     Palpations: Abdomen is soft.     Comments: Tenderness to palpation in the epigastrium  Skin:    General: Skin is warm and dry.  Neurological:     Mental Status: She is alert and oriented to person, place, and time.           Latest Ref Rng & Units 02/11/2023    3:38 PM  CMP  Glucose 70 - 99 mg/dL 161   BUN 8 - 23 mg/dL 23   Creatinine 0.96 - 1.00 mg/dL 0.45   Sodium 409 - 811 mmol/L 135   Potassium 3.5 - 5.1 mmol/L 3.8   Chloride 98 - 111 mmol/L 98   CO2 22 - 32 mmol/L 25   Calcium 8.9 - 10.3 mg/dL 91.4   Total Protein 6.5 - 8.1 g/dL 7.5   Total Bilirubin <7.8 mg/dL 1.3   Alkaline Phos 38 - 126 U/L 54   AST 15 - 41 U/L 49   ALT 0 - 44 U/L 31       Latest Ref Rng & Units 02/11/2023    3:38 PM  CBC  WBC 4.0 - 10.5 K/uL 8.3   Hemoglobin 12.0 - 15.0 g/dL 29.5   Hematocrit 62.1 - 46.0 % 45.5   Platelets  150 - 400 K/uL 197     No images are attached to the encounter.  CT ABDOMEN PELVIS W  CONTRAST Result Date: 02/06/2023 CLINICAL DATA:  Left lower quadrant abdominal pain. Epigastric pain. EXAM: CT ABDOMEN AND PELVIS WITH CONTRAST TECHNIQUE: Multidetector CT imaging of the abdomen and pelvis was performed using the standard protocol following bolus administration of intravenous contrast. RADIATION DOSE REDUCTION: This exam was performed according to the departmental dose-optimization program which includes automated exposure control, adjustment of the mA and/or kV according to patient size and/or use of iterative reconstruction technique. CONTRAST:  OMNIPAQUE IOHEXOL 300 MG/ML  SOLN COMPARISON:  Chest two views 11/02/2019, CT abdomen and pelvis without contrast 04/10/2011; CT chest 07/15/2020 FINDINGS: Lower chest: Status post median sternotomy. Mild curvilinear bibasilar subsegmental atelectasis and/or scarring. No pleural effusion. Heart size is normal. No inferior pericardial effusion. Hepatobiliary: Smooth liver contours. Status post cholecystectomy. Minimal central intrahepatic biliary ductal dilatation is similar to 04/10/2011, and the proximal common bile duct measures up to 9 mm, unchanged. This is likely due to reservoir effect following prior cholecystectomy. Pancreas: There is decreased enhancement in the mid and a portion of the upstream portion of the pancreatic body concerning for a hypoenhancing mass measuring up to 5.0 x 2.2 x 2.0 cm (transverse by AP by craniocaudal). There is dilatation of the upstream pancreatic duct up to 5 mm. The downstream pancreatic duct appears normal in caliber. There is atrophy of the pancreatic tail upstream from the hypoenhancing mass that is highly suspicious for pancreatic adenocarcinoma. This appearance of the mid to upper pancreas is new from remote 04/10/2011 noncontrast CT. There appears to be tumor extension contacting the celiac axis (coronal series 5, image 40 and axial series 2, image 21). The proximal splenic artery near the celiac axis  appears surrounded by tumor (axial series 2, images 19-21, coronal series 5 images 38 through 43). Spleen: Normal in size without focal abnormality. Adrenals/Urinary Tract: Normal adrenals. The kidneys enhance uniformly are symmetric in size without hydronephrosis. Low-attenuation right lower pole partially exophytic 1.9 cm cyst, mildly increased from 1.7 cm previously (axial series 2, image 36). Subcentimeter low-attenuation 7 mm lesion within the upper pole of the right kidney, too small to further characterize. No follow-up imaging is recommended for these renal lesions. The urinary bladder is decompressed, precluding evaluation. No ureterectasis is seen. Stomach/Bowel: Mild-to-moderate sigmoid diverticulosis without inflammatory changes to indicate acute diverticulitis, similar to prior. The terminal ileum is unremarkable. No dilated loops of bowel to indicate bowel obstruction. Possible normal thin air-filled appendix is anterior the cecum on axial series 2, image 44. No inflammatory changes are seen around the cecum to indicate secondary signs of acute appendicitis. Vascular/Lymphatic: No abdominal aortic aneurysm. Moderate atherosclerosis. The there is a filling defect within the superior mesenteric vein concerning for thrombus and occlusion (delayed axial series 7 images 14 and 15). There are increased collateral vessels within the upper abdomen. 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 Reproductive: The uterus is present.  No adnexal mass is seen. Other: No ventral abdominal wall aneurysm. There is moderate atrophy and fatty infiltration of the right rectus abdominus muscle. No pneumoperitoneum. Musculoskeletal: Within the anterior left paramidline chest wall there is a subcutaneous soft tissue nodule measuring up to 9 x 7 mm (axial series 2, image 4). Previously there was a  similar nodule in this region  measuring 7 x 5 mm on 07/15/2020. The slow change over 2 and a half years suggests a benign process. Recommend clinical correlation for possible sebaceous cyst or other lesion in this region. Large anterior T8-9 bridging osteophytes. More moderate anterior bridging osteophytosis of the rest of the lower visualized thoracic spine and upper lumbar spine. IMPRESSION: 1. Hypoenhancing mass within the mid and upstream portion of the pancreatic body measuring up to 5.0 x 2.2 x 2.0 cm. There is dilatation of the upstream pancreatic duct up to 5 mm. There is atrophy of the pancreatic tail upstream from the hypoenhancing mass. This is highly suspicious for pancreatic adenocarcinoma. There appears to be tumor extension contacting the celiac axis. The proximal splenic artery near the celiac axis appears mildly encased by tumor. 2. There is low signal within the superior mesenteric vein concerning for thrombus and occlusion. There are increased collateral vessels within the upper abdomen. 3. 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 4. Mild-to-moderate sigmoid diverticulosis without inflammatory changes to indicate acute diverticulitis, similar to prior. 5. Within the anterior left paramidline chest wall there is a subcutaneous soft tissue nodule measuring up to 9 x 7 mm. Previously there was a similar nodule in this region measuring 7 x 5 mm on 07/15/2020. The slow change over 2 and a half years suggests a benign process. Recommend clinical correlation for possible sebaceous cyst or other lesion in this region. These results will be called to the ordering clinician or representative by the Radiologist Assistant, and communication documented in the PACS or [employer]. Electronically Signed   By: Neita Garnet M.D.   On: 02/06/2023 15:47    Assessment and plan- Patient is a 82 y.o. female  with pancreatic body mass concerning for pancreatic adenocarcinoma  I have reviewed CT abdomen pelvis images independently and discussed findingsThis with the patient which shows a 5 cm hypoenhancing mass in pancreatic body along with involvement of celiac axis highly concerning for pancreatic adenocarcinoma.  Discussed differences between resectable, borderline resectable and unresectable pancreatic cancer.  I am getting a PET CT scan to complete her staging workup.  She will also need EUS for definitive tissue diagnosis.  I am checking CA 19-9 today.  Based on her CT scan, her pancreatic cancer appears atleast borderline resectable if not unresectable.  Moreover given patient's age and performance status I do not think she is a good candidate for surgery even down the line.  I am referring her to pancreaticobiliary at [facility] nevertheless for surgical opinion.  I will tentatively see her back in 2 to 3 weeks time after EUS results and PET/CT scan results are back to discuss further management.  If she is deemed unresectable I will discuss palliative chemotherapy with gemcitabine Abraxane with her.  Patient is interested in getting further workup and treatment at this time  Her labs today are indicative of mild AKI and mildly elevated calcium of 10.4 which I will follow-up again in 2 weeks time.  Bilirubin is also mildly elevated at 1.3.   Thank you for this kind referral and the opportunity to participate in the care of this patient   Visit Diagnosis 1. Pancreatic mass     Dr. Owens Shark, MD, MPH [facility] [phone] 02/15/2023

## 2023-02-17 ENCOUNTER — Emergency Department: Payer: PPO

## 2023-02-17 ENCOUNTER — Other Ambulatory Visit: Payer: Self-pay

## 2023-02-17 ENCOUNTER — Encounter: Payer: Self-pay | Admitting: *Deleted

## 2023-02-17 DIAGNOSIS — R531 Weakness: Secondary | ICD-10-CM | POA: Insufficient documentation

## 2023-02-17 DIAGNOSIS — E039 Hypothyroidism, unspecified: Secondary | ICD-10-CM | POA: Insufficient documentation

## 2023-02-17 DIAGNOSIS — Z951 Presence of aortocoronary bypass graft: Secondary | ICD-10-CM | POA: Diagnosis not present

## 2023-02-17 DIAGNOSIS — I10 Essential (primary) hypertension: Secondary | ICD-10-CM | POA: Insufficient documentation

## 2023-02-17 DIAGNOSIS — I2510 Atherosclerotic heart disease of native coronary artery without angina pectoris: Secondary | ICD-10-CM | POA: Insufficient documentation

## 2023-02-17 LAB — BASIC METABOLIC PANEL
Anion gap: 13 (ref 5–15)
BUN: 22 mg/dL (ref 8–23)
CO2: 23 mmol/L (ref 22–32)
Calcium: 9.1 mg/dL (ref 8.9–10.3)
Chloride: 100 mmol/L (ref 98–111)
Creatinine, Ser: 1.31 mg/dL — ABNORMAL HIGH (ref 0.44–1.00)
GFR, Estimated: 40 mL/min — ABNORMAL LOW (ref >60)
Glucose, Bld: 158 mg/dL — ABNORMAL HIGH (ref 70–99)
Potassium: 3.1 mmol/L — ABNORMAL LOW (ref 3.5–5.1)
Sodium: 136 mmol/L (ref 135–145)

## 2023-02-17 LAB — CBC
HCT: 42.6 % (ref 36.0–46.0)
Hemoglobin: 14.5 g/dL (ref 12.0–15.0)
MCH: 31.3 pg (ref 26.0–34.0)
MCHC: 34 g/dL (ref 30.0–36.0)
MCV: 92 fL (ref 80.0–100.0)
Platelets: 141 10*3/uL — ABNORMAL LOW (ref 150–400)
RBC: 4.63 MIL/uL (ref 3.87–5.11)
RDW: 12.7 % (ref 11.5–15.5)
WBC: 4.5 10*3/uL (ref 4.0–10.5)
nRBC: 0 % (ref 0.0–0.2)

## 2023-02-17 LAB — TROPONIN I (HIGH SENSITIVITY): Troponin I (High Sensitivity): 87 ng/L — ABNORMAL HIGH (ref <18)

## 2023-02-17 NOTE — ED Provider Triage Note (Signed)
Emergency Medicine Provider Triage Evaluation Note  Bonnie Hancock , a 82 y.o. female  was evaluated in triage.  Pt complains of weakness for 2 days.  Review of Systems  Positive: weakness Negative: Cp, headache  Physical Exam  Ht 5\' 2"  (1.575 m)   Wt 66 kg   LMP  (LMP Unknown)   BMI 26.61 kg/m  Gen:   Awake, no distress   Resp:  Normal effort  MSK:   Moves extremities without difficulty  Other:    Medical Decision Making  Medically screening exam initiated at 7:51 PM.  Appropriate orders placed.  Bonnie Hancock was informed that the remainder of the evaluation will be completed by another provider, this initial triage assessment does not replace that evaluation, and the importance of remaining in the ED until their evaluation is complete.     Cameron Ali, PA-C 02/17/23 1952

## 2023-02-17 NOTE — ED Triage Notes (Signed)
Pt brought in via ems from home with weakness.  Near syncopal episode yesterday.  Pt has abrasion to right forearm from the fall.  Hx dementia.  Pt alert   iv in place.  Pt denies chest pain.  No headache.

## 2023-02-18 ENCOUNTER — Ambulatory Visit: Admission: RE | Admit: 2023-02-18 | Payer: PPO | Source: Ambulatory Visit

## 2023-02-18 ENCOUNTER — Emergency Department
Admission: EM | Admit: 2023-02-18 | Discharge: 2023-02-18 | Disposition: A | Discharge status: Home / Self Care | Payer: PPO | Attending: Emergency Medicine | Admitting: Emergency Medicine

## 2023-02-18 DIAGNOSIS — R531 Weakness: Secondary | ICD-10-CM

## 2023-02-18 LAB — TROPONIN I (HIGH SENSITIVITY): Troponin I (High Sensitivity): 94 ng/L — ABNORMAL HIGH (ref <18)

## 2023-02-18 MED ORDER — POTASSIUM CHLORIDE CRYS ER 20 MEQ PO TBCR
40.0000 meq | EXTENDED_RELEASE_TABLET | Freq: Once | ORAL | Status: AC
  Administered 2023-02-18: 40 meq via ORAL
  Filled 2023-02-18: qty 2

## 2023-02-18 NOTE — ED Provider Notes (Signed)
Bonnie Hancock [facility] Provider Note    Event Date/Time   First MD Initiated Contact with Patient 02/18/23 0825     (approximate)   History   Chief Complaint Weakness   HPI  Bonnie Hancock is a 82 y.o. female with past medical history of hypertension, hyperlipidemia, CAD status post CABG, and hypothyroidism who presents to the ED complaining of weakness.  Majority of history is obtained from patient's daughter at bedside, who states that patient has seemed slightly weaker than usual over the past couple of days.  Patient denies any pain and has not had any difficulty breathing, EMS had reported that she had a near syncopal episode yesterday, but daughter denies this.  She has not had any fever, cough, difficulty breathing, nausea, vomiting, diarrhea, or dysuria.  She does have a history of mild cognitive impairment, but daughter reports that she is at her baseline mental status.  Patient currently denies any complaints and is not sure why she is here.     Physical Exam   Triage Vital Signs: ED Triage Vitals  Encounter Vitals Group     BP 02/18/23 0128 (!) 107/52     Systolic BP Percentile --      Diastolic BP Percentile --      Pulse Rate 02/18/23 0128 73     Resp 02/18/23 0128 16     Temp 02/18/23 0128 99.1 F (37.3 C)     Temp src --      SpO2 02/18/23 0128 96 %     Weight 02/17/23 1943 145 lb 8.1 oz (66 kg)     Height 02/17/23 1943 5\' 2"  (1.575 m)     Head Circumference --      Peak Flow --      Pain Score 02/17/23 1943 0     Pain Loc --      Pain Education --      Exclude from Growth Chart --     Most recent vital signs: Vitals:   02/18/23 0128 02/18/23 0630  BP: (!) 107/52 110/64  Pulse: 73 74  Resp: 16 18  Temp: 99.1 F (37.3 C) 97.9 F (36.6 C)  SpO2: 96% 95%    Constitutional: Alert and oriented to person, place, and time, but not situation. Eyes: Conjunctivae are normal. Head: Atraumatic. Nose: No congestion/rhinnorhea. Mouth/Throat:  Mucous membranes are moist.  Cardiovascular: Normal rate, regular rhythm. Grossly normal heart sounds.  2+ radial pulses bilaterally. Respiratory: Normal respiratory effort.  No retractions. Lungs CTAB. Gastrointestinal: Soft and nontender. No distention. Musculoskeletal: No lower extremity tenderness nor edema.  Neurologic:  Normal speech and language. No gross focal neurologic deficits are appreciated.    ED Results / Procedures / Treatments   Labs (all labs ordered are listed, but only abnormal results are displayed) Labs Reviewed  BASIC METABOLIC PANEL - Abnormal; Notable for the following components:      Result Value   Potassium 3.1 (*)    Glucose, Bld 158 (*)    Creatinine, Ser 1.31 (*)    GFR, Estimated 40 (*)    All other components within normal limits  CBC - Abnormal; Notable for the following components:   Platelets 141 (*)    All other components within normal limits  TROPONIN I (HIGH SENSITIVITY) - Abnormal; Notable for the following components:   Troponin I (High Sensitivity) 87 (*)    All other components within normal limits  TROPONIN I (HIGH SENSITIVITY) - Abnormal; Notable for the following  components:   Troponin I (High Sensitivity) 94 (*)    All other components within normal limits     EKG  ED ECG REPORT I, Chesley Noon, the attending physician, personally viewed and interpreted this ECG.   Date: 02/18/2023  EKG Time: 19:45  Rate: 73  Rhythm: normal sinus rhythm  Axis: Normal  Intervals:none  ST&T Change: None  RADIOLOGY Chest x-ray reviewed and interpreted by me with no infiltrate, edema, or effusion.  PROCEDURES:  Critical Care performed: No  Procedures   MEDICATIONS ORDERED IN ED: Medications  potassium chloride SA (KLOR-CON M) CR tablet 40 mEq (40 mEq Oral Given 02/18/23 0931)     IMPRESSION / MDM / ASSESSMENT AND PLAN / ED COURSE  I reviewed the triage vital signs and the nursing notes.                              82  y.o. female with past medical history of hypertension, hyperlipidemia, CAD status post CABG, and hypothyroidism who presents to the ED for generalized weakness over the past couple of days.  Patient's presentation is most consistent with acute presentation with potential threat to life or bodily function.  Differential diagnosis includes, but is not limited to, arrhythmia, ACS, pneumonia, anemia, electrolyte abnormality, AKI.  Patient nontoxic-appearing and in no acute distress, vital signs are unremarkable.  Patient currently denies any complaints and appears to be at her baseline mental status.  EKG shows no evidence of arrhythmia or ischemia, troponin elevated but stable on recheck, patient denies any symptoms concerning for ACS or PE.  While EMS had reported near syncopal episode, daughter denies any recent lightheadedness or near syncope, states only patient seemed generally weak.  She has no focal neurologic deficits and no findings to suggest stroke.  Renal function stable compared to previous without acute electrolyte abnormality beyond mild hypokalemia.  No significant anemia or leukocytosis noted.  Patient with recently diagnosed pancreatic mass, has appointment to establish care at [facility] tomorrow and family is eager to keep this appointment.  Patient able to ambulate here in the ED without difficulty, appropriate for discharge home with outpatient follow-up.  Patient and daughter counseled to return to the ED for new or worsening symptoms.  Daughter agrees with plan.      FINAL CLINICAL IMPRESSION(S) / ED DIAGNOSES   Final diagnoses:  Generalized weakness     Rx / DC Orders   ED Discharge Orders     None        Note:  This document was prepared using Dragon voice recognition software and may include unintentional dictation errors.   Chesley Noon, MD 02/18/23 [phone]

## 2023-02-18 NOTE — ED Notes (Signed)
Patient able to ambulate in hall with walker

## 2023-02-19 ENCOUNTER — Telehealth: Payer: Self-pay

## 2023-02-19 NOTE — Telephone Encounter (Signed)
 Spoke with daughter, Marval, regarding missed PET scan due to being in the ED. PET will reach out to have this rescheduled. EUS at [facility] is still pending scheduling. Instructed her to make Dr. Zani aware that this was ordered for biopsy on 12/23 and scheduling is pending. She will see Dr. Zani this afternoon. Reminded of follow up on 1/14 with Dr. Melanee to go over results of PET and biopsy. Encouraged to call with any questions or needs in the meanwhile.

## 2023-02-22 ENCOUNTER — Telehealth: Payer: Self-pay

## 2023-02-22 NOTE — Telephone Encounter (Signed)
 Called and spoke to daughter Eunice Blase. Following appointment with Dr. Kathrynn Ducking, hospice was arranged. Hospice has been out and they have no further needs at this time. Appointments for [place] cancelled.

## 2023-02-25 ENCOUNTER — Ambulatory Visit: Payer: PPO

## 2023-03-05 ENCOUNTER — Ambulatory Visit: Payer: PPO | Admitting: Oncology

## 2023-03-05 ENCOUNTER — Other Ambulatory Visit: Payer: PPO

## 2023-03-23 DEATH — deceased
